# Patient Record
Sex: Male | Born: 1960 | Race: White | Hispanic: Yes | Marital: Married | Smoking: Former smoker
Health system: Southern US, Community
[De-identification: ages and names within clinical notes are randomized; demographics above are authoritative.]

## PROBLEM LIST (undated history)

## (undated) DIAGNOSIS — R519 Headache, unspecified: Secondary | ICD-10-CM

## (undated) DIAGNOSIS — E782 Mixed hyperlipidemia: Secondary | ICD-10-CM

## (undated) DIAGNOSIS — J189 Pneumonia, unspecified organism: Secondary | ICD-10-CM

## (undated) DIAGNOSIS — G8929 Other chronic pain: Secondary | ICD-10-CM

## (undated) DIAGNOSIS — I2510 Atherosclerotic heart disease of native coronary artery without angina pectoris: Secondary | ICD-10-CM

## (undated) DIAGNOSIS — H269 Unspecified cataract: Secondary | ICD-10-CM

## (undated) DIAGNOSIS — M549 Dorsalgia, unspecified: Secondary | ICD-10-CM

## (undated) DIAGNOSIS — R51 Headache: Secondary | ICD-10-CM

## (undated) DIAGNOSIS — Z992 Dependence on renal dialysis: Secondary | ICD-10-CM

## (undated) DIAGNOSIS — I6529 Occlusion and stenosis of unspecified carotid artery: Secondary | ICD-10-CM

## (undated) DIAGNOSIS — N186 End stage renal disease: Secondary | ICD-10-CM

## (undated) DIAGNOSIS — E119 Type 2 diabetes mellitus without complications: Secondary | ICD-10-CM

## (undated) DIAGNOSIS — I10 Essential (primary) hypertension: Secondary | ICD-10-CM

## (undated) HISTORY — DX: Unspecified cataract: H26.9

## (undated) HISTORY — DX: Essential (primary) hypertension: I10

## (undated) HISTORY — DX: Mixed hyperlipidemia: E78.2

## (undated) HISTORY — DX: Atherosclerotic heart disease of native coronary artery without angina pectoris: I25.10

## (undated) HISTORY — DX: Occlusion and stenosis of unspecified carotid artery: I65.29

## (undated) HISTORY — PX: CATARACT EXTRACTION W/ INTRAOCULAR LENS IMPLANT: SHX1309

---

## 2003-01-21 ENCOUNTER — Encounter: Admission: RE | Admit: 2003-01-21 | Discharge: 2003-04-21 | Payer: Self-pay | Admitting: Family Medicine

## 2004-01-20 ENCOUNTER — Inpatient Hospital Stay (HOSPITAL_COMMUNITY): Admission: EM | Admit: 2004-01-20 | Discharge: 2004-01-22 | Payer: Self-pay | Admitting: Emergency Medicine

## 2004-02-04 ENCOUNTER — Ambulatory Visit: Payer: Self-pay | Admitting: Internal Medicine

## 2004-06-17 ENCOUNTER — Emergency Department (HOSPITAL_COMMUNITY): Admission: EM | Admit: 2004-06-17 | Discharge: 2004-06-17 | Payer: Self-pay | Admitting: Emergency Medicine

## 2004-07-27 ENCOUNTER — Ambulatory Visit: Payer: Self-pay | Admitting: Internal Medicine

## 2004-09-06 ENCOUNTER — Ambulatory Visit (HOSPITAL_COMMUNITY): Admission: RE | Admit: 2004-09-06 | Discharge: 2004-09-07 | Payer: Self-pay | Admitting: Ophthalmology

## 2004-11-04 ENCOUNTER — Ambulatory Visit: Payer: Self-pay | Admitting: Internal Medicine

## 2005-06-05 HISTORY — PX: EYE SURGERY: SHX253

## 2009-01-13 ENCOUNTER — Emergency Department (HOSPITAL_COMMUNITY): Admission: EM | Admit: 2009-01-13 | Discharge: 2009-01-13 | Payer: Self-pay | Admitting: Family Medicine

## 2010-09-10 LAB — CBC
MCHC: 34.4 g/dL (ref 30.0–36.0)
RBC: 4.61 MIL/uL (ref 4.22–5.81)
RDW: 12.9 % (ref 11.5–15.5)
WBC: 7.4 10*3/uL (ref 4.0–10.5)

## 2010-09-10 LAB — GLUCOSE, CAPILLARY

## 2010-09-10 LAB — DIFFERENTIAL
Basophils Absolute: 0 10*3/uL (ref 0.0–0.1)
Basophils Relative: 0 % (ref 0–1)
Neutro Abs: 4.4 10*3/uL (ref 1.7–7.7)

## 2010-09-10 LAB — POCT I-STAT, CHEM 8
Calcium, Ion: 1.16 mmol/L (ref 1.12–1.32)
HCT: 45 % (ref 39.0–52.0)
Hemoglobin: 15.3 g/dL (ref 13.0–17.0)
Potassium: 5.2 mEq/L — ABNORMAL HIGH (ref 3.5–5.1)
Sodium: 130 mEq/L — ABNORMAL LOW (ref 135–145)

## 2010-10-21 NOTE — Op Note (Signed)
[name], Stephen Lane             ACCOUNT NO.:  [id number]   MEDICAL RECORD NO.:  [id number]          PATIENT TYPE:  OIB   LOCATION:  2550                         FACILITY:  [facility]   PHYSICIAN:  Chrystie Nose. Zigmund Daniel, M.D. 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   PROCEDURES:  Pars plana vitrectomy with membrane peel, retinal  photocoagulation and gas injection, right eye.   SURGEON:  Dr.  Tempie Hoist.   ASSISTANT:  Albert L. Lundquist, P.A.   ANESTHESIA:  General.   DETAILS:  Usual prep and drape.  Peritomies at 8, 10 and 2 o'clock. The 4 mm  angled infusion port anchored into place at 8 o'clock. The lighted pick and  the cutter placed  at 10 and 2 o'clock respectively.  Provisc placed on the  corneal surface. The Biom viewing system moved into place. The pars plana  vitrectomy was begun just behind the pseudophakos. Large white, thick  membranes consistent with old vitreous hemorrhage were encountered.  Membranes were carefully removed layer by layer until the retina became  visible.  Dark liquefied blood was seen covering the retinal surface.  This  was vacuumed with the [employer] [facility] brush and the vitreous cutter. The  vitrectomy was carried out into the peripheral vitreous area down to the  vitreous base for 360 degrees.   The macular area came into view and it was very distorted. It was elevated  and pulled into tight strands with several foci of vitreoretinal traction.  The macula was detached and the white fibrous scar tissue was pulling  traction detachment of the disk and the macular region.  These membranes  were carefully peeled and trimmed down to their stumps, in order to allow  the macula and the retina to lie back in place. The Endo-laser was then  positioned in the eye.  Then 607  burns were placed around the retinal  periphery with a power of 1000 milliwatts, 1000 microns each and 0.1  seconds. Washout procedure was performed and additional [employer] [facility]  brush vacuuming procedure was performed. Sterile air was infused in the eye  to reinflate the globe.   The instruments were removed from the eye and 9-0 nylon was used to close  the sclerotomy sites. They were tested and found to be tight. The  conjunctiva was reposited with 7-0 chromic suture. Polymyxin and gentamicin  were irrigated into Tenon's space. Atropine solution was applied.  Marcaine  was injected around the globe for postop pain. TobraDex, a patch and shield  were placed. The closing tension was 10 mm with the [place] tonometer.  The patient was awakened, taken to recovery in satisfactory condition.  Complications:  None. Operative time: 2 hours.     JDM/MEDQ  D:  09/06/2004  T:  09/06/2004  Job:  [id number]

## 2010-10-21 NOTE — Discharge Summary (Signed)
Stephen Lane, Stephen Lane                    ACCOUNT NO.:  [id number]   MEDICAL RECORD NO.:  [id number]                   PATIENT TYPE:  INP   LOCATION:  5010                                 FACILITY:  [facility]   PHYSICIAN:  Jay Schlichter, M.D.               DATE OF BIRTH:  03-Jul-1960   DATE OF ADMISSION:  01/20/2004  DATE OF DISCHARGE:  01/22/2004                                 DISCHARGE SUMMARY   DISCHARGE MEDICATIONS:  1. Glucotrol 10 mg q.a.m.  2. Protonix 40 mg q.a.m.  3. Maalox 15 mL after each meal.   MEDICAL PROBLEMS:  1. Diabetes mellitus.  2. Acute renal failure.  3. Dehydration.  4. Hypertension.  5. Decreased vision acuity.  6. Hyperproteinemia.   PROCEDURES:  EKG was normal.   PERTINENT LABORATORIES:  On admission his sodium was 130, potassium 4.7,  chloride 83, bicarbonate 28, BUN 16, creatinine 3.9, sugar 643.  His total  protein was elevated 9.6.  His HBA1C was 10.5.  Urine sodium was less than  10.  urinalysis was negative.   HISTORY AND PHYSICAL:  This is a 50 year old Spanish male patient presenting  to ED for complaint of nausea, vomiting, increased fatigue for one week  worsened since last night.  Complained of polyuria and nocturia since one  week.  No burning.  No pus or blood in urine.  Complained of epigastric pain  on eating associated with burning feeling.  Had stopped taking his medicines  since three months.  He was on Metformin because he ran out of it and did  not find time to buy it.  No complaint of chest pain, shortness of breath,  fevers, diarrhea, cough.   DISCHARGE LABORATORIES:  Sodium 134, potassium 3.8, chloride 101,  bicarbonate 28, sugar 165, BUN 10, creatinine 0.8, calcium 8.7.  CBC:  Hemoglobin 14.5, hematocrit 40.8, WBC 5.9, platelets 222.   ASSESSMENT/PLAN:  1. During the hospital stay, the patient first had hyperglycemia.  This was     due to uncontrolled diabetes mellitus.  The patient had stopped taking     Glucophage  three months before admission.  The patient was treated with     sliding scale of insulin and Glucotrol 10 mg was started in the hospital.     Plan is to continue him on 10 mg of Glucotrol, follow him up within one     week.  The patient is supposed to monitor his sugar in morning and before     bedtime.  He gets a new instrument to measure his sugar.  2. Acute renal failure.  This was secondary to his polyuria and vomiting.     The patient was treated with aggressive rehydration.  The patient quickly     recovered back to normal.  On discharge his BUN and creatinine were back     to normal.  3. Burning epigastric pain.  This is due to gastritis secondary to vomiting  which was treated with Protonix 40 mg     to be continued and Maalox 15 mL after each meal as required.  The     patient is to follow up in clinic on August 24 at 2 p.m. to see Dr. Caprice Kluver.  He is also to go for outpatient center clinic education classes     which he has requested and arrangement has been made for that.      Caprice Kluver, MD                           Jay Schlichter, M.D.    SP/MEDQ  D:  01/22/2004  T:  01/23/2004  Job:  [id number]

## 2012-04-13 ENCOUNTER — Encounter (HOSPITAL_COMMUNITY): Payer: Self-pay | Admitting: Physical Medicine and Rehabilitation

## 2012-04-13 ENCOUNTER — Emergency Department (HOSPITAL_COMMUNITY): Payer: Medicaid Other

## 2012-04-13 ENCOUNTER — Inpatient Hospital Stay (HOSPITAL_COMMUNITY)
Admission: EM | Admit: 2012-04-13 | Discharge: 2012-04-17 | DRG: 247 | Disposition: A | Discharge status: Home / Self Care | Payer: Medicaid Other | Attending: Internal Medicine | Admitting: Internal Medicine

## 2012-04-13 DIAGNOSIS — R51 Headache: Secondary | ICD-10-CM

## 2012-04-13 DIAGNOSIS — K219 Gastro-esophageal reflux disease without esophagitis: Secondary | ICD-10-CM

## 2012-04-13 DIAGNOSIS — D649 Anemia, unspecified: Secondary | ICD-10-CM

## 2012-04-13 DIAGNOSIS — R9431 Abnormal electrocardiogram [ECG] [EKG]: Secondary | ICD-10-CM

## 2012-04-13 DIAGNOSIS — Z23 Encounter for immunization: Secondary | ICD-10-CM

## 2012-04-13 DIAGNOSIS — R739 Hyperglycemia, unspecified: Secondary | ICD-10-CM

## 2012-04-13 DIAGNOSIS — I2510 Atherosclerotic heart disease of native coronary artery without angina pectoris: Principal | ICD-10-CM | POA: Diagnosis present

## 2012-04-13 DIAGNOSIS — Z955 Presence of coronary angioplasty implant and graft: Secondary | ICD-10-CM

## 2012-04-13 DIAGNOSIS — IMO0001 Reserved for inherently not codable concepts without codable children: Secondary | ICD-10-CM | POA: Diagnosis present

## 2012-04-13 DIAGNOSIS — R079 Chest pain, unspecified: Secondary | ICD-10-CM

## 2012-04-13 DIAGNOSIS — E119 Type 2 diabetes mellitus without complications: Secondary | ICD-10-CM

## 2012-04-13 DIAGNOSIS — E785 Hyperlipidemia, unspecified: Secondary | ICD-10-CM

## 2012-04-13 DIAGNOSIS — I10 Essential (primary) hypertension: Secondary | ICD-10-CM | POA: Diagnosis present

## 2012-04-13 DIAGNOSIS — I200 Unstable angina: Secondary | ICD-10-CM

## 2012-04-13 DIAGNOSIS — E781 Pure hyperglyceridemia: Secondary | ICD-10-CM | POA: Diagnosis present

## 2012-04-13 LAB — COMPREHENSIVE METABOLIC PANEL
ALT: 17 U/L (ref 0–53)
Alkaline Phosphatase: 145 U/L — ABNORMAL HIGH (ref 39–117)
Calcium: 8.7 mg/dL (ref 8.4–10.5)
Creatinine, Ser: 1.28 mg/dL (ref 0.50–1.35)
Sodium: 129 mEq/L — ABNORMAL LOW (ref 135–145)
Total Bilirubin: 0.3 mg/dL (ref 0.3–1.2)

## 2012-04-13 LAB — CBC WITH DIFFERENTIAL/PLATELET
Basophils Absolute: 0 10*3/uL (ref 0.0–0.1)
Basophils Relative: 1 % (ref 0–1)
Lymphs Abs: 2.5 10*3/uL (ref 0.7–4.0)
MCH: 31.1 pg (ref 26.0–34.0)
MCV: 86 fL (ref 78.0–100.0)
Monocytes Absolute: 0.3 10*3/uL (ref 0.1–1.0)
Monocytes Relative: 4 % (ref 3–12)
Neutro Abs: 3.7 10*3/uL (ref 1.7–7.7)
Neutrophils Relative %: 53 % (ref 43–77)
RBC: 4.08 MIL/uL — ABNORMAL LOW (ref 4.22–5.81)
WBC: 7 10*3/uL (ref 4.0–10.5)

## 2012-04-13 LAB — POCT I-STAT TROPONIN I: Troponin i, poc: 0.07 ng/mL (ref 0.00–0.08)

## 2012-04-13 LAB — RETICULOCYTES
RBC.: 3.79 MIL/uL — ABNORMAL LOW (ref 4.22–5.81)
Retic Ct Pct: 1.5 % (ref 0.4–3.1)

## 2012-04-13 MED ORDER — ASPIRIN 300 MG RE SUPP
300.0000 mg | RECTAL | Status: AC
  Filled 2012-04-13: qty 1

## 2012-04-13 MED ORDER — DIPHENHYDRAMINE HCL 50 MG/ML IJ SOLN
25.0000 mg | Freq: Once | INTRAMUSCULAR | Status: AC
  Administered 2012-04-13: 25 mg via INTRAVENOUS
  Filled 2012-04-13: qty 1

## 2012-04-13 MED ORDER — SODIUM CHLORIDE 0.9 % IJ SOLN: 3.0000 mL | INTRAMUSCULAR | Status: DC | PRN

## 2012-04-13 MED ORDER — NITROGLYCERIN 2 % TD OINT
0.5000 [in_us] | TOPICAL_OINTMENT | Freq: Three times a day (TID) | TRANSDERMAL | Status: DC
  Administered 2012-04-13 – 2012-04-14 (×2): 0.5 [in_us] via TOPICAL
  Filled 2012-04-13: qty 30

## 2012-04-13 MED ORDER — METOCLOPRAMIDE HCL 5 MG/ML IJ SOLN
10.0000 mg | Freq: Once | INTRAMUSCULAR | Status: AC
  Administered 2012-04-13: 10 mg via INTRAVENOUS
  Filled 2012-04-13: qty 2

## 2012-04-13 MED ORDER — NITROGLYCERIN 0.4 MG SL SUBL: 0.4000 mg | SUBLINGUAL_TABLET | SUBLINGUAL | Status: DC | PRN

## 2012-04-13 MED ORDER — SODIUM CHLORIDE 0.9 % IJ SOLN
3.0000 mL | Freq: Two times a day (BID) | INTRAMUSCULAR | Status: DC
  Administered 2012-04-14 – 2012-04-16 (×4): 3 mL via INTRAVENOUS

## 2012-04-13 MED ORDER — ONDANSETRON HCL 4 MG/2ML IJ SOLN: 4.0000 mg | Freq: Four times a day (QID) | INTRAMUSCULAR | Status: DC | PRN

## 2012-04-13 MED ORDER — SODIUM CHLORIDE 0.9 % IV SOLN: 250.0000 mL | INTRAVENOUS | Status: DC | PRN

## 2012-04-13 MED ORDER — ATORVASTATIN CALCIUM 20 MG PO TABS
20.0000 mg | ORAL_TABLET | Freq: Every day | ORAL | Status: DC
  Administered 2012-04-14 – 2012-04-16 (×3): 20 mg via ORAL
  Filled 2012-04-13 (×5): qty 1

## 2012-04-13 MED ORDER — SODIUM CHLORIDE 0.9 % IV BOLUS (SEPSIS)
1000.0000 mL | Freq: Once | INTRAVENOUS | Status: AC
  Administered 2012-04-13: 1000 mL via INTRAVENOUS

## 2012-04-13 MED ORDER — ASPIRIN 81 MG PO CHEW
324.0000 mg | CHEWABLE_TABLET | Freq: Once | ORAL | Status: AC
  Administered 2012-04-13: 324 mg via ORAL
  Filled 2012-04-13: qty 4

## 2012-04-13 MED ORDER — ASPIRIN 81 MG PO CHEW: 324.0000 mg | CHEWABLE_TABLET | ORAL | Status: AC

## 2012-04-13 MED ORDER — SODIUM CHLORIDE 0.9 % IV SOLN
INTRAVENOUS | Status: AC
  Administered 2012-04-13: 23:00:00 via INTRAVENOUS

## 2012-04-13 MED ORDER — INSULIN ASPART 100 UNIT/ML ~~LOC~~ SOLN
5.0000 [IU] | Freq: Once | SUBCUTANEOUS | Status: AC
  Administered 2012-04-13: 5 [IU] via INTRAVENOUS
  Filled 2012-04-13: qty 1

## 2012-04-13 MED ORDER — INSULIN ASPART 100 UNIT/ML ~~LOC~~ SOLN
0.0000 [IU] | Freq: Three times a day (TID) | SUBCUTANEOUS | Status: DC
  Administered 2012-04-14: 5 [IU] via SUBCUTANEOUS
  Administered 2012-04-14: 3 [IU] via SUBCUTANEOUS
  Administered 2012-04-15: 2 [IU] via SUBCUTANEOUS
  Administered 2012-04-15: 3 [IU] via SUBCUTANEOUS
  Administered 2012-04-16 (×2): 1 [IU] via SUBCUTANEOUS
  Administered 2012-04-16: 2 [IU] via SUBCUTANEOUS

## 2012-04-13 MED ORDER — ASPIRIN EC 81 MG PO TBEC
81.0000 mg | DELAYED_RELEASE_TABLET | Freq: Every day | ORAL | Status: DC
  Administered 2012-04-14 – 2012-04-17 (×3): 81 mg via ORAL
  Filled 2012-04-13 (×4): qty 1

## 2012-04-13 MED ORDER — ACETAMINOPHEN 325 MG PO TABS
650.0000 mg | ORAL_TABLET | ORAL | Status: DC | PRN
  Administered 2012-04-13 – 2012-04-14 (×3): 650 mg via ORAL
  Filled 2012-04-13 (×2): qty 2

## 2012-04-13 MED ORDER — METOPROLOL TARTRATE 12.5 MG HALF TABLET
12.5000 mg | ORAL_TABLET | Freq: Two times a day (BID) | ORAL | Status: DC
  Administered 2012-04-13 – 2012-04-16 (×7): 12.5 mg via ORAL
  Filled 2012-04-13 (×10): qty 1

## 2012-04-13 MED ORDER — KETOROLAC TROMETHAMINE 30 MG/ML IJ SOLN
30.0000 mg | Freq: Once | INTRAMUSCULAR | Status: AC
  Administered 2012-04-13: 30 mg via INTRAVENOUS
  Filled 2012-04-13: qty 1

## 2012-04-13 MED ORDER — ENOXAPARIN SODIUM 80 MG/0.8ML ~~LOC~~ SOLN
70.0000 mg | Freq: Two times a day (BID) | SUBCUTANEOUS | Status: DC
  Administered 2012-04-14: 70 mg via SUBCUTANEOUS
  Filled 2012-04-13 (×3): qty 0.8

## 2012-04-13 NOTE — ED Notes (Signed)
Pt presents to department for evaluation of diffuse chest pain and headache. Ongoing since Wednesday. Pt states pain has progressively become worse since. 7/10 pain at the time. Respirations unlabored. He is conscious alert and oriented x4. Speaks some english.

## 2012-04-13 NOTE — Consult Note (Signed)
Reason for Consult: Chest pain  Referring Physician: Derrill Kay, [facility]  [address] Quincy Simmonds is an 51 y.o. male.   HPI: 51 y/o male with a PMH of HTN, DM and gastritis who is currently been seen in consultation with Dr. Shanon Brow for evaluation of chest pain.  The patient is Spanish speaking and majority of history was translated by English-speaking family members. He complains of 1 week history of intermittent exertional chest pain that he describes as a burning pain that typically occurs with activities like moving branches of trees.  The pain usually last about 15 minutes before spontaneous relief.  He denies radiation, shortness of breath, diaphoresis, nausea or vomiting.  He also denies PND, orthopnea, palpitation or tobacco use.  In the ED, his ECG showed sinus rhythm (79bpm), LVH by voltage criteria and T-wave inversion in leads III, aVF, V3-V6 which are new when compared to prior ECG from April of 2006. His first set of cardiac marker is negative, and the patient is currently chest pain free.     Past Medical History  Diagnosis Date  . Diabetes mellitus without complication     No past surgical history on file.  History reviewed. No pertinent family history.  Social History:  reports that he has never smoked. He does not have any smokeless tobacco history on file. He reports that he does not drink alcohol or use illicit drugs.  Allergies: No Known Allergies  Medications: From 2012 D/C summary  1. Glucotrol 10 mg q am 2. Protonix 40 mg q am    Results for orders placed during the hospital encounter of 04/13/12 (from the past 48 hour(s))  CBC WITH DIFFERENTIAL     Status: Abnormal   Collection Time   04/13/12  4:20 PM      Component Value Range Comment   WBC 7.0  4.0 - 10.5 K/uL    RBC 4.08 (*) 4.22 - 5.81 MIL/uL    Hemoglobin 12.7 (*) 13.0 - 17.0 g/dL    HCT 35.1 (*) 39.0 - 52.0 %    MCV 86.0  78.0 - 100.0 fL    MCH 31.1  26.0 - 34.0 pg    MCHC 36.2 (*) 30.0 -  36.0 g/dL    RDW 12.7  11.5 - 15.5 %    Platelets 264  150 - 400 K/uL    Neutrophils Relative 53  43 - 77 %    Neutro Abs 3.7  1.7 - 7.7 K/uL    Lymphocytes Relative 36  12 - 46 %    Lymphs Abs 2.5  0.7 - 4.0 K/uL    Monocytes Relative 4  3 - 12 %    Monocytes Absolute 0.3  0.1 - 1.0 K/uL    Eosinophils Relative 6 (*) 0 - 5 %    Eosinophils Absolute 0.4  0.0 - 0.7 K/uL    Basophils Relative 1  0 - 1 %    Basophils Absolute 0.0  0.0 - 0.1 K/uL   COMPREHENSIVE METABOLIC PANEL     Status: Abnormal   Collection Time   04/13/12  4:20 PM      Component Value Range Comment   Sodium 129 (*) 135 - 145 mEq/L    Potassium 3.9  3.5 - 5.1 mEq/L HEMOLYSIS AT THIS LEVEL MAY AFFECT RESULT   Chloride 92 (*) 96 - 112 mEq/L    CO2 29  19 - 32 mEq/L    Glucose, Bld 487 (*) 70 - 99 mg/dL  BUN 25 (*) 6 - 23 mg/dL    Creatinine, Ser 1.28  0.50 - 1.35 mg/dL    Calcium 8.7  8.4 - 10.5 mg/dL    Total Protein 6.7  6.0 - 8.3 g/dL    Albumin 2.9 (*) 3.5 - 5.2 g/dL    AST 25  0 - 37 U/L    ALT 17  0 - 53 U/L    Alkaline Phosphatase 145 (*) 39 - 117 U/L    Total Bilirubin 0.3  0.3 - 1.2 mg/dL    GFR calc non Af Amer 63 (*) >90 mL/min    GFR calc Af Amer 73 (*) >90 mL/min   POCT I-STAT TROPONIN I     Status: Normal   Collection Time   04/13/12  4:35 PM      Component Value Range Comment   Troponin i, poc 0.07  0.00 - 0.08 ng/mL    Comment 3            POCT I-STAT TROPONIN I     Status: Normal   Collection Time   04/13/12  8:16 PM      Component Value Range Comment   Troponin i, poc 0.06  0.00 - 0.08 ng/mL    Comment 3            GLUCOSE, CAPILLARY     Status: Abnormal   Collection Time   04/13/12  8:32 PM      Component Value Range Comment   Glucose-Capillary 389 (*) 70 - 99 mg/dL   RETICULOCYTES     Status: Abnormal   Collection Time   04/13/12  9:04 PM      Component Value Range Comment   Retic Ct Pct 1.5  0.4 - 3.1 %    RBC. 3.79 (*) 4.22 - 5.81 MIL/uL    Retic Count, Manual 56.9  19.0 -  186.0 K/uL     Dg Chest 2 View  04/13/2012  *RADIOLOGY REPORT*  Clinical Data: Chest pain.  Headache.  CHEST - 2 VIEW  Comparison: 09/06/2004  Findings: The film is made with shallow lung inflation.  Heart size is probably normal.  There are no focal consolidations or pleural effusions.  Mild perihilar bronchitic changes are present.  IMPRESSION:  1.  Shallow inflation. 2. No evidence for acute cardiopulmonary abnormality.   Original Report Authenticated By: Nolon Nations, M.D.     Review of Systems  Constitutional: Negative for fever, chills, weight loss, malaise/fatigue and diaphoresis.  HENT: Negative for hearing loss, ear pain, nosebleeds, congestion, neck pain, tinnitus and ear discharge.   Eyes: Negative for pain, discharge and redness.  Respiratory: Positive for shortness of breath. Negative for cough, hemoptysis and sputum production.   Cardiovascular: Positive for chest pain. Negative for palpitations, orthopnea, claudication and leg swelling.  Gastrointestinal: Positive for heartburn. Negative for nausea, vomiting, abdominal pain, diarrhea and constipation.  Genitourinary: Negative for dysuria, urgency, frequency and hematuria.  Musculoskeletal: Negative for myalgias, back pain and joint pain.  Skin: Negative for itching and rash.  Neurological: Negative for dizziness, tingling, tremors, sensory change, seizures, loss of consciousness, weakness and headaches.  Psychiatric/Behavioral: Negative for hallucinations.   Blood pressure 140/87, pulse 81, temperature 97.8 F (36.6 C), temperature source Oral, resp. rate 18, SpO2 99.00%. Physical Exam  Constitutional: He is oriented to person, place, and time. He appears well-developed and well-nourished. No distress.  HENT:  Head: Normocephalic.  Eyes: Conjunctivae normal and EOM are normal. Pupils are equal, round, and reactive  to light. Right eye exhibits no discharge. Left eye exhibits no discharge.  Neck: Normal range of motion. No  JVD present.  Cardiovascular: Normal rate, regular rhythm and normal heart sounds.  Exam reveals no gallop and no friction rub.   No murmur heard. Respiratory: Effort normal and breath sounds normal. No respiratory distress. He has no wheezes. He has no rales.  GI: Soft. Bowel sounds are normal. He exhibits no distension. There is no rebound and no guarding.  Musculoskeletal: He exhibits no edema and no tenderness.  Neurological: He is alert and oriented to person, place, and time.  Skin: No rash noted. He is not diaphoretic. There is erythema.  Psychiatric: He has a normal mood and affect.    Assessment/Plan:  1. Chest pain 2. DM 3. GERD  His chest pain has some typical and atypical features. It is burning in nature which raises the possibility that it could be GERD-related.  Since he has DM, I recommend that he should be observed on telemetry, serial cardiac markers to rule out myocardial infarction, fasting lipids with LFTs in am, ACEI for renal protection, Protonix in case his chest pain is due to GERD and NPO for a nuclear stress test on Monday.  Bronco Mcgrory E 04/13/2012, 9:56 PM

## 2012-04-13 NOTE — Progress Notes (Signed)
ANTICOAGULATION CONSULT NOTE - Initial Consult  Pharmacy Consult for lovenox Indication: chest pain/ACS  No Known Allergies  Patient Measurements:   Heparin Dosing Weight: ?   Vital Signs: Temp: 98.4 F (36.9 C) (11/09 2157) Temp src: Oral (11/09 2157) BP: 136/90 mmHg (11/09 2157) Pulse Rate: 64  (11/09 2157)  Labs:  Basename 04/13/12 1620  HGB 12.7*  HCT 35.1*  PLT 264  APTT --  LABPROT --  INR --  HEPARINUNFRC --  CREATININE 1.28  CKTOTAL --  CKMB --  TROPONINI --    CrCl is unknown because there is no height on file for the current visit.   Medical History: Past Medical History  Diagnosis Date  . Diabetes mellitus without complication     Medications:  No prescriptions prior to admission    Assessment: 51 yo male with DM and HTN with CP. Lovenox for full anti-coagulation. Stress test monday  Goal of Therapy:  Anti-Xa level 0.6-1.2 units/ml 4hrs after LMWH dose given Monitor platelets by anticoagulation protocol: Yes   Plan:  lovenox $Remove'70mg'dKHKUtH$  q12hours  Cbc q72 hours while on lovenox   Curlene Dolphin 04/13/2012,10:46 PM

## 2012-04-13 NOTE — ED Provider Notes (Signed)
History     CSN: [id number]  Arrival date & time 04/13/12  1601   First MD Initiated Contact with Patient 04/13/12 1656      Chief Complaint  Patient presents with  . Chest Pain  . Headache    (Consider location/radiation/quality/duration/timing/severity/associated sxs/prior treatment) Patient is a 51 y.o. male presenting with chest pain and headaches. The history is provided by the patient.  Chest Pain    Headache   He has been having anterior chest pain and a bifrontal headache for the last 4 days. Headache is dull and not affected by anything. His pain is worse when he has noticed something heavy. There is no associated dyspnea, nausea, visual change, diaphoresis. He has not taken anything for his pain. There is no radiation of pain. He has difficulty describing the pain. He does have a history of diabetes but is not on any medication.  Past Medical History  Diagnosis Date  . Diabetes mellitus without complication     No past surgical history on file.  History reviewed. No pertinent family history.  History  Substance Use Topics  . Smoking status: Never Smoker   . Smokeless tobacco: Not on file  . Alcohol Use: No      Review of Systems  Cardiovascular: Positive for chest pain.  Neurological: Positive for headaches.  All other systems reviewed and are negative.    Allergies  Review of patient's allergies indicates no known allergies.  Home Medications  No current outpatient prescriptions on file.  BP 140/87  Pulse 81  Temp 97.8 F (36.6 C) (Oral)  Resp 18  SpO2 99%  Physical Exam  Nursing note and vitals reviewed. 51 year old male, resting comfortably and in no acute distress. Vital signs are normal. Oxygen saturation is 99%, which is normal. Head is normocephalic and atraumatic. PERRLA, EOMI. Oropharynx is clear. Fundi show no hemorrhage, exudate, or papilledema. Neck is nontender and supple without adenopathy or JVD. Back is nontender and there  is no CVA tenderness. Lungs are clear without rales, wheezes, or rhonchi. Chest has mild tenderness anteriorly. Heart has regular rate and rhythm without murmur. Abdomen is soft, flat, nontender without masses or hepatosplenomegaly and peristalsis is normoactive. Extremities have no cyanosis or edema, full range of motion is present. Skin is warm and dry without rash. Neurologic: Mental status is normal, cranial nerves are intact, there are no motor or sensory deficits.   ED Course  Procedures (including critical care time)  Results for orders placed during the hospital encounter of 04/13/12  CBC WITH DIFFERENTIAL      Component Value Range   WBC 7.0  4.0 - 10.5 K/uL   RBC 4.08 (*) 4.22 - 5.81 MIL/uL   Hemoglobin 12.7 (*) 13.0 - 17.0 g/dL   HCT 35.1 (*) 39.0 - 52.0 %   MCV 86.0  78.0 - 100.0 fL   MCH 31.1  26.0 - 34.0 pg   MCHC 36.2 (*) 30.0 - 36.0 g/dL   RDW 12.7  11.5 - 15.5 %   Platelets 264  150 - 400 K/uL   Neutrophils Relative 53  43 - 77 %   Neutro Abs 3.7  1.7 - 7.7 K/uL   Lymphocytes Relative 36  12 - 46 %   Lymphs Abs 2.5  0.7 - 4.0 K/uL   Monocytes Relative 4  3 - 12 %   Monocytes Absolute 0.3  0.1 - 1.0 K/uL   Eosinophils Relative 6 (*) 0 - 5 %  Eosinophils Absolute 0.4  0.0 - 0.7 K/uL   Basophils Relative 1  0 - 1 %   Basophils Absolute 0.0  0.0 - 0.1 K/uL  COMPREHENSIVE METABOLIC PANEL      Component Value Range   Sodium 129 (*) 135 - 145 mEq/L   Potassium 3.9  3.5 - 5.1 mEq/L   Chloride 92 (*) 96 - 112 mEq/L   CO2 29  19 - 32 mEq/L   Glucose, Bld 487 (*) 70 - 99 mg/dL   BUN 25 (*) 6 - 23 mg/dL   Creatinine, Ser 6.71  0.50 - 1.35 mg/dL   Calcium 8.7  8.4 - 77.9 mg/dL   Total Protein 6.7  6.0 - 8.3 g/dL   Albumin 2.9 (*) 3.5 - 5.2 g/dL   AST 25  0 - 37 U/L   ALT 17  0 - 53 U/L   Alkaline Phosphatase 145 (*) 39 - 117 U/L   Total Bilirubin 0.3  0.3 - 1.2 mg/dL   GFR calc non Af Amer 63 (*) >90 mL/min   GFR calc Af Amer 73 (*) >90 mL/min  POCT I-STAT  TROPONIN I      Component Value Range   Troponin i, poc 0.07  0.00 - 0.08 ng/mL   Comment 3           GLUCOSE, CAPILLARY      Component Value Range   Glucose-Capillary 389 (*) 70 - 99 mg/dL  POCT I-STAT TROPONIN I      Component Value Range   Troponin i, poc 0.06  0.00 - 0.08 ng/mL   Comment 3            Dg Chest 2 View  04/13/2012  *RADIOLOGY REPORT*  Clinical Data: Chest pain.  Headache.  CHEST - 2 VIEW  Comparison: 09/06/2004  Findings: The film is made with shallow lung inflation.  Heart size is probably normal.  There are no focal consolidations or pleural effusions.  Mild perihilar bronchitic changes are present.  IMPRESSION:  1.  Shallow inflation. 2. No evidence for acute cardiopulmonary abnormality.   Original Report Authenticated By: Norva Pavlov, M.D.      Date: 04/13/2012  Rate: 79  Rhythm: normal sinus rhythm  QRS Axis: normal  Intervals: normal  ST/T Wave abnormalities: T wave inversions in the inferior and anterolateral leads  Conduction Disutrbances:none  Narrative Interpretation: Left ventricular hypertrophy, T-wave inversions which could be related to LVH or ischemia. When compared with ECG of 09/06/2004, voltage criteria for left ventricular hypertrophy is now present, T wave inversions in inferior and anterolateral leads is now present.  Old EKG Reviewed: changes noted    1. Chest pain   2. Headache   3. Hyperglycemia   4. Diabetes mellitus without complication       MDM  Chest pain of uncertain cause. Headache of uncertain cause. ECG does show new T-wave inversions which could be related to left ventricular hypertrophy but could be due to ischemia. Hyponatremia is largely due to his hyperglycemia. He is hyperglycemia will be treated with IV fluids and may need insulin as well. Given the ECG changes, he likely will need admission for further cardiac evaluation and will also need to be started on medication to control his blood sugars.  He was given IV  fluids, IV diphenhydramine, IV ketorolac, and IV metoclopramide. Following this, headache and chest pain are completely resolved. Case is discussed with Dr. Onalee Hua of [facility] who agrees to admit the patient and  requests cardiology consultation be obtained.      Delora Fuel, MD 16/96/78 [id number]

## 2012-04-13 NOTE — H&P (Signed)
  Chief Complaint:  CP  HPI: 51 yo male with h/o dm but does not take med for it due to lack of insurance comes in with one wk of sscp no radiation with assoicated n/v/diaphoresis that waxes/wanes.  No fevers.  No cough.  No le edema.  No prior h/o cad.  No prior cardiac w/u.  Cp free now.    Review of Systems:  O/w neg  Past Medical History: Past Medical History  Diagnosis Date  . Diabetes mellitus without complication     Medications: Prior to Admission medications   Not on File    Allergies:  No Known Allergies  Social History:  reports that he has never smoked. He does not have any smokeless tobacco history on file. He reports that he does not drink alcohol or use illicit drugs.  Family History: History reviewed. No pertinent family history.  Physical Exam: Filed Vitals:   04/13/12 1607  BP: 140/87  Pulse: 81  Temp: 97.8 F (36.6 C)  TempSrc: Oral  Resp: 18  SpO2: 99%   General appearance: alert, cooperative and no distress Neck: no JVD and supple, symmetrical, trachea midline Lungs: clear to auscultation bilaterally Heart: regular rate and rhythm, S1, S2 normal, no murmur, click, rub or gallop Abdomen: soft, non-tender; bowel sounds normal; no masses,  no organomegaly Extremities: extremities normal, atraumatic, no cyanosis or edema Pulses: 2+ and symmetric Skin: Skin color, texture, turgor normal. No rashes or lesions Neurologic: Grossly normal    Labs on Admission:   Basename 04/13/12 1620  NA 129*  K 3.9  CL 92*  CO2 29  GLUCOSE 487*  BUN 25*  CREATININE 1.28  CALCIUM 8.7  MG --  PHOS --    Basename 04/13/12 1620  AST 25  ALT 17  ALKPHOS 145*  BILITOT 0.3  PROT 6.7  ALBUMIN 2.9*    Basename 04/13/12 1620  WBC 7.0  NEUTROABS 3.7  HGB 12.7*  HCT 35.1*  MCV 86.0  PLT 264    Radiological Exams on Admission: Dg Chest 2 View  04/13/2012  *RADIOLOGY REPORT*  Clinical Data: Chest pain.  Headache.  CHEST - 2 VIEW  Comparison:  09/06/2004  Findings: The film is made with shallow lung inflation.  Heart size is probably normal.  There are no focal consolidations or pleural effusions.  Mild perihilar bronchitic changes are present.  IMPRESSION:  1.  Shallow inflation. 2. No evidence for acute cardiopulmonary abnormality.   Original Report Authenticated By: Nolon Nations, M.D.     Assessment/Plan 51 yo male uncont dm with cp/usa  Principal Problem:  *Chest pain Active Problems:  Diabetes mellitus without complication  EKG abnormalities  Normocytic anemia  Cards consult.  acs pathway until rules out.  Further w/u per cards with stress testing vs cath.  Ssi.  Needs to be set up with pcp at d/c.   Tmya Wigington A 04/13/2012, 9:01 PM

## 2012-04-14 DIAGNOSIS — R7309 Other abnormal glucose: Secondary | ICD-10-CM

## 2012-04-14 LAB — IRON AND TIBC
Saturation Ratios: 36 % (ref 20–55)
Saturation Ratios: 33 % (ref 20–55)
TIBC: 210 ug/dL — ABNORMAL LOW (ref 215–435)
TIBC: 221 ug/dL (ref 215–435)
UIBC: 134 ug/dL (ref 125–400)

## 2012-04-14 LAB — CK TOTAL AND CKMB (NOT AT ARMC)
CK, MB: 3.5 ng/mL (ref 0.3–4.0)
CK, MB: 4.4 ng/mL — ABNORMAL HIGH (ref 0.3–4.0)
Relative Index: INVALID (ref 0.0–2.5)
Relative Index: 3.4 — ABNORMAL HIGH (ref 0.0–2.5)
Total CK: 102 U/L (ref 7–232)
Total CK: 124 U/L (ref 7–232)

## 2012-04-14 LAB — GLUCOSE, CAPILLARY: Glucose-Capillary: 252 mg/dL — ABNORMAL HIGH (ref 70–99)

## 2012-04-14 LAB — CBC
Hemoglobin: 11.1 g/dL — ABNORMAL LOW (ref 13.0–17.0)
MCH: 30.4 pg (ref 26.0–34.0)
MCHC: 35.7 g/dL (ref 30.0–36.0)
MCV: 85.2 fL (ref 78.0–100.0)
Platelets: 215 10*3/uL (ref 150–400)

## 2012-04-14 LAB — BASIC METABOLIC PANEL
BUN: 22 mg/dL (ref 6–23)
Chloride: 104 mEq/L (ref 96–112)
Creatinine, Ser: 1.3 mg/dL (ref 0.50–1.35)
GFR calc Af Amer: 71 mL/min — ABNORMAL LOW (ref >90)
Glucose, Bld: 345 mg/dL — ABNORMAL HIGH (ref 70–99)

## 2012-04-14 LAB — VITAMIN B12
Vitamin B-12: 796 pg/mL (ref 211–911)
Vitamin B-12: 777 pg/mL (ref 211–911)

## 2012-04-14 LAB — LIPID PANEL
Cholesterol: 228 mg/dL — ABNORMAL HIGH (ref 0–200)
HDL: 31 mg/dL — ABNORMAL LOW (ref >39)
LDL Cholesterol: UNDETERMINED mg/dL (ref 0–99)
Triglycerides: 634 mg/dL — ABNORMAL HIGH (ref <150)
VLDL: UNDETERMINED mg/dL (ref 0–40)

## 2012-04-14 LAB — FERRITIN: Ferritin: 327 ng/mL — ABNORMAL HIGH (ref 22–322)

## 2012-04-14 MED ORDER — NITROGLYCERIN 2 % TD OINT
0.5000 [in_us] | TOPICAL_OINTMENT | Freq: Four times a day (QID) | TRANSDERMAL | Status: DC
  Administered 2012-04-14 – 2012-04-17 (×11): 0.5 [in_us] via TOPICAL
  Filled 2012-04-14 (×2): qty 30

## 2012-04-14 MED ORDER — INFLUENZA VIRUS VACC SPLIT PF IM SUSP
0.5000 mL | INTRAMUSCULAR | Status: AC
  Administered 2012-04-15: 0.5 mL via INTRAMUSCULAR
  Filled 2012-04-14: qty 0.5

## 2012-04-14 MED ORDER — HEPARIN (PORCINE) IN NACL 100-0.45 UNIT/ML-% IJ SOLN
900.0000 [IU]/h | INTRAMUSCULAR | Status: DC
  Administered 2012-04-14: 1000 [IU]/h via INTRAVENOUS
  Administered 2012-04-15: 900 [IU]/h via INTRAVENOUS
  Filled 2012-04-14 (×2): qty 250

## 2012-04-14 MED ORDER — PANTOPRAZOLE SODIUM 40 MG PO TBEC
40.0000 mg | DELAYED_RELEASE_TABLET | Freq: Every day | ORAL | Status: DC
  Administered 2012-04-14 – 2012-04-17 (×4): 40 mg via ORAL
  Filled 2012-04-14 (×4): qty 1

## 2012-04-14 MED ORDER — PNEUMOCOCCAL VAC POLYVALENT 25 MCG/0.5ML IJ INJ
0.5000 mL | INJECTION | INTRAMUSCULAR | Status: AC
  Administered 2012-04-15: 0.5 mL via INTRAMUSCULAR
  Filled 2012-04-14: qty 0.5

## 2012-04-14 MED ORDER — ALUM & MAG HYDROXIDE-SIMETH 200-200-20 MG/5ML PO SUSP
30.0000 mL | ORAL | Status: DC | PRN
  Filled 2012-04-14: qty 30

## 2012-04-14 NOTE — Progress Notes (Signed)
Patient ID: Stephen Lane  male  [id number]:[id number]    DOB: Oct 10, 1960    DOA: 04/13/2012  PCP: William Hamburger, MD  Assessment/Plan: Principal Problem:  *Chest pain/ unstable angina with abnormal EKG - Discussed in detail with the Dr. Lovena Le, recommended left heart catheterization in AM - Continue aspirin, Lipitor, beta blocker, nitroglycerin, statins, full dose lovenox  Active Problems:  Diabetes mellitus without complication - Continue sliding scale insulin, hemoglobin A1c  Normocytic anemia - anemia panel  DVT Prophylaxis: on therapeutic lovenox   Code Status: FC  Disposition: await cath    Subjective: C/o CP earlier this AM, 4/5, both chest, no shortness of breath, nausea, vomiting, diaphoresis, palpitations.  Objective: Weight change:  No intake or output data in the 24 hours ending 04/14/12 1203 Blood pressure 159/93, pulse 72, temperature 97.5 F (36.4 C), temperature source Oral, resp. rate 12, height $RemoveBe'5\' 2"'BqzaJPEmB$  (1.575 m), weight 69.4 kg (153 lb), SpO2 100.00%.  Physical Exam: General: Alert and awake, oriented x3, not in any acute distress. HEENT: anicteric sclera, pupils reactive to light and accommodation, EOMI CVS: S1-S2 clear, no murmur rubs or gallops Chest: clear to auscultation bilaterally, no wheezing, rales or rhonchi Abdomen: soft nontender, nondistended, normal bowel sounds, no organomegaly Extremities: no cyanosis, clubbing or edema noted bilaterally Neuro: Cranial nerves II-XII intact, no focal neurological deficits  Lab Results: Basic Metabolic Panel:  Lab 24/23/53 0742 04/13/12 1620  NA 137 129*  K 4.1 3.9  CL 104 92*  CO2 28 29  GLUCOSE 345* 487*  BUN 22 25*  CREATININE 1.30 1.28  CALCIUM 7.9* 8.7  MG -- --  PHOS -- --   Liver Function Tests:  Lab 04/13/12 1620  AST 25  ALT 17  ALKPHOS 145*  BILITOT 0.3  PROT 6.7  ALBUMIN 2.9*   CBC:  Lab 04/14/12 0544 04/13/12 1620  WBC 7.0 7.0  NEUTROABS -- 3.7  HGB 11.1* 12.7*  HCT 31.1*  35.1*  MCV 85.2 86.0  PLT 215 264   Cardiac Enzymes:  Lab 04/14/12 1012 04/14/12 0742 04/14/12 0544 04/13/12 2240  CKTOTAL -- 81 -- --  CKMB -- 3.1 -- --  CKMBINDEX -- -- -- --  TROPONINI <0.30 -- <0.30 <0.30   BNP: No components found with this basename: POCBNP:2 CBG:  Lab 04/14/12 0742 04/13/12 2235 04/13/12 2032  GLUCAP 252* 193* 389*     Micro Results: No results found for this or any previous visit (from the past 240 hour(s)).  Studies/Results: Dg Chest 2 View  04/13/2012  *RADIOLOGY REPORT*  Clinical Data: Chest pain.  Headache.  CHEST - 2 VIEW  Comparison: 09/06/2004  Findings: The film is made with shallow lung inflation.  Heart size is probably normal.  There are no focal consolidations or pleural effusions.  Mild perihilar bronchitic changes are present.  IMPRESSION:  1.  Shallow inflation. 2. No evidence for acute cardiopulmonary abnormality.   Original Report Authenticated By: Nolon Nations, M.D.     Medications: Scheduled Meds:   . [COMPLETED] sodium chloride   Intravenous STAT  . [COMPLETED] aspirin  324 mg Oral Once  . [COMPLETED] aspirin  324 mg Oral NOW   Or  . [COMPLETED] aspirin  300 mg Rectal NOW  . aspirin EC  81 mg Oral Daily  . atorvastatin  20 mg Oral q1800  . [COMPLETED] diphenhydrAMINE  25 mg Intravenous Once  . enoxaparin (LOVENOX) injection  70 mg Subcutaneous Q12H  . insulin aspart  0-9 Units Subcutaneous TID WC  . [  COMPLETED] insulin aspart  5 Units Intravenous Once  . [COMPLETED] ketorolac  30 mg Intravenous Once  . [COMPLETED] metoCLOPramide (REGLAN) injection  10 mg Intravenous Once  . metoprolol tartrate  12.5 mg Oral BID  . nitroGLYCERIN  0.5 inch Topical Q6H  . [COMPLETED] sodium chloride  1,000 mL Intravenous Once  . sodium chloride  3 mL Intravenous Q12H  . [DISCONTINUED] nitroGLYCERIN  0.5 inch Topical Q8H      LOS: 1 day   RAI,RIPUDEEP M.D. [facility] 04/14/2012, 12:03 PM Pager: [phone]  If  7PM-7AM, please contact night-coverage [email or web address] Password [email or web address]

## 2012-04-14 NOTE — Progress Notes (Signed)
Pt arrived to floor with c/o of midsternal CP upon assessment.  Pt. States pain 3-4/10 but "better than it has been."  EKG obtained per protocol and patient given 1 SL nitro with relief.  BP 136/82 HR 78 O2 sat 96% on 2L.  Primary and Cardiology MD on call notified of CP.  No orders received.  Will continue to monitor patient.

## 2012-04-14 NOTE — Progress Notes (Signed)
Utilization review completed.  

## 2012-04-14 NOTE — Progress Notes (Signed)
Called by RN regarding Lovenox q12h dosing for suspected ACS in anticipation of diagnostic cath tomorrow. Received last dose around 11 AM today. Next dose would be 11 PM. Discussed pharmacokinetics with pharmD and potential for transitioning to heparin tonight for cath tomorrow. Completed transition to heparin at ~ 9-10 PM without bolus for ACS dosing. Cath can be scheduled earlier in the morning with heparin discontinuation as needed on call to cath lab.   Jacquelynn Cree, PA-C 04/14/2012 10:03 PM

## 2012-04-14 NOTE — Progress Notes (Signed)
Continuing chest pain from last night. Pt also c/o of bilateral shoulder pain that started last hs. Dr. Tana Coast updated. Cardiology consulted by Dr. Tana Coast. Stephen Lane O .

## 2012-04-14 NOTE — Progress Notes (Signed)
12 Lead EKG done per protocol. Stephen Lane .

## 2012-04-14 NOTE — Progress Notes (Signed)
Pt c/o midsternal cp 5/10.  Pt denies any radiating pains. no SOB, and no nausea/vomiting associated with CP per patient. EKG obtained per protocol.  2 SL nitro's given per protocol with pt stating pain free after 2nd nitro.  Pt. Also with 0.5 inch ntg paste to LUA per order.  BP 156/98 HR 67 O2 sat 98% on 2L oxygen.  Will continue to monitor patient.

## 2012-04-14 NOTE — Progress Notes (Signed)
Patient ID: Stephen Lane, male   DOB: 04/14/1961, 51 y.o.   MRN: [id number] Subjective:  No pain currently.   Objective:  Vital Signs in the last 24 hours: Temp:  [97.5 F (36.4 C)-98.4 F (36.9 C)] 97.5 F (36.4 C) (11/10 0500) Pulse Rate:  [64-81] 69  (11/10 0500) Resp:  [12-18] 12  (11/10 0500) BP: (133-152)/(86-90) 152/86 mmHg (11/10 0500) SpO2:  [98 %-100 %] 100 % (11/10 0500) Weight:  [153 lb (69.4 kg)] 153 lb (69.4 kg) (11/09 2248)  Intake/Output from previous day:   Intake/Output from this shift:    Physical Exam: Well appearing NAD HEENT: Unremarkable Neck:  No JVD, no thyromegally Lungs:  Clear with no wheezes HEART:  Regular rate rhythm, no murmurs, no rubs, no clicks Abd:  Flat, positive bowel sounds, no organomegally, no rebound, no guarding Ext:  2 plus pulses, no edema, no cyanosis, no clubbing Skin:  No rashes no nodules Neuro:  CN II through XII intact, motor grossly intact  Lab Results:  Basename 04/14/12 0544 04/13/12 1620  WBC 7.0 7.0  HGB 11.1* 12.7*  PLT 215 264    Basename 04/13/12 1620  NA 129*  K 3.9  CL 92*  CO2 29  GLUCOSE 487*  BUN 25*  CREATININE 1.28    Basename 04/14/12 0544 04/13/12 2240  TROPONINI <0.30 <0.30   Hepatic Function Panel  Basename 04/13/12 1620  PROT 6.7  ALBUMIN 2.9*  AST 25  ALT 17  ALKPHOS 145*  BILITOT 0.3  BILIDIR --  IBILI --    Basename 04/14/12 0544  CHOL 228*   No results found for this basename: PROTIME in the last 72 hours  Imaging: Dg Chest 2 View  04/13/2012  *RADIOLOGY REPORT*  Clinical Data: Chest pain.  Headache.  CHEST - 2 VIEW  Comparison: 09/06/2004  Findings: The film is made with shallow lung inflation.  Heart size is probably normal.  There are no focal consolidations or pleural effusions.  Mild perihilar bronchitic changes are present.  IMPRESSION:  1.  Shallow inflation. 2. No evidence for acute cardiopulmonary abnormality.   Original Report Authenticated By: Nolon Nations, M.D.     Cardiac Studies: Tele - nsr Assessment/Plan:  1. Unstable angina with abnormal ECG which has normalized on medical therapy. 2. DM 3. Dyslipidemia 4. 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 A negative stress test will not reduce his pre-test probability sufficiently to allow [place] to exclude CAD. Continue lovenox.  LOS: 1 day    Shakeira Rhee,M.D. 04/14/2012, 8:49 AM

## 2012-04-14 NOTE — Progress Notes (Signed)
ANTICOAGULATION CONSULT NOTE - Follow up [facility] for lovenox>>heparin for possible cath Indication: chest pain/ACS  No Known Allergies  Patient Measurements: Height: 5\' 2"  (157.5 cm) Weight: 153 lb (69.4 kg) IBW/kg (Calculated) : 54.6  Heparin Dosing Weight: 65kg   Vital Signs: Temp: 97.8 F (36.6 C) (11/10 1355) Temp src: Oral (11/10 1355) BP: 152/91 mmHg (11/10 1355) Pulse Rate: 80  (11/10 1355)  Labs:  Basename 04/14/12 1410 04/14/12 1012 04/14/12 0742 04/14/12 0544 04/13/12 2240 04/13/12 1620  HGB -- -- -- 11.1* -- 12.7*  HCT -- -- -- 31.1* -- 35.1*  PLT -- -- -- 215 -- 264  APTT -- -- -- -- -- --  LABPROT -- -- -- -- -- --  INR -- -- -- -- -- --  HEPARINUNFRC -- -- -- -- -- --  CREATININE -- -- 1.30 -- -- 1.28  CKTOTAL 102 -- 81 -- -- --  CKMB 3.5 -- 3.1 -- -- --  TROPONINI -- <0.30 -- <0.30 <0.30 --    Estimated Creatinine Clearance: 56.9 ml/min (by C-G formula based on Cr of 1.3).  Assessment: 51 yo male with DM and HTN with CP. Lovenox started for r/o acs, new orders from [facility] to transition to heparin with possible cath tomorrow. Last Lovenox dose was this morning at 11am.  Goal of Therapy:  Heparin level 0.3-0.7 units/ml Monitor platelets by anticoagulation protocol: Yes   Plan:  Heparin 1000 units/hr Check heparin level/cbc daily  Georgina Peer 04/14/2012,8:35 PM

## 2012-04-15 ENCOUNTER — Encounter (HOSPITAL_COMMUNITY)
Admission: EM | Disposition: A | Discharge status: Home / Self Care | Payer: Self-pay | Source: Home / Self Care | Attending: Internal Medicine

## 2012-04-15 DIAGNOSIS — K219 Gastro-esophageal reflux disease without esophagitis: Secondary | ICD-10-CM | POA: Diagnosis present

## 2012-04-15 DIAGNOSIS — I2510 Atherosclerotic heart disease of native coronary artery without angina pectoris: Secondary | ICD-10-CM

## 2012-04-15 DIAGNOSIS — R079 Chest pain, unspecified: Secondary | ICD-10-CM

## 2012-04-15 HISTORY — PX: CORONARY ANGIOPLASTY WITH STENT PLACEMENT: SHX49

## 2012-04-15 HISTORY — PX: PERCUTANEOUS CORONARY STENT INTERVENTION (PCI-S): SHX5485

## 2012-04-15 HISTORY — PX: LEFT HEART CATHETERIZATION WITH CORONARY ANGIOGRAM: SHX5451

## 2012-04-15 LAB — CBC
HCT: 35 % — ABNORMAL LOW (ref 39.0–52.0)
MCH: 31.2 pg (ref 26.0–34.0)
MCHC: 36.2 g/dL — ABNORMAL HIGH (ref 30.0–36.0)
MCV: 85.2 fL (ref 78.0–100.0)
Platelets: 219 10*3/uL (ref 150–400)
Platelets: 256 10*3/uL (ref 150–400)
RBC: 4.11 MIL/uL — ABNORMAL LOW (ref 4.22–5.81)
RDW: 13.1 % (ref 11.5–15.5)
WBC: 8 10*3/uL (ref 4.0–10.5)

## 2012-04-15 LAB — BASIC METABOLIC PANEL
BUN: 22 mg/dL (ref 6–23)
CO2: 25 mEq/L (ref 19–32)
Calcium: 8.1 mg/dL — ABNORMAL LOW (ref 8.4–10.5)
Calcium: 8.3 mg/dL — ABNORMAL LOW (ref 8.4–10.5)
Chloride: 100 mEq/L (ref 96–112)
Creatinine, Ser: 1.26 mg/dL (ref 0.50–1.35)
GFR calc Af Amer: 74 mL/min — ABNORMAL LOW (ref >90)
GFR calc non Af Amer: 64 mL/min — ABNORMAL LOW (ref >90)
Glucose, Bld: 261 mg/dL — ABNORMAL HIGH (ref 70–99)
Potassium: 4 mEq/L (ref 3.5–5.1)
Sodium: 134 mEq/L — ABNORMAL LOW (ref 135–145)

## 2012-04-15 LAB — GLUCOSE, CAPILLARY
Glucose-Capillary: 211 mg/dL — ABNORMAL HIGH (ref 70–99)
Glucose-Capillary: 176 mg/dL — ABNORMAL HIGH (ref 70–99)
Glucose-Capillary: 263 mg/dL — ABNORMAL HIGH (ref 70–99)

## 2012-04-15 LAB — LIPID PANEL
HDL: 40 mg/dL (ref >39)
LDL Cholesterol: UNDETERMINED mg/dL (ref 0–99)
Total CHOL/HDL Ratio: 6.2 RATIO

## 2012-04-15 LAB — HEMOGLOBIN A1C: Mean Plasma Glucose: 346 mg/dL — ABNORMAL HIGH (ref <117)

## 2012-04-15 SURGERY — LEFT HEART CATHETERIZATION WITH CORONARY ANGIOGRAM
Anesthesia: LOCAL

## 2012-04-15 MED ORDER — FENTANYL CITRATE 0.05 MG/ML IJ SOLN
INTRAMUSCULAR | Status: AC
  Filled 2012-04-15: qty 2

## 2012-04-15 MED ORDER — SODIUM CHLORIDE 0.9 % IV SOLN
0.2500 mg/kg/h | INTRAVENOUS | Status: DC
  Filled 2012-04-15: qty 250

## 2012-04-15 MED ORDER — MIDAZOLAM HCL 2 MG/2ML IJ SOLN
INTRAMUSCULAR | Status: AC
  Filled 2012-04-15: qty 2

## 2012-04-15 MED ORDER — HEPARIN SODIUM (PORCINE) 1000 UNIT/ML IJ SOLN
INTRAMUSCULAR | Status: AC
  Filled 2012-04-15: qty 1

## 2012-04-15 MED ORDER — VERAPAMIL HCL 2.5 MG/ML IV SOLN
INTRAVENOUS | Status: AC
  Filled 2012-04-15: qty 2

## 2012-04-15 MED ORDER — PRASUGREL HCL 10 MG PO TABS
10.0000 mg | ORAL_TABLET | Freq: Every day | ORAL | Status: DC
  Administered 2012-04-16 – 2012-04-17 (×2): 10 mg via ORAL
  Filled 2012-04-15 (×3): qty 1

## 2012-04-15 MED ORDER — SODIUM CHLORIDE 0.9 % IV SOLN
INTRAVENOUS | Status: DC
  Administered 2012-04-15: 08:00:00 via INTRAVENOUS

## 2012-04-15 MED ORDER — HEPARIN (PORCINE) IN NACL 2-0.9 UNIT/ML-% IJ SOLN
INTRAMUSCULAR | Status: AC
  Filled 2012-04-15: qty 1000

## 2012-04-15 MED ORDER — PRASUGREL HCL 10 MG PO TABS
ORAL_TABLET | ORAL | Status: AC
  Filled 2012-04-15: qty 6

## 2012-04-15 MED ORDER — SODIUM CHLORIDE 0.9 % IJ SOLN: 3.0000 mL | Freq: Two times a day (BID) | INTRAMUSCULAR | Status: DC

## 2012-04-15 MED ORDER — LIDOCAINE HCL (PF) 1 % IJ SOLN
INTRAMUSCULAR | Status: AC
  Filled 2012-04-15: qty 30

## 2012-04-15 MED ORDER — OXYCODONE-ACETAMINOPHEN 5-325 MG PO TABS: 1.0000 | ORAL_TABLET | ORAL | Status: DC | PRN

## 2012-04-15 MED ORDER — SODIUM CHLORIDE 0.9 % IJ SOLN: 3.0000 mL | INTRAMUSCULAR | Status: DC | PRN

## 2012-04-15 MED ORDER — DIAZEPAM 5 MG PO TABS: 5.0000 mg | ORAL_TABLET | ORAL | Status: DC

## 2012-04-15 MED ORDER — LIVING WELL WITH DIABETES BOOK
Freq: Once | Status: DC
  Filled 2012-04-15: qty 1

## 2012-04-15 MED ORDER — ONDANSETRON HCL 4 MG/2ML IJ SOLN: 4.0000 mg | Freq: Four times a day (QID) | INTRAMUSCULAR | Status: DC | PRN

## 2012-04-15 MED ORDER — BD GETTING STARTED TAKE HOME KIT: 1/2ML X 30G SYRINGES
1.0000 | Freq: Once | Status: DC
  Filled 2012-04-15: qty 1

## 2012-04-15 MED ORDER — SODIUM CHLORIDE 0.9 % IV SOLN: 250.0000 mL | INTRAVENOUS | Status: DC | PRN

## 2012-04-15 MED ORDER — SODIUM CHLORIDE 0.9 % IV SOLN: 1.0000 mL/kg/h | INTRAVENOUS | Status: AC

## 2012-04-15 MED ORDER — SODIUM CHLORIDE 0.9 % IV SOLN: 250.0000 mL | INTRAVENOUS | Status: DC

## 2012-04-15 MED ORDER — ASPIRIN 81 MG PO CHEW
324.0000 mg | CHEWABLE_TABLET | ORAL | Status: AC
  Administered 2012-04-15: 324 mg via ORAL
  Filled 2012-04-15: qty 4

## 2012-04-15 MED ORDER — INSULIN GLARGINE 100 UNIT/ML ~~LOC~~ SOLN
10.0000 [IU] | Freq: Every day | SUBCUTANEOUS | Status: DC
  Administered 2012-04-15: 22:00:00 10 [IU] via SUBCUTANEOUS

## 2012-04-15 MED ORDER — PRASUGREL HCL 10 MG PO TABS: 60.0000 mg | ORAL_TABLET | Freq: Once | ORAL | Status: DC

## 2012-04-15 MED ORDER — BIVALIRUDIN 250 MG IV SOLR
INTRAVENOUS | Status: AC
  Filled 2012-04-15: qty 250

## 2012-04-15 MED ORDER — NITROGLYCERIN 0.2 MG/ML ON CALL CATH LAB
INTRAVENOUS | Status: AC
  Filled 2012-04-15: qty 1

## 2012-04-15 MED ORDER — ACETAMINOPHEN 325 MG PO TABS
650.0000 mg | ORAL_TABLET | ORAL | Status: DC | PRN
  Administered 2012-04-17: 650 mg via ORAL
  Filled 2012-04-15: qty 2

## 2012-04-15 MED ORDER — INSULIN ASPART 100 UNIT/ML ~~LOC~~ SOLN
3.0000 [IU] | Freq: Once | SUBCUTANEOUS | Status: AC
  Administered 2012-04-15: 3 [IU] via SUBCUTANEOUS

## 2012-04-15 NOTE — Procedures (Signed)
   Cardiac Catheterization Procedure Note  Name: JARRED PURTEE MRN: [id number] DOB: [birth year]  Procedure: Left Heart Cath, Selective Coronary Angiography, LV angiography  Indication: Unstable angina   Procedural Details: The right wrist was prepped, draped, and anesthetized with 1% lidocaine. Using the modified Seldinger technique, a 5 French sheath was introduced into the right radial artery. 3 mg of verapamil was administered through the sheath, weight-based unfractionated heparin was administered intravenously. Standard Judkins catheters (JR4, JL3.0 guide) were used for selective coronary angiography and left ventriculography. Catheter exchanges were performed over an exchange length guidewire. There were no immediate procedural complications.  The patient went on to PCI which will be reported separately.   Procedural Findings: Hemodynamics: AO 119/81 LV 110/21  Coronary angiography: Coronary dominance: right  Left mainstem: Luminal irregularities.  Left anterior descending (LAD): 50% proximal LAD at D1.  75% ostial stenosis of a small to moderate D1. Remainder of the LAD had luminal irregularities.   Left circumflex (LCx): Large system with 40% proximal stenosis followed by 95% mid LCx stenosis just proximal to several large OM vessels.    Right coronary artery (RCA): 60% proximal RCA stenosis, 90% mid RCA stenosis.   Left ventriculography: Left ventricular systolic function is normal, LVEF is estimated at 55-65%, there is no significant mitral regurgitation   Final Conclusions:  Culprit vessel for unstable angina was likely a 95% mid LCx stenosis.  The LCx system was large with several substantial obtuse marginals put at risk by this lesion.  There was also a tight mid RCA stenosis that likely would be ischemia-provoking.  Would plan intervention to LCx and RCA today.  Medical management of LAD system disease.  Will load with Effient.  Discussed with Dr. Burt Knack.   Loralie Champagne 04/15/2012, 3:05 PM

## 2012-04-15 NOTE — Interval H&P Note (Signed)
History and Physical Interval Note:  04/15/2012 2:08 PM  Stephen Lane  has presented today for surgery, with the diagnosis of cp  The various methods of treatment have been discussed with the patient and family. After consideration of risks, benefits and other options for treatment, the patient has consented to  Procedure(s) (LRB) with comments: LEFT HEART CATHETERIZATION WITH CORONARY ANGIOGRAM (N/A) as a surgical intervention .  The patient's history has been reviewed, patient examined, no change in status, stable for surgery.  I have reviewed the patient's chart and labs.  Questions were answered to the patient's satisfaction.     Atha Muradyan [employer]

## 2012-04-15 NOTE — Progress Notes (Signed)
Admitted with CP.  Has significant history of diabetes.  Not taking any medications for his diabetes and does not have PCP due to cost constraints.  Went to speak with patient today ~3pm, however he was down in the cath lab.  Will revisit patient tomorrow.  Noted Lantus 10 units QHS started today (will get 1st dose tonight).  Have ordered insulin instruction to be started by RN asap.  Patient will need affordable medications at d/c.  Can get Reli-on brand Novolin 70/30 insulin at [facility] for $24.88 per vial with a physician's Rx.  Recommend we convert patient to 70/30 insulin after all his procedures are completed.  Based on his weight of 69 kg, could start with basal dosing around 0.2 units/kg.  This would be ~14 units basal insulin to start.  With this in mind, would start patient on 70/30 insulin 10 units bid with meals and titrate as needed (conservative starting point).  Will follow and visit patient in person tomorrow. Wyn Quaker RN, MSN, CDE Diabetes Coordinator [facility] [phone]

## 2012-04-15 NOTE — H&P (View-Only) (Signed)
PROGRESS NOTE  Subjective:   Stephen Lane is a 51 yo who presented with unstable angina, Abn. ECG .  Cardiac enzymes are negative.  No pain at present.   Objective:    Vital Signs:   Temp:  [97.8 F (36.6 C)-98.3 F (36.8 C)] 98.3 F (36.8 C) (11/11 0500) Pulse Rate:  [72-81] 74  (11/11 0500) Resp:  [16-18] 16  (11/11 0500) BP: (131-159)/(88-99) 154/99 mmHg (11/11 0500) SpO2:  [98 %-100 %] 98 % (11/11 0500)  Last BM Date: 04/13/12   24-hour weight change: Weight change:   Weight trends: Filed Weights   04/13/12 2248  Weight: 153 lb (69.4 kg)    Intake/Output:  11/10 0701 - 11/11 0700 In: 780 [P.O.:780] Out: -      Physical Exam: BP 154/99  Pulse 74  Temp 98.3 F (36.8 C) (Oral)  Resp 16  Ht $R'5\' 2"'WJ$  (1.575 m)  Wt 153 lb (69.4 kg)  BMI 27.98 kg/m2  SpO2 98%  General: Vital signs reviewed and noted. Well-developed, well-nourished, in no acute distress; alert, appropriate and cooperative .  Head: Normocephalic, atraumatic.  Eyes: conjunctivae/corneas clear.  EOM's intact.   Throat: normal  Neck: Supple. Normal carotids. No JVD  Lungs:  Clear to auscultation  Heart: Regular rate,  With normal  S1 S2. No murmurs, gallops or rubs  Abdomen:  Soft, non-tender, non-distended with normoactive bowel sounds. No hepatomegaly. No rebound/guarding. No abdominal masses.  Extremities: Distal pedal pulses are 2+ .  No edema.    Neurologic: A&O X3, CN II - XII are grossly intact. Motor strength is 5/5 in the all 4 extremities.  Psych: Responds to questions appropriately with normal affect.    Labs: BMET:  Basename 04/15/12 0505 04/14/12 2329  NA 134* 133*  K 3.8 4.0  CL 100 100  CO2 25 26  GLUCOSE 234* 261*  BUN 21 22  CREATININE 1.20 1.26  CALCIUM 8.3* 8.1*  MG -- --  PHOS -- --    Liver function tests:  Basename 04/13/12 1620  AST 25  ALT 17  ALKPHOS 145*  BILITOT 0.3  PROT 6.7  ALBUMIN 2.9*   No results found for this basename: LIPASE:2,AMYLASE:2 in  the last 72 hours  CBC:  Basename 04/15/12 0505 04/14/12 2329 04/13/12 1620  WBC 8.0 7.0 --  NEUTROABS -- -- 3.7  HGB 12.7* 11.9* --  HCT 35.0* 32.9* --  MCV 85.2 86.1 --  PLT 256 219 --    Cardiac Enzymes:  Basename 04/14/12 2028 04/14/12 1410 04/14/12 1012 04/14/12 0742 04/14/12 0544 04/13/12 2240  CKTOTAL 124 102 -- 81 -- --  CKMB 4.4* 3.5 -- 3.1 -- --  TROPONINI -- -- <0.30 -- <0.30 <0.30    Coagulation Studies: No results found for this basename: LABPROT:5,INR:5 in the last 72 hours  Other: No components found with this basename: POCBNP:3 No results found for this basename: DDIMER in the last 72 hours  Basename 04/13/12 2104  HGBA1C 13.7*    Basename 04/14/12 0544  CHOL 228*  HDL 31*  LDLCALC UNABLE TO CALCULATE IF TRIGLYCERIDE OVER 400 mg/dL  TRIG 634*  CHOLHDL 7.4   No results found for this basename: TSH,T4TOTAL,FREET3,T3FREE,THYROIDAB in the last 72 hours  Basename 04/14/12 1122  VITAMINB12 777  FOLATE --  FERRITIN 222  TIBC 221  IRON 73  RETICCTPCT 1.9     Tele:  NSR at 72  Medications:    Infusions:    . heparin 1,000 Units/hr (04/14/12 2156)  Scheduled Medications:    . [COMPLETED] sodium chloride   Intravenous STAT  . aspirin EC  81 mg Oral Daily  . atorvastatin  20 mg Oral q1800  . influenza  inactive virus vaccine  0.5 mL Intramuscular Tomorrow-1000  . insulin aspart  0-9 Units Subcutaneous TID WC  . metoprolol tartrate  12.5 mg Oral BID  . nitroGLYCERIN  0.5 inch Topical Q6H  . pantoprazole  40 mg Oral Q0600  . pneumococcal 23 valent vaccine  0.5 mL Intramuscular Tomorrow-1000  . sodium chloride  3 mL Intravenous Q12H  . [DISCONTINUED] enoxaparin (LOVENOX) injection  70 mg Subcutaneous Q12H  . [DISCONTINUED] nitroGLYCERIN  0.5 inch Topical Q8H    Assessment/ Plan:    Chest pain (04/13/2012) Unstable angina.  He had TWI inferiorly which resolved with resolution of his CP. I agree with plans for cath.  Discussed risks,  benefits, options.  He understands and agrees to proceed with cath.  Diabetes mellitus without complication ()  EKG abnormalities (04/13/2012) Resolved  -   Normocytic anemia (04/13/2012)    Disposition: cath today. Length of Stay: 2  Ramond Dial., MD, [facility] 04/15/2012, 7:39 AM Office [phone] Pager [phone]

## 2012-04-15 NOTE — Progress Notes (Signed)
Patient ID: Stephen Lane  male  [id number]:[id number]    DOB: Oct 03, 1960    DOA: 04/13/2012  PCP: William Hamburger, MD  Assessment/Plan: Principal Problem:  *Chest pain/ unstable angina with abnormal EKG: CP resolved - Planned for cardiac cath today - Continue aspirin, Lipitor, beta blocker, nitroglycerin, statins, heparin drip - will check lipid panel  Active Problems:  Diabetes mellitus without complication: Uncontrolled, hemoglobin A1c 13.7 - Will likely benefit from insulin out-pt, continue sliding scale insulin, add Lantus - Patient has insurance issues and unable to medications, will likely benefit from cheaper versions, will place consult to DM coordinator, d/w case management     Normocytic anemia - anemia panel  DVT Prophylaxis: on therapeutic lovenox   Code Status: FC  Disposition: await cath, possible tomorrow    Subjective: No chest pain, shortness of breath, nausea, vomiting, diaphoresis, palpitations.  Objective: Weight change:   Intake/Output Summary (Last 24 hours) at 04/15/12 1223 Last data filed at 04/14/12 1800  Gross per 24 hour  Intake    780 ml  Output      0 ml  Net    780 ml   Blood pressure 124/84, pulse 77, temperature 98.3 F (36.8 C), temperature source Oral, resp. rate 16, height $RemoveBe'5\' 2"'cqChiKTby$  (1.575 m), weight 69.4 kg (153 lb), SpO2 98.00%.  Physical Exam: General: Alert and awake, oriented x3, not in any acute distress. HEENT: anicteric sclera, pupils reactive to light and accommodation, EOMI CVS: S1-S2 clear, no murmur rubs or gallops Chest: clear to auscultation bilaterally, no wheezing, rales or rhonchi Abdomen: soft nontender, nondistended, normal bowel sounds, no organomegaly Extremities: no cyanosis, clubbing or edema noted bilaterally Neuro: Cranial nerves II-XII intact, no focal neurological deficits  Lab Results: Basic Metabolic Panel:  Lab 96/78/93 0505 04/14/12 2329  NA 134* 133*  K 3.8 4.0  CL 100 100  CO2 25 26  GLUCOSE 234*  261*  BUN 21 22  CREATININE 1.20 1.26  CALCIUM 8.3* 8.1*  MG -- --  PHOS -- --   Liver Function Tests:  Lab 04/13/12 1620  AST 25  ALT 17  ALKPHOS 145*  BILITOT 0.3  PROT 6.7  ALBUMIN 2.9*   CBC:  Lab 04/15/12 0505 04/14/12 2329 04/13/12 1620  WBC 8.0 7.0 --  NEUTROABS -- -- 3.7  HGB 12.7* 11.9* --  HCT 35.0* 32.9* --  MCV 85.2 86.1 --  PLT 256 219 --   Cardiac Enzymes:  Lab 04/14/12 2028 04/14/12 1410 04/14/12 1012 04/14/12 0742 04/14/12 0544 04/13/12 2240  CKTOTAL 124 102 -- 81 -- --  CKMB 4.4* 3.5 -- 3.1 -- --  CKMBINDEX -- -- -- -- -- --  TROPONINI -- -- <0.30 -- <0.30 <0.30   BNP: No components found with this basename: POCBNP:2 CBG:  Lab 04/15/12 1144 04/15/12 0755 04/14/12 2140 04/14/12 1650 04/14/12 1126  GLUCAP 176* 211* 270* 243* 289*     Micro Results: No results found for this or any previous visit (from the past 240 hour(s)).  Studies/Results: Dg Chest 2 View  04/13/2012  *RADIOLOGY REPORT*  Clinical Data: Chest pain.  Headache.  CHEST - 2 VIEW  Comparison: 09/06/2004  Findings: The film is made with shallow lung inflation.  Heart size is probably normal.  There are no focal consolidations or pleural effusions.  Mild perihilar bronchitic changes are present.  IMPRESSION:  1.  Shallow inflation. 2. No evidence for acute cardiopulmonary abnormality.   Original Report Authenticated By: Nolon Nations, M.D.     Medications:  Scheduled Meds:    . [COMPLETED] aspirin  324 mg Oral Pre-Cath  . aspirin EC  81 mg Oral Daily  . atorvastatin  20 mg Oral q1800  . diazepam  5 mg Oral On Call  . [COMPLETED] influenza  inactive virus vaccine  0.5 mL Intramuscular Tomorrow-1000  . insulin aspart  0-9 Units Subcutaneous TID WC  . metoprolol tartrate  12.5 mg Oral BID  . nitroGLYCERIN  0.5 inch Topical Q6H  . pantoprazole  40 mg Oral Q0600  . [COMPLETED] pneumococcal 23 valent vaccine  0.5 mL Intramuscular Tomorrow-1000  . sodium chloride  3 mL Intravenous  Q12H  . sodium chloride  3 mL Intravenous Q12H  . [DISCONTINUED] enoxaparin (LOVENOX) injection  70 mg Subcutaneous Q12H      LOS: 2 days   Tarrence Enck M.D. [facility] 04/15/2012, 12:23 PM Pager: [phone]  If 7PM-7AM, please contact night-coverage [email or web address] Password [email or web address]

## 2012-04-15 NOTE — CV Procedure (Signed)
   CARDIAC CATH NOTE  Name: Stephen Lane MRN: [id number] DOB: 1960/11/03  Procedure: PTCA and stenting of the LCx and RCA  Indication: Unstable angina. This 51 year old gentleman with uncontrolled diabetes and hypertension presented with unstable angina. He underwent diagnostic cardiac catheterization showing severe 2 vessel CAD. He was also noted to have modest disease in the LAD and diagonal branches. Because of the relatively focal nature of his disease, we elected to proceed with PCI. He was loaded with effient 60 mg on the table.   Procedural Details:  There is an indwelling 5 French sheath in the right radial artery. This was exchanged out for a 6 [place] sheath. 3 mg verapamil was administered through the radial sheath. Weight-based bivalirudin was given for anticoagulation. Once a therapeutic ACT was achieved, a 6 [place] AL-1 guide catheter was inserted.  The left main was very difficult to cannulate and I had attempted several guide catheters before using the AL-1. A BMW coronary guidewire was used to cross the lesion.  The lesion was predilated with a 2.5 x 15 mm balloon.  The lesion was then stented with a 3.5 x 20 mm drug-eluting stent.  The stent was postdilated with a 3.75 mm noncompliant balloon.  Following PCI, there was 0% residual stenosis and TIMI-3 flow.   Attention was then turned to the right coronary artery. A JR 4 guide catheter was utilized. April water wire was used to cross the lesion. The lesion was predilated with a 2.5 x 12 mm balloon. The lesion was then stented with a 2.75 x 16 mm drug-eluting stent. The stent was postdilated with a 3.0 mm noncompliant balloon. There was 0% residual stenosis and TIMI-3 flow at the completion of the procedure. Final angiography confirmed an excellent result. The patient tolerated the procedure well. There were no immediate procedural complications. A TR band was used for radial hemostasis. The patient was transferred to the post  catheterization recovery area for further monitoring.  Lesion Data: Lesion 1: Vessel: Left circumflex/mid Percent stenosis (pre): 90 TIMI-flow (pre):  3 Stent:  3.5 x 20 mm drug-eluting Percent stenosis (post): 0 TIMI-flow (post): 3  Lesion 2: Vessel: RCA/mid Percent stenosis (pre): 90 TIMI-flow (pre):  3 Stent:  2.75 x 16 mm drug-eluting Percent stenosis (post): 0 TIMI-flow (post): 3   Conclusions: Successful 2 vessel PCI as outlined above, drug-eluting stents were utilized.  Recommendations: Dual antiplatelet therapy minimum of 12 months. Aggressive medical therapy.  Sherren Mocha 04/15/2012, 5:03 PM

## 2012-04-15 NOTE — Progress Notes (Signed)
ANTICOAGULATION CONSULT NOTE - Follow Up Consult  Pharmacy Consult for heparin Indication: chest pain/ACS  Labs:  Basename 04/15/12 0505 04/14/12 2329 04/14/12 2028 04/14/12 1410 04/14/12 1012 04/14/12 0742 04/14/12 0544 04/13/12 2240 04/13/12 1620  HGB 12.7* 11.9* -- -- -- -- -- -- --  HCT 35.0* 32.9* -- -- -- -- 31.1* -- --  PLT 256 219 -- -- -- -- 215 -- --  APTT -- -- -- -- -- -- -- -- --  LABPROT -- -- -- -- -- -- -- -- --  INR -- -- -- -- -- -- -- -- --  HEPARINUNFRC 0.81* -- -- -- -- -- -- -- --  CREATININE -- 1.26 -- -- -- 1.30 -- -- 1.28  CKTOTAL -- -- 124 102 -- 81 -- -- --  CKMB -- -- 4.4* 3.5 -- 3.1 -- -- --  TROPONINI -- -- -- -- <0.30 -- <0.30 <0.30 --    Assessment: 51yo male slightly supratherapeutic on heparin with initial dosing for CP though had Lovenox dose <24hr ago, which may still be having some effect.  Goal of Therapy:  Heparin level 0.3-0.7 units/ml   Plan:  Will decrease heparin gtt slightly to 900 units/hr since going to cath today and f/u afterwards.  Rogue Bussing PharmD BCPS 04/15/2012,7:12 AM

## 2012-04-15 NOTE — Progress Notes (Signed)
PROGRESS NOTE  Subjective:   [name] is a 51 yo who presented with unstable angina, Abn. ECG .  Cardiac enzymes are negative.  No pain at present.   Objective:    Vital Signs:   Temp:  [97.8 F (36.6 C)-98.3 F (36.8 C)] 98.3 F (36.8 C) (11/11 0500) Pulse Rate:  [72-81] 74  (11/11 0500) Resp:  [16-18] 16  (11/11 0500) BP: (131-159)/(88-99) 154/99 mmHg (11/11 0500) SpO2:  [98 %-100 %] 98 % (11/11 0500)  Last BM Date: 04/13/12   24-hour weight change: Weight change:   Weight trends: Filed Weights   04/13/12 2248  Weight: 153 lb (69.4 kg)    Intake/Output:  11/10 0701 - 11/11 0700 In: 780 [P.O.:780] Out: -      Physical Exam: BP 154/99  Pulse 74  Temp 98.3 F (36.8 C) (Oral)  Resp 16  Ht $R'5\' 2"'VO$  (1.575 m)  Wt 153 lb (69.4 kg)  BMI 27.98 kg/m2  SpO2 98%  General: Vital signs reviewed and noted. Well-developed, well-nourished, in no acute distress; alert, appropriate and cooperative .  Head: Normocephalic, atraumatic.  Eyes: conjunctivae/corneas clear.  EOM's intact.   Throat: normal  Neck: Supple. Normal carotids. No JVD  Lungs:  Clear to auscultation  Heart: Regular rate,  With normal  S1 S2. No murmurs, gallops or rubs  Abdomen:  Soft, non-tender, non-distended with normoactive bowel sounds. No hepatomegaly. No rebound/guarding. No abdominal masses.  Extremities: Distal pedal pulses are 2+ .  No edema.    Neurologic: A&O X3, CN II - XII are grossly intact. Motor strength is 5/5 in the all 4 extremities.  Psych: Responds to questions appropriately with normal affect.    Labs: BMET:  Basename 04/15/12 0505 04/14/12 2329  NA 134* 133*  K 3.8 4.0  CL 100 100  CO2 25 26  GLUCOSE 234* 261*  BUN 21 22  CREATININE 1.20 1.26  CALCIUM 8.3* 8.1*  MG -- --  PHOS -- --    Liver function tests:  Basename 04/13/12 1620  AST 25  ALT 17  ALKPHOS 145*  BILITOT 0.3  PROT 6.7  ALBUMIN 2.9*   No results found for this basename: LIPASE:2,AMYLASE:2 in  the last 72 hours  CBC:  Basename 04/15/12 0505 04/14/12 2329 04/13/12 1620  WBC 8.0 7.0 --  NEUTROABS -- -- 3.7  HGB 12.7* 11.9* --  HCT 35.0* 32.9* --  MCV 85.2 86.1 --  PLT 256 219 --    Cardiac Enzymes:  Basename 04/14/12 2028 04/14/12 1410 04/14/12 1012 04/14/12 0742 04/14/12 0544 04/13/12 2240  CKTOTAL 124 102 -- 81 -- --  CKMB 4.4* 3.5 -- 3.1 -- --  TROPONINI -- -- <0.30 -- <0.30 <0.30    Coagulation Studies: No results found for this basename: LABPROT:5,INR:5 in the last 72 hours  Other: No components found with this basename: POCBNP:3 No results found for this basename: DDIMER in the last 72 hours  Basename 04/13/12 2104  HGBA1C 13.7*    Basename 04/14/12 0544  CHOL 228*  HDL 31*  LDLCALC UNABLE TO CALCULATE IF TRIGLYCERIDE OVER 400 mg/dL  TRIG 634*  CHOLHDL 7.4   No results found for this basename: TSH,T4TOTAL,FREET3,T3FREE,THYROIDAB in the last 72 hours  Basename 04/14/12 1122  VITAMINB12 777  FOLATE --  FERRITIN 222  TIBC 221  IRON 73  RETICCTPCT 1.9     Tele:  NSR at 72  Medications:    Infusions:    . heparin 1,000 Units/hr (04/14/12 2156)  Scheduled Medications:    . [COMPLETED] sodium chloride   Intravenous STAT  . aspirin EC  81 mg Oral Daily  . atorvastatin  20 mg Oral q1800  . influenza  inactive virus vaccine  0.5 mL Intramuscular Tomorrow-1000  . insulin aspart  0-9 Units Subcutaneous TID WC  . metoprolol tartrate  12.5 mg Oral BID  . nitroGLYCERIN  0.5 inch Topical Q6H  . pantoprazole  40 mg Oral Q0600  . pneumococcal 23 valent vaccine  0.5 mL Intramuscular Tomorrow-1000  . sodium chloride  3 mL Intravenous Q12H  . [DISCONTINUED] enoxaparin (LOVENOX) injection  70 mg Subcutaneous Q12H  . [DISCONTINUED] nitroGLYCERIN  0.5 inch Topical Q8H    Assessment/ Plan:    Chest pain (04/13/2012) Unstable angina.  He had TWI inferiorly which resolved with resolution of his CP. I agree with plans for cath.  Discussed risks,  benefits, options.  He understands and agrees to proceed with cath.  Diabetes mellitus without complication ()  EKG abnormalities (04/13/2012) Resolved  -   Normocytic anemia (04/13/2012)    Disposition: cath today. Length of Stay: 2  Ramond Dial., MD, [facility] 04/15/2012, 7:39 AM Office [phone] Pager [phone]

## 2012-04-16 DIAGNOSIS — I200 Unstable angina: Secondary | ICD-10-CM

## 2012-04-16 DIAGNOSIS — Z9861 Coronary angioplasty status: Secondary | ICD-10-CM

## 2012-04-16 DIAGNOSIS — K219 Gastro-esophageal reflux disease without esophagitis: Secondary | ICD-10-CM

## 2012-04-16 LAB — CBC
Hemoglobin: 11.2 g/dL — ABNORMAL LOW (ref 13.0–17.0)
MCH: 31 pg (ref 26.0–34.0)
MCHC: 35.9 g/dL (ref 30.0–36.0)

## 2012-04-16 LAB — BASIC METABOLIC PANEL
Calcium: 7.9 mg/dL — ABNORMAL LOW (ref 8.4–10.5)
GFR calc Af Amer: 80 mL/min — ABNORMAL LOW (ref >90)
GFR calc non Af Amer: 69 mL/min — ABNORMAL LOW (ref >90)
Glucose, Bld: 174 mg/dL — ABNORMAL HIGH (ref 70–99)
Potassium: 3.6 mEq/L (ref 3.5–5.1)
Sodium: 135 mEq/L (ref 135–145)

## 2012-04-16 LAB — GLUCOSE, CAPILLARY
Glucose-Capillary: 137 mg/dL — ABNORMAL HIGH (ref 70–99)
Glucose-Capillary: 129 mg/dL — ABNORMAL HIGH (ref 70–99)
Glucose-Capillary: 181 mg/dL — ABNORMAL HIGH (ref 70–99)
Glucose-Capillary: 131 mg/dL — ABNORMAL HIGH (ref 70–99)

## 2012-04-16 MED ORDER — LISINOPRIL 10 MG PO TABS
10.0000 mg | ORAL_TABLET | Freq: Every day | ORAL | Status: DC
  Administered 2012-04-16 – 2012-04-17 (×2): 10 mg via ORAL
  Filled 2012-04-16 (×2): qty 1

## 2012-04-16 MED ORDER — LIVING WELL WITH DIABETES BOOK - IN SPANISH
Freq: Once | Status: AC
  Administered 2012-04-16: 10:00:00
  Filled 2012-04-16: qty 1

## 2012-04-16 MED ORDER — INSULIN ASPART PROT & ASPART (70-30 MIX) 100 UNIT/ML ~~LOC~~ SUSP
10.0000 [IU] | Freq: Two times a day (BID) | SUBCUTANEOUS | Status: DC
  Administered 2012-04-16 – 2012-04-17 (×3): 10 [IU] via SUBCUTANEOUS
  Filled 2012-04-16: qty 3

## 2012-04-16 NOTE — Progress Notes (Signed)
Interpreter Lesle Chris for [employer]

## 2012-04-16 NOTE — Progress Notes (Signed)
Interpreter Lesle Chris for RN Mickel Baas

## 2012-04-16 NOTE — Progress Notes (Addendum)
Results for Stephen Lane, Stephen Lane (MRN [id number]) as of 04/16/2012 12:16  Ref. Range 04/15/2012 07:55 04/15/2012 11:44 04/15/2012 21:09  Glucose-Capillary Latest Range: 70-99 mg/dL 211 (H) 176 (H) 263 (H)   Patient received 10 units Lantus last night.  AM CBG today 137 mg/dl.  Noted patient converted to 70/30 insulin bid today for cost reasons.  Patient does not have health insurance and will be paying for all meds out of pocket.  Patient can get Novolin Reli-on brand 70/30 insulin for $24.88 per vial at [facility] with a physician's Rx.  Will continue to follow CBGs and make further recommendations for insulin adjustments.  Spoke to patient and wife using telephonic interpreter line.  Discussed the importance of insulin with patient and how to take it.  Reminded patient that he will need to eat three meals a day and that he will need to check his CBGs at least bid at home (before breakfast and before supper) and to record all results.  Discussed basic diet information with patient and encouraged him to measure and monitor his carbohydrate intake.  Encouraged patient to read the [place] Living Well with Diabetes book and to review foods that contain carbohydrates.  Told patient about the [employer] insulin he can get for $24.88 per vial and also about the CBG meter he can get OTC at [facility].  Gave patient information on purchasing an inexpensive meter: CBG Meter is $16 and a box of 50 strips is $9.  Also discussed signs and symptoms of high and low blood sugars and what to do.  Stressed importance of taking quick acting glucose source if patient has low CBG.  Also discussed importance of rotating insulin injection sites and rotating fingerstick glucose sites.  MD- Please make sure to give patient a Rx for [employer] brand Reli-on Novolin 70/30 insulin and a Rx for insulin syringes at d/c.  Patient to be discharged home tomorrow. Will follow. Wyn Quaker RN, MSN, CDE Diabetes Coordinator Inpatient  Diabetes Program [phone]

## 2012-04-16 NOTE — Care Management Note (Signed)
    Page 1 of 1   04/16/2012     1:43:05 PM   CARE MANAGEMENT NOTE 04/16/2012  Patient:  Stephen Lane, Stephen Lane   Account Number:  [id number]  Date Initiated:  04/16/2012  Documentation initiated by:  Llana Aliment  Subjective/Objective Assessment:   51yo male admitted with Chest Pain.  Pt. lives at home with spouse.     Action/Plan:   Discharge planning   Anticipated DC Date:  04/17/2012   Anticipated DC Plan:  [place]  CM consult  Medication Assistance      Choice offered to / List presented to:             Status of service:  In process, will continue to follow Medicare Important Message given?   (If response is "NO", the following Medicare IM given date fields will be blank) Date Medicare IM given:   Date Additional Medicare IM given:    Discharge Disposition:    Per UR Regulation:  Reviewed for med. necessity/level of care/duration of stay  If discussed at [place] of Stay Meetings, dates discussed:    Comments:  04/16/12 1145 In to speak with pt. and spouse about Effient. Pt. states that he understands some [place].  Gave Effient Free 30 day card and co-pay card to pt. Paperwork for medication assistance for Effient placed on chart for physician to complete.  This NCM called main pharmacy to inquire of indigent fund eligibility. Pt. has not used fund. At dc pt can get free medications from [facility] up to $100.00.  NCM to follow for dc needs. Llana Aliment, RN, BSN Nurse Case Manager [phone]

## 2012-04-16 NOTE — Progress Notes (Signed)
TR BAND REMOVAL  LOCATION:  right radial  DEFLATED PER PROTOCOL:  yes  TIME BAND OFF / DRESSING APPLIED:   2000   SITE UPON ARRIVAL:   Level 1  SITE AFTER BAND REMOVAL:  Level 1  REVERSE ALLEN'S TEST:    positive  CIRCULATION SENSATION AND MOVEMENT:  Within Normal Limits  yes  COMMENTS:

## 2012-04-16 NOTE — Progress Notes (Signed)
CARDIAC REHAB PHASE I   PRE:  Rate/Rhythm: 81 SR    BP: sitting 148/94    SaO2:   MODE:  Ambulation: 1000 ft   POST:  Rate/Rhythm: 93 sr    BP: sitting 161/100     SaO2:   Tolerated well, no c/o. BP very high after walk. Do not think he has had meds yet. Ed completed through Optometrist. Voiced more understanding. Resources will still be main issue. Interested in [place] and requests his name be sent to [place]. Gave financial aid application and believe daughter can help him fill out form. Transportation is a problem as well.  [phone]  Stephen Lane CES, ACSM

## 2012-04-16 NOTE — Progress Notes (Signed)
Patient ID: Stephen Lane  male  WUJ:[id number]    DOB: 1960/12/15    DOA: 04/13/2012  PCP: William Hamburger, MD  Assessment/Plan: Principal Problem:  *Unstable angina with severe 2V- CAD, s/p PCI (DES) to LCX, mid RCA - s/p cardiac cath yesterday, cont ASA and effient  - on Lipitor, lisinopril, BB, statins  Active Problems:  Diabetes mellitus Uncontrolled, hemoglobin A1c 13.7 - started on NPH 70/30 today + SSI - d/w Diabetic coordinator, patient will need Reli-on Novolin 70/30 brand on prescription [facility] brand) and prescription for insulin syringes at DC    Normocytic anemia: stable  DVT Prophylaxis: SCD's  Code Status: FC  Disposition: tomorrow per cardiology rec's. Patient also needs PCP and [place] cardiology f/u appt at DC.       Subjective: No chest pain, shortness of breath, nausea, vomiting, diaphoresis, palpitations.  Objective: Weight change:   Intake/Output Summary (Last 24 hours) at 04/16/12 1533 Last data filed at 04/16/12 0200  Gross per 24 hour  Intake   1020 ml  Output      0 ml  Net   1020 ml   Blood pressure 128/77, pulse 72, temperature 98.1 F (36.7 C), temperature source Oral, resp. rate 18, height $RemoveBe'5\' 2"'kmFEfBFim$  (1.575 m), weight 75 kg (165 lb 5.5 oz), SpO2 98.00%.  Physical Exam: General: Alert and awake, oriented x3, not in any acute distress. HEENT: anicteric sclera, pupils reactive to light and accommodation, EOMI CVS: S1-S2 clear, no murmur rubs or gallops Chest: clear to auscultation bilaterally, no wheezing, rales or rhonchi Abdomen: soft nontender, nondistended, normal bowel sounds, no organomegaly Extremities: no cyanosis, clubbing or edema noted bilaterally Neuro: Cranial nerves II-XII intact, no focal neurological deficits  Lab Results: Basic Metabolic Panel:  Lab 95/62/13 0542 04/15/12 0505  NA 135 134*  K 3.6 3.8  CL 103 100  CO2 26 25  GLUCOSE 174* 234*  BUN 18 21  CREATININE 1.19 1.20  CALCIUM 7.9* 8.3*  MG -- --  PHOS --  --   Liver Function Tests:  Lab 04/13/12 1620  AST 25  ALT 17  ALKPHOS 145*  BILITOT 0.3  PROT 6.7  ALBUMIN 2.9*   CBC:  Lab 04/16/12 0542 04/15/12 0505 04/13/12 1620  WBC 6.3 8.0 --  NEUTROABS -- -- 3.7  HGB 11.2* 12.7* --  HCT 31.2* 35.0* --  MCV 86.4 85.2 --  PLT 218 256 --   Cardiac Enzymes:  Lab 04/14/12 2028 04/14/12 1410 04/14/12 1012 04/14/12 0742 04/14/12 0544 04/13/12 2240  CKTOTAL 124 102 -- 81 -- --  CKMB 4.4* 3.5 -- 3.1 -- --  CKMBINDEX -- -- -- -- -- --  TROPONINI -- -- <0.30 -- <0.30 <0.30   BNP: No components found with this basename: POCBNP:2 CBG:  Lab 04/16/12 1311 04/16/12 0811 04/15/12 2109 04/15/12 1144 04/15/12 0755  GLUCAP 129* 137* 263* 176* 211*     Micro Results: No results found for this or any previous visit (from the past 240 hour(s)).  Studies/Results: Dg Chest 2 View  04/13/2012  *RADIOLOGY REPORT*  Clinical Data: Chest pain.  Headache.  CHEST - 2 VIEW  Comparison: 09/06/2004  Findings: The film is made with shallow lung inflation.  Heart size is probably normal.  There are no focal consolidations or pleural effusions.  Mild perihilar bronchitic changes are present.  IMPRESSION:  1.  Shallow inflation. 2. No evidence for acute cardiopulmonary abnormality.   Original Report Authenticated By: Nolon Nations, M.D.     Medications: Scheduled Meds:    .  aspirin EC  81 mg Oral Daily  . atorvastatin  20 mg Oral q1800  . bd getting started take home kit  1 kit Other Once  . [COMPLETED] fentaNYL      . insulin aspart  0-9 Units Subcutaneous TID WC  . [COMPLETED] insulin aspart  3 Units Subcutaneous Once  . insulin aspart protamine-insulin aspart  10 Units Subcutaneous BID WC  . lisinopril  10 mg Oral Daily  . living well with diabetes book   Does not apply Once  . [COMPLETED] living well with diabetes book- in spanish   Does not apply Once  . metoprolol tartrate  12.5 mg Oral BID  . [COMPLETED] midazolam      . nitroGLYCERIN  0.5  inch Topical Q6H  . pantoprazole  40 mg Oral Q0600  . prasugrel  10 mg Oral Daily  . sodium chloride  3 mL Intravenous Q12H  . [DISCONTINUED] diazepam  5 mg Oral On Call  . [DISCONTINUED] insulin glargine  10 Units Subcutaneous QHS  . [DISCONTINUED] prasugrel  60 mg Oral Once  . [DISCONTINUED] sodium chloride  3 mL Intravenous Q12H  . [DISCONTINUED] sodium chloride  3 mL Intravenous Q12H      LOS: 3 days   RAI,RIPUDEEP M.D. [facility] 04/16/2012, 3:33 PM Pager: [phone]  If 7PM-7AM, please contact night-coverage [email or web address] Password [email or web address]

## 2012-04-16 NOTE — Progress Notes (Signed)
    Subjective:  No chest pain or dyspnea at the  Objective:  Vital Signs in the last 24 hours: Temp:  [97.7 F (36.5 C)-98.7 F (37.1 C)] 98.1 F (36.7 C) (11/12 0450) Pulse Rate:  [70-90] 70  (11/12 0450) Resp:  [17-21] 18  (11/12 0450) BP: (123-154)/(80-94) 123/80 mmHg (11/12 0450) SpO2:  [95 %-100 %] 95 % (11/12 0450) Weight:  [75 kg (165 lb 5.5 oz)] 75 kg (165 lb 5.5 oz) (11/12 0450)  Intake/Output from previous day: 11/11 0701 - 11/12 0700 In: 1020 [P.O.:400; I.V.:620] Out: -   Physical Exam: Pt is alert and oriented, NAD HEENT: normal Neck: JVP - normal Lungs: CTA bilaterally CV: RRR without murmur or gallop Abd: soft, NT, Positive BS, no hepatomegaly Ext: no C/C/E, distal pulses intact and equal. Right radial site clear. Skin: warm/dry no rash  Lab Results:  Basename 04/16/12 0542 04/15/12 0505  WBC 6.3 8.0  HGB 11.2* 12.7*  PLT 218 256    Basename 04/16/12 0542 04/15/12 0505  NA 135 134*  K 3.6 3.8  CL 103 100  CO2 26 25  GLUCOSE 174* 234*  BUN 18 21  CREATININE 1.19 1.20    Basename 04/14/12 1012 04/14/12 0544  TROPONINI <0.30 <0.30   Tele: Sinus rhythm, personally reviewed. No arrhythmia.  Assessment/Plan:  1. Unstable angina pectoris. The patient had severe 2 vessel coronary artery disease and was treated with PCI using drug-eluting stents. He should continue on aspirin and effient for at least 12 months. We will ask the case manager to see him in order to facilitate medication adherence.  2. Diabetes, uncontrolled. Followed by the internal medicine service. Will start an ACE inhibitor considering his coronary artery disease and acute coronary syndrome.  3. Hypercholesterolemia with marked hypertriglyceridemia. Likely related to uncontrolled diabetes. The patient has been started on a statin drug.  4. Hypertension. Will increase metoprolol to 25 mg twice daily and add lisinopril 10 mg daily.  Disposition: Will asked the case manager to see  him today. He needs to work with cardiac rehabilitation. I would recommend keeping him at least another 24 hours to facilitate education on his diabetes and coronary artery disease.  Sherren Mocha, M.D. 04/16/2012, 8:13 AM

## 2012-04-17 ENCOUNTER — Telehealth: Payer: Self-pay | Admitting: Cardiovascular Disease

## 2012-04-17 DIAGNOSIS — I200 Unstable angina: Secondary | ICD-10-CM

## 2012-04-17 DIAGNOSIS — E785 Hyperlipidemia, unspecified: Secondary | ICD-10-CM

## 2012-04-17 LAB — CBC
MCV: 85.8 fL (ref 78.0–100.0)
Platelets: 214 10*3/uL (ref 150–400)
RBC: 3.58 MIL/uL — ABNORMAL LOW (ref 4.22–5.81)
RDW: 13 % (ref 11.5–15.5)
WBC: 6.9 10*3/uL (ref 4.0–10.5)

## 2012-04-17 LAB — BASIC METABOLIC PANEL
CO2: 26 mEq/L (ref 19–32)
Calcium: 8.1 mg/dL — ABNORMAL LOW (ref 8.4–10.5)
Creatinine, Ser: 1.39 mg/dL — ABNORMAL HIGH (ref 0.50–1.35)
GFR calc Af Amer: 66 mL/min — ABNORMAL LOW (ref >90)
Sodium: 137 mEq/L (ref 135–145)

## 2012-04-17 LAB — GLUCOSE, CAPILLARY: Glucose-Capillary: 102 mg/dL — ABNORMAL HIGH (ref 70–99)

## 2012-04-17 LAB — FOLATE: Folate: 16.1 ng/mL (ref >5.4)

## 2012-04-17 MED ORDER — ASPIRIN 81 MG PO TBEC: 81.0000 mg | DELAYED_RELEASE_TABLET | Freq: Every day | ORAL | Status: DC

## 2012-04-17 MED ORDER — METOPROLOL TARTRATE 25 MG PO TABS: 25.0000 mg | ORAL_TABLET | Freq: Two times a day (BID) | ORAL | Status: DC

## 2012-04-17 MED ORDER — INSULIN NPH ISOPHANE & REGULAR (70-30) 100 UNIT/ML ~~LOC~~ SUSP: 10.0000 [IU] | Freq: Two times a day (BID) | SUBCUTANEOUS | Status: DC

## 2012-04-17 MED ORDER — BD GETTING STARTED TAKE HOME KIT: 1/2ML X 30G SYRINGES: 1.0000 | Freq: Once | Status: DC

## 2012-04-17 MED ORDER — LISINOPRIL 10 MG PO TABS: 10.0000 mg | ORAL_TABLET | Freq: Every day | ORAL | Status: DC

## 2012-04-17 MED ORDER — PRAVASTATIN SODIUM 40 MG PO TABS: 40.0000 mg | ORAL_TABLET | Freq: Every day | ORAL | Status: DC

## 2012-04-17 MED ORDER — "INSULIN SYRINGE 30G X 1/2"" 0.5 ML MISC": 100.0000 | Freq: Four times a day (QID) | Status: DC

## 2012-04-17 MED ORDER — PRASUGREL HCL 10 MG PO TABS: 10.0000 mg | ORAL_TABLET | Freq: Every day | ORAL | Status: DC

## 2012-04-17 MED ORDER — UNABLE TO FIND: Status: DC

## 2012-04-17 MED ORDER — METOPROLOL TARTRATE 25 MG PO TABS
25.0000 mg | ORAL_TABLET | Freq: Two times a day (BID) | ORAL | Status: DC
  Administered 2012-04-17: 25 mg via ORAL

## 2012-04-17 MED ORDER — LIVING WELL WITH DIABETES BOOK: 1.0000 | Freq: Once | Status: DC

## 2012-04-17 NOTE — Progress Notes (Signed)
Interpreter Lesle Chris for Discharge Instruction RN Mickel Baas

## 2012-04-17 NOTE — Progress Notes (Signed)
04/17/12 1130 In to speak with pt. and family with interpreter about medication assistance card.  Explained to pt. that there is an expiration date on the card, pt. can use the card a any of the pharmacies that are on the paper, and each rx cost $3.  In addition, this NCM called the [facility] to set up an appointment, and was told they would mail paperwork to the pt. and he would have to mail paperwork back to office to be set up for an appointment.  Questions answered appropriately.  Pt. to dc home today. Llana Aliment, RN, BSN Nurse Case Manager [phone]

## 2012-04-17 NOTE — Progress Notes (Signed)
    Subjective:  No chest pain or dyspnea.  Objective:  Vital Signs in the last 24 hours: Temp:  [97.8 F (36.6 C)-98.1 F (36.7 C)] 97.8 F (36.6 C) (11/13 0812) Pulse Rate:  [71-79] 74  (11/13 0812) Resp:  [13-17] 14  (11/13 0812) BP: (101-148)/(60-91) 132/82 mmHg (11/13 0812) SpO2:  [98 %-100 %] 99 % (11/13 0812) Weight:  [76.8 kg (169 lb 5 oz)] 76.8 kg (169 lb 5 oz) (11/13 0001)  Intake/Output from previous day: 11/12 0701 - 11/13 0700 In: 1420 [P.O.:1420] Out: -   Physical Exam: Pt is alert and oriented, NAD HEENT: normal Neck: JVP - normal Lungs: CTA bilaterally CV: RRR without murmur or gallop Abd: soft, NT, Positive BS, no hepatomegaly Ext: no C/C/E, distal pulses intact and equal Skin: warm/dry no rash  Lab Results:  Basename 04/17/12 0525 04/16/12 0542  WBC 6.9 6.3  HGB 11.0* 11.2*  PLT 214 218    Basename 04/17/12 0525 04/16/12 0542  NA 137 135  K 3.4* 3.6  CL 103 103  CO2 26 26  GLUCOSE 92 174*  BUN 19 18  CREATININE 1.39* 1.19    Basename 04/14/12 1012  TROPONINI <0.30    Cardiac Studies:   Tele: Sinus rhythm without significant arrhythmia  Assessment/Plan:  1. Unstable angina pectoris. The patient underwent TCI with drug-eluting stents in the left circumflex and right coronary arteries. He should continue aspirin and effient for 12 months. Effient assistance has been arranged. Appreciate the case manager's assistance.  2. Diabetes, uncontrolled. Patient is now on an ACE inhibitor. Appreciate the care of the internal medicine service.  3. Hypercholesterolemia with marked hypertriglyceridemia. Continue statin drug and treatment of diabetes.  4. Hypertension. Continue lisinopril. Increase metoprolol to 25 mg twice daily.  Disposition: From a cardiac standpoint the patient appears stable for discharge. We will arrange a transitional care visit so that he is seen within 7 days. He needs close followup.  Sherren Mocha, M.D. 04/17/2012,  9:23 AM

## 2012-04-17 NOTE — Progress Notes (Signed)
Pt given Rx for SL Nitro and Rx for Effient written with refills. Pt given 30-day Rx as well. Instructions on use given through interpreter. Suanne Marker Jaydis Duchene

## 2012-04-17 NOTE — Discharge Summary (Signed)
Physician Discharge Summary  Stephen Lane QQV:[id number] DOB: 01-Jun-1961 DOA: 04/13/2012  PCP: William Hamburger, MD  Admit date: 04/13/2012 Discharge date: 04/17/2012  Recommendations for Outpatient Follow-up:  1. Pt will need to follow up with PCP in 2 weeks post discharge 2. Follow up with Dr. Sherren Mocha in one week  Discharge Diagnoses:  Principal Problem:  *Chest pain Active Problems:  Diabetes mellitus without complication  EKG abnormalities  Normocytic anemia  GERD (gastroesophageal reflux disease)  Intermediate coronary syndrome  unstable angina -Status post PCI with drug-eluting stents to left circumflex and mid RCA -Due to the patient's indigent status, will try to Rx meds on [facility] $4 formulary -Will plan to discharge on pravastatin, lisinopril, metoprolol tartrate which are on the [place] manager also informs me that she will be providing the patient a card for the above generic medications for which he take to certain listed pharmacies to obtain for $3 monthly refills including novolin 70/30 -Documented also informed me that the family medicine clinic we'll also send paperwork for the patient to fill out for possible followup. The patient was instructed to check his now for this paperwork and to fill it out and mail it back after which he will be informed of a followup appointment -All of this was discussed with the patient in the presence of a Spanish interpreter. The patient expressed understanding -Regarding the patient's Effient, he will need to followup with his cardiologist regarding any changes or application for charity care. This was also explained to the patient through a Spanish interpreter -follow up with Dr. Burt Knack in 1 week Diabetes mellitus type 2 -Hemoglobin A1c 13.7 -Patient will check his sugars 4 times daily and record in log - He will take log to his primary care doctor for future adjustments of his insulin -Prescriptions for  syringes, lancets, strips and glucometer were given to the patient separately Hyperlipidemia -Due to affordability and the patient's insurance status, the patient will be discharged with pravastatin 40 mg daily  Discharge Condition: stable  Disposition:  Follow-up Information    Follow up with Richardson Dopp, PA. On 04/24/2012. (Blood work and follow up appointment at 8:30 am)    Contact information:   [address] [address] [phone]          Diet: cardiac Wt Readings from Last 3 Encounters:  04/17/12 76.8 kg (169 lb 5 oz)  04/17/12 76.8 kg (169 lb 5 oz)    History of present illness:  51 year old male with a history of diabetes not on medications presented with one week of substernal chest pain without any radiation. The patient had associated nausea, vomiting, diaphoresis with waxing and waning symptoms. He did not have any fevers, chills, coughing, lower extremity edema.  Hospital Course:  The patient was placed on Lovenox 1 mg per kilogram every 12 hours. Cardiology was consulted. After consultation with pharmacy, the patient was switched to heparin drip in preparation for catheterization. The patient's troponins were negative. His initial EKG showed T wave inversions in leads 3, aVF, V3 to V6. His repeat EKG eventually normalized. The patient underwent cardiac catheterization on 04/15/2012. It showed severe two-vessel disease. He was also noted to have modest disease in the LAD and diagonal branches.Because of the relatively focal nature of his disease, we elected to proceed with PCI. He was loaded with effient 60 mg on the table. Successful 2 vessel PCI , drug-eluting stents were utilized. The patient was placed on dual antiplatelet therapy  with the recommendation for an additional 12 months of dual antibiotic therapy. He will continue on Effient and aspirin. The patient was started on metoprolol tartrate 25 mg twice a day and lisinopril 10 mg daily.  He was continued on aspirin 81 mg daily. He was started on NovoLog 70/30,10 units twice a day. He has a scheduled followup with Dr. Sherren Mocha in one week we will also set up the patient for cardiac rehabilitation. Due to insurance issues and coughs, the patient was started on pravastatin at the time of discharge. Care management assisted the patient in providing the patient a medication card for 30 days of Effient as well as medication card for his other medications including metoprolol, lisinopril, pravastatin, and Novolin 70/30 which he can get for $3 /per prescription  every month. Attempts was made to set the patient for outpatient followup at that time in medicine clinic. Paperwork will be sent to the patient to fill out before a final appointment is made. The patient was educated on all his medications as well as followup in the presence of a Spanish interpreter. The patient expressed understanding.    Consultants: Dr. Legrand Como cooper  Discharge Exam: Filed Vitals:   04/17/12 0812  BP: 132/82  Pulse: 74  Temp: 97.8 F (36.6 C)  Resp: 14   Filed Vitals:   04/16/12 2034 04/17/12 0001 04/17/12 0600 04/17/12 0812  BP: 148/91 101/60 129/79 132/82  Pulse: 79 71 74 74  Temp: 98 F (36.7 C) 98 F (36.7 C) 98.1 F (36.7 C) 97.8 F (36.6 C)  TempSrc: Oral Oral Oral Oral  Resp: $Remo'17 16 13 14  'kaEnL$ Height:      Weight:  76.8 kg (169 lb 5 oz)    SpO2: 100% 98% 98% 99%   General: A&O x 3, NAD, pleasant, cooperative Cardiovascular: RRR, no rub, no gallop, no S3 Respiratory: CTAB, no wheeze, no rhonchi Abdomen:soft, nontender, nondistended, positive bowel sounds Extremities: trace edema, No lymphangitis, no petechiae  Discharge Instructions      Discharge Orders    Future Appointments: Provider: Department: Dept Phone: Center:   04/24/2012 8:30 AM Liliane Shi, [place] Main Office [place]) [phone] [facility]   04/24/2012 10:30 AM [facility]  Main Office [place]) [phone] [facility]     Future Orders Please Complete By Expires   Amb Referral to Cardiac Rehabilitation      Diet - low sodium heart healthy      Increase activity slowly      Discharge instructions      Comments:   Check blood sugars before each meal and at bedtime and record in log.  Take log to primary care doctor to make adjustments to insulin Get other prescriptions and glucometer at [facility]       Medication List     As of 04/17/2012 11:11 AM    TAKE these medications         aspirin 81 MG EC tablet   Take 1 tablet (81 mg total) by mouth daily.      bd getting started take home kit Misc   1 kit by Other route once.      insulin NPH-insulin regular (70-30) 100 UNIT/ML injection   Commonly known as: NOVOLIN 70/30   Inject 10 Units into the skin 2 (two) times daily with a meal. Give with breakfast and dinner      INSULIN SYRINGE .5CC/30GX1/2" 30G X 1/2" 0.5 ML Misc   100 Syringes by Does not  apply route QID.      lisinopril 10 MG tablet   Commonly known as: PRINIVIL,ZESTRIL   Take 1 tablet (10 mg total) by mouth daily.      living well with diabetes book Misc   1 each by Does not apply route once.      metoprolol tartrate 25 MG tablet   Commonly known as: LOPRESSOR   Take 1 tablet (25 mg total) by mouth 2 (two) times daily.      prasugrel 10 MG Tabs   Commonly known as: EFFIENT   Take 1 tablet (10 mg total) by mouth daily.      pravastatin 40 MG tablet   Commonly known as: PRAVACHOL   Take 1 tablet (40 mg total) by mouth daily.          The results of significant diagnostics from this hospitalization (including imaging, microbiology, ancillary and laboratory) are listed below for reference.    Significant Diagnostic Studies: Dg Chest 2 View  04/13/2012  *RADIOLOGY REPORT*  Clinical Data: Chest pain.  Headache.  CHEST - 2 VIEW  Comparison: 09/06/2004  Findings: The film is made with shallow lung inflation.  Heart size is  probably normal.  There are no focal consolidations or pleural effusions.  Mild perihilar bronchitic changes are present.  IMPRESSION:  1.  Shallow inflation. 2. No evidence for acute cardiopulmonary abnormality.   Original Report Authenticated By: Nolon Nations, M.D.      Microbiology: No results found for this or any previous visit (from the past 240 hour(s)).   Labs: Basic Metabolic Panel:  Lab 37/04/88 0525 04/16/12 0542 04/15/12 0505 04/14/12 2329 04/14/12 0742  NA 137 135 134* 133* 137  K 3.4* 3.6 -- -- --  CL 103 103 100 100 104  CO2 $Re'26 26 25 26 28  'hxH$ GLUCOSE 92 174* 234* 261* 345*  BUN $Re'19 18 21 22 22  'iFo$ CREATININE 1.39* 1.19 1.20 1.26 1.30  CALCIUM 8.1* 7.9* 8.3* 8.1* 7.9*  MG -- -- -- -- --  PHOS -- -- -- -- --   Liver Function Tests:  Lab 04/13/12 1620  AST 25  ALT 17  ALKPHOS 145*  BILITOT 0.3  PROT 6.7  ALBUMIN 2.9*   No results found for this basename: LIPASE:5,AMYLASE:5 in the last 168 hours No results found for this basename: AMMONIA:5 in the last 168 hours CBC:  Lab 04/17/12 0525 04/16/12 0542 04/15/12 0505 04/14/12 2329 04/14/12 0544 04/13/12 1620  WBC 6.9 6.3 8.0 7.0 7.0 --  NEUTROABS -- -- -- -- -- 3.7  HGB 11.0* 11.2* 12.7* 11.9* 11.1* --  HCT 30.7* 31.2* 35.0* 32.9* 31.1* --  MCV 85.8 86.4 85.2 86.1 85.2 --  PLT 214 218 256 219 215 --   Cardiac Enzymes:  Lab 04/14/12 2028 04/14/12 1410 04/14/12 1012 04/14/12 0742 04/14/12 0544 04/13/12 2240  CKTOTAL 124 102 -- 81 -- --  CKMB 4.4* 3.5 -- 3.1 -- --  CKMBINDEX -- -- -- -- -- --  TROPONINI -- -- <0.30 -- <0.30 <0.30   BNP: No components found with this basename: POCBNP:5 CBG:  Lab 04/17/12 0817 04/16/12 2202 04/16/12 1751 04/16/12 1311 04/16/12 0811  GLUCAP 102* 131* 181* 129* 137*    Time coordinating discharge:  Greater than 30 minutes  Signed:  Marlies Ligman, DO [facility] Pager: [phone] 04/17/2012, 11:11 AM

## 2012-04-17 NOTE — Progress Notes (Signed)
CARDIAC REHAB PHASE I   PRE:  Rate/Rhythm: 76 SR    BP: sitting 13475    SaO2:   MODE:  Ambulation: 1000 ft   POST:  Rate/Rhythm: 94 SR    BP: sitting 18092     SaO2:   Tolerated well, no c/o. Quick pace. BP after walk and sweating, he sts due to being nervous. Pt and wife understanding more and seem to have more access to meds. Planning to pursue CRPII and financial aid for it. [phone]  Darrick Meigs CES, ACSM

## 2012-04-17 NOTE — Progress Notes (Signed)
Possibly for d/c home today.  Has done well on 70/30 insulin 10 units bid with meals.  This may be a suitable amount of insulin to start patient on for home with outpatient follow-up as needed.  MD, patient will need Reli-on Novolin 70/30 brand on prescription [facility] brand) and prescription for insulin syringes at [facility].  Will follow. Wyn Quaker RN, MSN, CDE Diabetes Coordinator Inpatient Diabetes Program [phone]

## 2012-04-19 ENCOUNTER — Other Ambulatory Visit: Payer: Self-pay | Admitting: *Deleted

## 2012-04-19 DIAGNOSIS — I200 Unstable angina: Secondary | ICD-10-CM

## 2012-04-19 DIAGNOSIS — E785 Hyperlipidemia, unspecified: Secondary | ICD-10-CM

## 2012-04-24 ENCOUNTER — Ambulatory Visit (INDEPENDENT_AMBULATORY_CARE_PROVIDER_SITE_OTHER): Payer: 59 | Admitting: Physician Assistant

## 2012-04-24 ENCOUNTER — Other Ambulatory Visit (INDEPENDENT_AMBULATORY_CARE_PROVIDER_SITE_OTHER): Payer: 59

## 2012-04-24 ENCOUNTER — Telehealth: Payer: Self-pay | Admitting: *Deleted

## 2012-04-24 ENCOUNTER — Encounter: Payer: Self-pay | Admitting: Physician Assistant

## 2012-04-24 VITALS — BP 98/70 | HR 62 | Ht 62.0 in | Wt 150.8 lb

## 2012-04-24 DIAGNOSIS — E119 Type 2 diabetes mellitus without complications: Secondary | ICD-10-CM

## 2012-04-24 DIAGNOSIS — E785 Hyperlipidemia, unspecified: Secondary | ICD-10-CM

## 2012-04-24 DIAGNOSIS — R0989 Other specified symptoms and signs involving the circulatory and respiratory systems: Secondary | ICD-10-CM

## 2012-04-24 DIAGNOSIS — I2510 Atherosclerotic heart disease of native coronary artery without angina pectoris: Secondary | ICD-10-CM

## 2012-04-24 DIAGNOSIS — I200 Unstable angina: Secondary | ICD-10-CM

## 2012-04-24 DIAGNOSIS — I10 Essential (primary) hypertension: Secondary | ICD-10-CM | POA: Insufficient documentation

## 2012-04-24 LAB — BASIC METABOLIC PANEL
CO2: 27 mEq/L (ref 19–32)
Calcium: 9.2 mg/dL (ref 8.4–10.5)
Chloride: 102 mEq/L (ref 96–112)
Sodium: 135 mEq/L (ref 135–145)

## 2012-04-24 MED ORDER — LISINOPRIL 10 MG PO TABS: 5.0000 mg | ORAL_TABLET | Freq: Every day | ORAL | Status: DC

## 2012-04-24 NOTE — Patient Instructions (Addendum)
Your physician recommends that you return for lab work in: TODAY BMET  Your physician recommends that you return for lab work in: FLP/LFT @ [place] 05/21/12 @ 8:50 AM [place], Daniel, [facility] 05/21/12 @ 8:50 AM (DO NOT TAKE YOUR INSULIN THIS MORNING BEFORE APPT)  DECREASE LISINOPRIL TO 5 MG DAILY

## 2012-04-24 NOTE — Telephone Encounter (Signed)
s/w pt's sister [name] who is aware of creatinine high and pt to d/c lisinopril, bmet 11/22 AM, and to push fluids (water, juice), sister verbalized understanding today

## 2012-04-24 NOTE — Telephone Encounter (Signed)
Message copied by Michae Kava on Wed Apr 24, 2012  3:43 PM ------      Message from: [place], [place] T      Created: Wed Apr 24, 2012  1:28 PM       Creatinine high.      Stop Lisinopril.      Push fluids.      Repeat BMET Friday AM.      Richardson Dopp, PA-C  1:27 PM 04/24/2012

## 2012-04-24 NOTE — Progress Notes (Signed)
[address] [place], [place]  [address] Phone: [phone], Fax:  [phone]  Date:  04/24/2012   Name:  Stephen Lane   DOB:  03/24/1961   MRN:  [id number]  PCP:  William Hamburger, MD  Primary Cardiologist:  Dr. Sherren Mocha  Primary Electrophysiologist:  None    History of Present Illness: Stephen Lane is a 51 y.o. male who returns for follow up after recent admission to the hospital for unstable angina.  He was admitted 11/9-11/13. He presented with chest discomfort. Serial cardiac enzymes were negative. ECG was abnormal. He was set up for cardiac catheterization. LHC 04/15/12: pLAD 50%, oD1 75% (small), pCFX 40% followed by mCFX 95%, pRCA 60%, mRCA 90%, EF 55-65%. PCI 04/15/12: DES to the [facility] and DES to the [facility]. Dual antiplatelet therapy was recommended for one year. He was set up for assistance with Effient.  He presented to the office today diaphoretic.  He is here with his sister who helps interpret. He took his insulin this morning. He did not eat. We provided him with crackers and juice and he is feeling better prior to discharge. He's had some mild chest pain since discharge. However, he denies any recurrent angina. He denies exertional symptoms. He denies dyspnea. He denies orthopnea, PND or significant pedal edema.  Labs (11/13):   K 3.4, creatinine 1.39, ALT 17, Hgb 11, A1c 13.7   Wt Readings from Last 3 Encounters:  04/24/12 150 lb 12.8 oz (68.402 kg)  04/17/12 169 lb 5 oz (76.8 kg)  04/17/12 169 lb 5 oz (76.8 kg)     Past Medical History  Diagnosis Date  . Diabetes mellitus without complication   . Coronary atherosclerosis     a. admx with [place] 11/13 => LHC 04/15/12: pLAD 50%, oD1 75% (small), pCFX 40% followed by mCFX 95%, pRCA 60%, mRCA 90%, EF 55-65%. PCI 04/15/12: DES to the [facility] and DES to the [facility]  . Mixed hyperlipidemia     Current Outpatient Prescriptions  Medication Sig Dispense Refill  . aspirin EC 81 MG EC  tablet Take 1 tablet (81 mg total) by mouth daily.      . insulin NPH-insulin regular (NOVOLIN 70/30) (70-30) 100 UNIT/ML injection Inject 10 Units into the skin 2 (two) times daily with a meal. Give with breakfast and dinner  10 mL  12  . Insulin Syringe-Needle U-100 (INSULIN SYRINGE .5CC/30GX1/2") 30G X 1/2" 0.5 ML MISC 100 Syringes by Does not apply route QID.  100 each  11  . lisinopril (PRINIVIL,ZESTRIL) 10 MG tablet Take 1 tablet (10 mg total) by mouth daily.  30 tablet  3  . metoprolol tartrate (LOPRESSOR) 25 MG tablet Take 1 tablet (25 mg total) by mouth 2 (two) times daily.  60 tablet  3  . prasugrel (EFFIENT) 10 MG TABS Take 1 tablet (10 mg total) by mouth daily.  30 tablet  11  . pravastatin (PRAVACHOL) 40 MG tablet Take 1 tablet (40 mg total) by mouth daily.  30 tablet  11  . bd getting started take home kit MISC 1 kit by Other route once.  1 kit  0  . living well with diabetes book MISC 1 each by Does not apply route once.  1 each  0  . UNABLE TO FIND Stephen Lane was present in the hospital with her husband on 04/15/12 to 04/17/12.  Her presence was necessary for care of her husband.  1 Act  0  Allergies:  No Known Allergies  Social History:  The patient  reports that he has never smoked. He does not have any smokeless tobacco history on file. He reports that he does not drink alcohol or use illicit drugs.   ROS:  Please see the history of present illness.   All other systems reviewed and negative.   PHYSICAL EXAM: VS:  BP 98/70  Pulse 62  Ht $R'5\' 2"'xQ$  (1.575 m)  Wt 150 lb 12.8 oz (68.402 kg)  BMI 27.58 kg/m2 Well nourished, well developed, somewhat diaphoretic but in no acute distress HEENT: normal Neck: no JVD Cardiac:  normal S1, S2; RRR; no murmur Lungs:  clear to auscultation bilaterally, no wheezing, rhonchi or rales Abd: soft, nontender, no hepatomegaly Ext: no edema; right wrist without hematoma or bruit  Skin: warm and dry Neuro:  CNs 2-12 intact, no focal  abnormalities noted  EKG:  NSR, HR 62, normal axis, inferior Q waves, T wave inversions in 2, 3, aVF and V5-V6, no significant change when compared to prior tracing      ASSESSMENT AND PLAN:  1. Coronary Artery Disease:   He is stable without angina after recent 2 vessel PCI. We discussed the importance of dual antiplatelet therapy. He has apparently completed paperwork for Effient assistance. We will look into this further to make sure that the process is being completed. I will try to provide him with samples of Effient today as well. Followup with me in 4 weeks.  2. Hyperlipidemia:   Triglycerides were significantly elevated the hospital. He is now on pravastatin. Obtain lipids and LFTs in 4 weeks.  3. Diabetes Mellitus:   He is applying to be seen at the family practice clinic. We provided him with crackers and juice today due to his hypoglycemia. Sugar was 70 when we checked it. He is feeling better prior to discharge.   4. Hypertension:   His blood pressure somewhat soft. I will decrease his lisinopril to 5 mg daily.   Danton Sewer, PA-C  9:24 AM 04/24/2012

## 2012-04-26 ENCOUNTER — Telehealth: Payer: Self-pay | Admitting: *Deleted

## 2012-04-26 ENCOUNTER — Other Ambulatory Visit (INDEPENDENT_AMBULATORY_CARE_PROVIDER_SITE_OTHER): Payer: Self-pay

## 2012-04-26 DIAGNOSIS — I2510 Atherosclerotic heart disease of native coronary artery without angina pectoris: Secondary | ICD-10-CM

## 2012-04-26 LAB — BASIC METABOLIC PANEL
BUN: 31 mg/dL — ABNORMAL HIGH (ref 6–23)
Chloride: 102 mEq/L (ref 96–112)
Potassium: 5.2 mEq/L — ABNORMAL HIGH (ref 3.5–5.1)
Sodium: 134 mEq/L — ABNORMAL LOW (ref 135–145)

## 2012-04-26 NOTE — Telephone Encounter (Signed)
recording saying cannot lm right now try again later.

## 2012-04-26 NOTE — Telephone Encounter (Signed)
called x 3. I will try again on monday

## 2012-04-26 NOTE — Telephone Encounter (Signed)
Message copied by Michae Kava on Fri Apr 26, 2012  5:03 PM ------      Message from: [place], [place] T      Created: Fri Apr 26, 2012  1:41 PM       Creatinine better      K+ high - ? Hemolyzed      Make sure he is not taking K+ supplement.      Limit dietary K+      Check BMET again on Mon or Tues 11/25 or 11/26      Richardson Dopp, PA-C  1:41 PM 04/26/2012

## 2012-04-29 NOTE — Telephone Encounter (Signed)
s/w pt's daughter Stephen Lane who is aware of lab result and need repeat bmet due to ? hemolyzed. pt coming 11/26 for bmet no charge since last bmet hemolyzed. Stephen Lane gave another # to call if after 4:30 pm [phone] her sister's #. Pt not on K+ supp.

## 2012-04-30 ENCOUNTER — Other Ambulatory Visit (INDEPENDENT_AMBULATORY_CARE_PROVIDER_SITE_OTHER): Payer: Self-pay

## 2012-04-30 DIAGNOSIS — R0989 Other specified symptoms and signs involving the circulatory and respiratory systems: Secondary | ICD-10-CM

## 2012-04-30 DIAGNOSIS — I2510 Atherosclerotic heart disease of native coronary artery without angina pectoris: Secondary | ICD-10-CM

## 2012-04-30 LAB — BASIC METABOLIC PANEL
CO2: 24 mEq/L (ref 19–32)
Calcium: 9 mg/dL (ref 8.4–10.5)
Chloride: 99 mEq/L (ref 96–112)
Glucose, Bld: 266 mg/dL — ABNORMAL HIGH (ref 70–99)
Sodium: 132 mEq/L — ABNORMAL LOW (ref 135–145)

## 2012-05-01 ENCOUNTER — Telehealth: Payer: Self-pay | Admitting: *Deleted

## 2012-05-01 NOTE — Telephone Encounter (Signed)
Message copied by Michae Kava on Wed May 01, 2012  4:57 PM ------      Message from: [place], [place] T      Created: Tue Apr 30, 2012  5:04 PM       K+ and creatinine ok      Do not take Lisinopril for now.      Sugars high.  Likely staying somewhat dehydrated b/c of this.      Drink plenty of fluids.      Needs close follow up with PCP for diabetes.      Richardson Dopp, PA-C  5:03 PM 04/30/2012

## 2012-05-01 NOTE — Telephone Encounter (Signed)
could not lvm. I will try again on Friday 05/03/12

## 2012-05-03 ENCOUNTER — Telehealth: Payer: Self-pay | Admitting: *Deleted

## 2012-05-03 NOTE — Telephone Encounter (Signed)
daughter carmen notified about lab rsults and to have pt stop lisiniopril  and f/u w/PCP for DM

## 2012-05-03 NOTE — Telephone Encounter (Signed)
Message copied by Michae Kava on Fri May 03, 2012 11:39 AM ------      Message from: [place], [place] T      Created: Tue Apr 30, 2012  5:04 PM       K+ and creatinine ok      Do not take Lisinopril for now.      Sugars high.  Likely staying somewhat dehydrated b/c of this.      Drink plenty of fluids.      Needs close follow up with PCP for diabetes.      Richardson Dopp, PA-C  5:03 PM 04/30/2012

## 2012-05-21 ENCOUNTER — Encounter: Payer: Self-pay | Admitting: Physician Assistant

## 2012-05-21 ENCOUNTER — Ambulatory Visit (INDEPENDENT_AMBULATORY_CARE_PROVIDER_SITE_OTHER): Payer: Self-pay | Admitting: Physician Assistant

## 2012-05-21 ENCOUNTER — Ambulatory Visit: Payer: Self-pay | Admitting: Physician Assistant

## 2012-05-21 VITALS — BP 150/96 | HR 75 | Ht 62.0 in | Wt 157.6 lb

## 2012-05-21 DIAGNOSIS — I10 Essential (primary) hypertension: Secondary | ICD-10-CM

## 2012-05-21 DIAGNOSIS — E785 Hyperlipidemia, unspecified: Secondary | ICD-10-CM

## 2012-05-21 DIAGNOSIS — I2510 Atherosclerotic heart disease of native coronary artery without angina pectoris: Secondary | ICD-10-CM

## 2012-05-21 LAB — HEPATIC FUNCTION PANEL
ALT: 17 U/L (ref 0–53)
AST: 13 U/L (ref 0–37)
Albumin: 3.4 g/dL — ABNORMAL LOW (ref 3.5–5.2)
Total Bilirubin: 0.5 mg/dL (ref 0.3–1.2)
Total Protein: 7.2 g/dL (ref 6.0–8.3)

## 2012-05-21 LAB — BASIC METABOLIC PANEL
BUN: 34 mg/dL — ABNORMAL HIGH (ref 6–23)
CO2: 26 mEq/L (ref 19–32)
Chloride: 101 mEq/L (ref 96–112)
Creatinine, Ser: 1.5 mg/dL (ref 0.4–1.5)
Glucose, Bld: 200 mg/dL — ABNORMAL HIGH (ref 70–99)

## 2012-05-21 LAB — LIPID PANEL
Cholesterol: 172 mg/dL (ref 0–200)
Triglycerides: 275 mg/dL — ABNORMAL HIGH (ref 0.0–149.0)

## 2012-05-21 MED ORDER — CARVEDILOL 6.25 MG PO TABS: 6.2500 mg | ORAL_TABLET | Freq: Two times a day (BID) | ORAL | Status: DC

## 2012-05-21 NOTE — Patient Instructions (Addendum)
Your physician has recommended you make the following change in your medication: STOP METOPROLOL START COREG 6.25 MG 1 TABLET TWICE DAILY  LABS TODAY; BMET, FLP.LFT  PLEASE MAKE AN APPOINTMENT TO SEE DR. Burt Knack IN THE NEXT 2-3 MONTHS  OUR OFFICE WILL FILL OUT AN ASSISTANCE PLAN FOR EFFIENT FOR YOU; HOWEVER YOU WILL NEED TO PROVIDE [place] WITH CERTAIN FINANCIAL INFORMATION TO SEND IN FOR THE ASSISTANCE PROGRAM;

## 2012-05-21 NOTE — Progress Notes (Signed)
[address], [place], [place]  [address] Phone: [phone], Fax:  [phone]  Date:  05/21/2012   Name:  Stephen Lane   DOB:  1961-05-18   MRN:  [id number]  PCP:  William Hamburger, MD  Primary Cardiologist:  Dr. Sherren Mocha  Primary Electrophysiologist:  None    History of Present Illness: Stephen Lane is a 51 y.o. male who returns for follow up.  He was admitted last month with unstable angina. LHC demonstrated high-grade lesions in the mid CFX and mid RCA. He had moderate disease in a small ostial Dx as well as the proximal RCA, proximal CFX and proximal LAD. He underwent placement of a DES to the mid circumflex and a DES to mid RCA. I saw him back in followup 04/24/12. He was doing well aside for troubles with hypoglycemia. He is getting established with primary care at the outpatient clinic. We provided him with samples of Effient. He is applying for assistance. I also decreased his lisinopril due to hypotension. But, his creatinine returned high and we stopped his ACE with improvement in his renal fxn.  He is here with an interpreter today.  The patient denies any significant chest pain, shortness of breath, syncope, orthopnea, PND or significant pedal edema.   Labs (11/13):   K 3.4 => 5.2 => 4.5, creatinine 1.39 => 1.8 => 1.5, ALT 17, Hgb 11, A1c 13.7   Wt Readings from Last 3 Encounters:  05/21/12 157 lb 9.6 oz (71.487 kg)  04/24/12 150 lb 12.8 oz (68.402 kg)  04/17/12 169 lb 5 oz (76.8 kg)     Past Medical History  Diagnosis Date  . Diabetes mellitus without complication   . Coronary atherosclerosis     a. admx with [place] 11/13 => LHC 04/15/12: pLAD 50%, oD1 75% (small), pCFX 40% followed by mCFX 95%, pRCA 60%, mRCA 90%, EF 55-65%. PCI 04/15/12: DES to the [facility] and DES to the [facility].  . Mixed hyperlipidemia   . Essential hypertension     Current Outpatient Prescriptions  Medication Sig Dispense Refill  . aspirin EC 81 MG EC  tablet Take 1 tablet (81 mg total) by mouth daily.      . bd getting started take home kit MISC 1 kit by Other route once.  1 kit  0  . insulin NPH-insulin regular (NOVOLIN 70/30) (70-30) 100 UNIT/ML injection Inject 10 Units into the skin 2 (two) times daily with a meal. Give with breakfast and dinner  10 mL  12  . Insulin Syringe-Needle U-100 (INSULIN SYRINGE .5CC/30GX1/2") 30G X 1/2" 0.5 ML MISC 100 Syringes by Does not apply route QID.  100 each  11  . living well with diabetes book MISC 1 each by Does not apply route once.  1 each  0  . metoprolol tartrate (LOPRESSOR) 25 MG tablet Take 1 tablet (25 mg total) by mouth 2 (two) times daily.  60 tablet  3  . prasugrel (EFFIENT) 10 MG TABS Take 1 tablet (10 mg total) by mouth daily.  30 tablet  11  . pravastatin (PRAVACHOL) 40 MG tablet Take 1 tablet (40 mg total) by mouth daily.  30 tablet  11  . UNABLE TO FIND [name] was present in the hospital with her husband on 04/15/12 to 04/17/12.  Her presence was necessary for care of her husband.  1 Act  0    Allergies:  No Known Allergies  Social History:  The patient  reports that  he has never smoked. He does not have any smokeless tobacco history on file. He reports that he does not drink alcohol or use illicit drugs.   ROS:  Please see the history of present illness.  No bleeding problems.  All other systems reviewed and negative.   PHYSICAL EXAM: VS:  BP 150/96  Pulse 75  Ht $R'5\' 2"'ml$  (1.575 m)  Wt 157 lb 9.6 oz (71.487 kg)  BMI 28.83 kg/m2 Well nourished, well developed, in no acute distress HEENT: normal Neck: no JVD Cardiac:  normal S1, S2; RRR; no murmur Lungs:  clear to auscultation bilaterally, no wheezing, rhonchi or rales Abd: soft, nontender, no hepatomegaly Ext: no edema Skin: warm and dry Neuro:  CNs 2-12 intact, no focal abnormalities noted  EKG:  NSR, HR 76, normal axis, nonspecific ST-T wave changes      ASSESSMENT AND PLAN:  1. Coronary Artery Disease:   Doing well.   No angina.  Continue ASA and Effient.  We discussed the importance of dual antiplatelet therapy.  We have filled out Effient assistance forms again today and provided him samples.    2. Hyperlipidemia:   Continue Pravastatin.  Check Lipids and LFTs today.  3. Diabetes Mellitus:   He has an appt with his new PCP 06/03/12.  4. Hypertension:   Uncontrolled.  D/c Metoprolol.  Start Coreg 6.25 mg bid.  Consider adding an ACE back in the future for renal protection with diabetes.  Check follow up BMET today.  5. Disposition:   Follow up with Dr. Sherren Mocha in 2-3 mos.  Danton Sewer, PA-C  8:51 AM 05/21/2012

## 2012-05-23 ENCOUNTER — Other Ambulatory Visit: Payer: Self-pay | Admitting: *Deleted

## 2012-05-23 ENCOUNTER — Telehealth: Payer: Self-pay | Admitting: Cardiovascular Disease

## 2012-05-23 ENCOUNTER — Telehealth: Payer: Self-pay | Admitting: *Deleted

## 2012-05-23 DIAGNOSIS — I10 Essential (primary) hypertension: Secondary | ICD-10-CM

## 2012-05-23 DIAGNOSIS — I2510 Atherosclerotic heart disease of native coronary artery without angina pectoris: Secondary | ICD-10-CM

## 2012-05-23 MED ORDER — CARVEDILOL 6.25 MG PO TABS: 6.2500 mg | ORAL_TABLET | Freq: Two times a day (BID) | ORAL | Status: DC

## 2012-05-23 MED ORDER — OMEGA-3 FATTY ACIDS 1000 MG PO CAPS: 1.0000 g | ORAL_CAPSULE | Freq: Two times a day (BID) | ORAL | Status: DC

## 2012-05-23 NOTE — Telephone Encounter (Signed)
RX CALLED IN FOR FISH OIL 1000 MG BID # 60 X 11

## 2012-05-23 NOTE — Telephone Encounter (Signed)
tried tcb x 2 but no answer this time. I will send a results ltr to pt today

## 2012-05-23 NOTE — Telephone Encounter (Signed)
pt's contact Stephen Lane cb said she left her phone in her car. She has been notified about lab results and to start fish oil 1000 bid, L/L 08/26/12 day before [facility] appt 08/27/12, Stephen Lane verbalized understanding to all instructions

## 2012-05-23 NOTE — Telephone Encounter (Signed)
F/u   Returning call back to Jonni Sanger.

## 2012-05-23 NOTE — Telephone Encounter (Signed)
Message copied by Michae Kava on Thu May 23, 2012 11:30 AM ------      Message from: [place], [place] T      Created: Tue May 21, 2012  5:24 PM       Renal fxn stable.      Follow up with new PCP as planned in 2 weeks for management of DM2.      LDL looks good.      Add Fish Oil 1000 mg bid.      Check Lipids and LFTs in 3 mos.      Richardson Dopp, PA-C  5:24 PM 05/21/2012

## 2012-05-23 NOTE — Telephone Encounter (Signed)
recording stating "person not available at this time try call again later".

## 2012-05-23 NOTE — Telephone Encounter (Signed)
pt's contact Stephen Lane cb said she left her phone in her car. She has been notified about lab results and to start fish oil 1000 bid, L/L 08/26/12 day before [facility] appt 08/27/12, Stephen Lane verbalized understanding to all instructions

## 2012-06-03 ENCOUNTER — Encounter: Payer: Self-pay | Admitting: Family Medicine

## 2012-06-03 ENCOUNTER — Ambulatory Visit (INDEPENDENT_AMBULATORY_CARE_PROVIDER_SITE_OTHER): Payer: Self-pay | Admitting: Family Medicine

## 2012-06-03 VITALS — BP 158/90 | HR 84 | Ht 63.0 in | Wt 159.0 lb

## 2012-06-03 DIAGNOSIS — E119 Type 2 diabetes mellitus without complications: Secondary | ICD-10-CM

## 2012-06-03 DIAGNOSIS — I10 Essential (primary) hypertension: Secondary | ICD-10-CM

## 2012-06-03 DIAGNOSIS — E1149 Type 2 diabetes mellitus with other diabetic neurological complication: Secondary | ICD-10-CM

## 2012-06-03 DIAGNOSIS — E1142 Type 2 diabetes mellitus with diabetic polyneuropathy: Secondary | ICD-10-CM

## 2012-06-03 DIAGNOSIS — E1140 Type 2 diabetes mellitus with diabetic neuropathy, unspecified: Secondary | ICD-10-CM

## 2012-06-03 MED ORDER — INSULIN NPH ISOPHANE & REGULAR (70-30) 100 UNIT/ML ~~LOC~~ SUSP: SUBCUTANEOUS | Status: DC

## 2012-06-03 NOTE — Progress Notes (Signed)
Subjective:  Patient presents today to establish care. Chief complaint-noted and mainly here to discuss DM as referred by cardiology to establish primary doctor.    1. DIABETES Type II Medications taking and tolerating-yes taking relion 70/30 after breakfast and after lunch Blood Sugars per patient-fasting-140-250 (x2 has been 120), Before lunch 200-250, before dinner 200-250 Diet-has a booklet that he tries to follow, lots of fruits and vegetables, diet soda only, no sweet tea Regular Exercise-walks 2 miles daily  Last eye exam-unsure, needs one Last foot exam-today Last microalbumin/on ace inhibitor-creatinine appears to have not tolerated when seen by cardiology On Aspirin-yes Daily foot monitoring-yes  ROS- (yes in daytime and once at night)Polyuria/Polydipsia/nocturia, (6 years or more blurry vision previously had lost all left eye vision. Some double vision with letters) Vision changes, (no) feet or hand numbness/pain/tingling. Hypoglycemia symptoms (shaky, sweaty, hungry,  anxious, tremor, palpitations, confusion, behavior change)-no  Hemoglobin a1c:  Lab Results  Component Value Date   HGBA1C 13.7* 04/13/2012  this was before patient was started on insulin  THe following were reviewed and entered/updated in epic: Past Medical History  Diagnosis Date  . Diabetes mellitus without complication   . Coronary atherosclerosis     a. admx with [place] 11/13 => LHC 04/15/12: pLAD 50%, oD1 75% (small), pCFX 40% followed by mCFX 95%, pRCA 60%, mRCA 90%, EF 55-65%. PCI 04/15/12: DES to the [facility] and DES to the [facility].  . Mixed hyperlipidemia   . Essential hypertension   . Cataract, left eye     s/p laser   Past Surgical History  Procedure Date  . Eye surgery     "blood behind eyeball" 2007    Medications- reviewed and updated Reviewed problem list.  Allergies-reviewed and updated History   Social History  . Marital Status: Married    Number of Children: 2 children, 1 grandchild  lives with him  . Years of Education: 6th grade   Social History Main Topics  . Smoking status: Former Smoker -- 10 years  . Alcohol Use: No  . Sexually Active: None      ROS--See HPI   Objective:  BP 158/90  Pulse 84  Ht $R'5\' 3"'pb$  (1.6 m)  Wt 159 lb (72.122 kg)  BMI 28.17 kg/m2  General Appearance:    Alert, cooperative, no distress, appears stated age  Head:    Normocephalic, without obvious abnormality, atraumatic  Eyes:    PERRL, conjunctiva/corneas clear, EOM's intact       Ears:    Normal TM's and external ear canals, both ears  Nose:   Nares normal, septum midline, mucosa normal  Throat:   Very poor dentition with less than 10 teeth and receeding gumlines on teeth that are left. No obvious caries.   Lungs:     Clear to auscultation bilaterally, respirations unlabored  Heart:    Regular rate and rhythm, S1 and S2 normal, no murmur, rub   or gallop  Abdomen:     Soft, non-tender, bowel sounds active all four quadrants,    no masses, no organomegaly  Extremities:   Extremities normal, atraumatic, no cyanosis or edema  Pulses:   2+ and symmetric all extremities  Skin:   Skin color, texture, turgor normal, no rashes or lesions  DM foot exam:   Inconsistent detection of monofilament throughout soles of both feet, does have sensation on top of foot. Patient with dry feet and thickened nails.   Neurologic:   Normal strength, sensation outside of foot exam  Assessment/Plan: See problem oriented charted  WIll have patient back within the month to discuss blood pressure and lab testing.

## 2012-06-03 NOTE — Patient Instructions (Addendum)
Thank you for coming today.  Please increase your insulin to 15 units BEFORE breakfast and 10 units BEFORE dinner.  Once you get the orange card, we will send you to diabetes educator and the eye doctor.   I want to see you back within a month to talk about your blood pressure and follow up on your blood sugar.   Thanks, Dr. Yong Channel

## 2012-06-04 ENCOUNTER — Encounter: Payer: Self-pay | Admitting: Family Medicine

## 2012-06-04 DIAGNOSIS — E1140 Type 2 diabetes mellitus with diabetic neuropathy, unspecified: Secondary | ICD-10-CM | POA: Insufficient documentation

## 2012-06-04 NOTE — Assessment & Plan Note (Signed)
FOllow up in 1 month or as soon as has orange card to discuss further titration of insulin as well as BP management given elevation in office today. Appears to have had lisinopril stopped due to increase in creatinine and records show increase from 1.4 cr to 1.8 which is a 28% increase though other factors are difficult to account for such as the reported hypotension which may have had other causes. Would plan to consider retrial low dose ace at next visist.

## 2012-06-04 NOTE — Assessment & Plan Note (Signed)
Poor sensation on bottom of bilateral feet. Advised daily foot monitoring and supportive shoes. High risk for ulceration though none visualized.

## 2012-06-04 NOTE — Assessment & Plan Note (Addendum)
Poorly controlled based off of elevated preprandial CBGs. Will increase Am Novolin 70/30 to 15 units and keep PM dose the same at 10 units. Not candidate for metformin based off creatinine. ALready on aspirin.

## 2012-08-26 ENCOUNTER — Other Ambulatory Visit: Payer: Self-pay

## 2012-08-27 ENCOUNTER — Ambulatory Visit (INDEPENDENT_AMBULATORY_CARE_PROVIDER_SITE_OTHER): Payer: Self-pay | Admitting: Cardiovascular Disease

## 2012-08-27 ENCOUNTER — Encounter: Payer: Self-pay | Admitting: Cardiovascular Disease

## 2012-08-27 VITALS — BP 160/102 | HR 78 | Ht 63.0 in | Wt 168.0 lb

## 2012-08-27 DIAGNOSIS — I2510 Atherosclerotic heart disease of native coronary artery without angina pectoris: Secondary | ICD-10-CM

## 2012-08-27 MED ORDER — LISINOPRIL 10 MG PO TABS: 10.0000 mg | ORAL_TABLET | Freq: Every day | ORAL | Status: DC

## 2012-08-27 NOTE — Patient Instructions (Addendum)
Start Lisinopril $RemoveBeforeDE'10mg'fWgLLEEHlihicGJ$  daily.  Your physician recommends that you return for lab work in: 2 weeks - BMET.  Your physician recommends that you schedule a follow-up appointment in: 3 months with Truitt Merle, NP or Richardson Dopp, [place].

## 2012-08-27 NOTE — Progress Notes (Signed)
HPI:  52 year old gentleman presenting for followup evaluation. The patient presented with unstable angina in 2013 and underwent 2 vessel PCI with drug-eluting stents in his left circumflex and right coronary arteries.  Overall he is doing well. He is compliant with his medications. He was taken off of lisinopril a few months ago because of a mild increase in his creatinine. He complains of a superficial pain over the right chest wall. He's had no chest pain similar to that of his unstable angina. He denies shortness of breath, edema, orthopnea, or PND.  Outpatient Encounter Prescriptions as of 08/27/2012  Medication Sig Dispense Refill  . aspirin EC 81 MG EC tablet Take 1 tablet (81 mg total) by mouth daily.      . bd getting started take home kit MISC 1 kit by Other route once.  1 kit  0  . carvedilol (COREG) 6.25 MG tablet Take 1 tablet (6.25 mg total) by mouth 2 (two) times daily.  60 tablet  11  . fish oil-omega-3 fatty acids 1000 MG capsule Take 1 capsule (1 g total) by mouth 2 (two) times daily.  60 capsule  11  . insulin NPH-insulin regular (NOVOLIN 70/30) (70-30) 100 UNIT/ML injection Inject 15 units before breakfast and 10 units before dinner  10 mL  12  . Insulin Syringe-Needle U-100 (INSULIN SYRINGE .5CC/30GX1/2") 30G X 1/2" 0.5 ML MISC 100 Syringes by Does not apply route QID.  100 each  11  . prasugrel (EFFIENT) 10 MG TABS Take 1 tablet (10 mg total) by mouth daily.  30 tablet  11  . pravastatin (PRAVACHOL) 40 MG tablet Take 1 tablet (40 mg total) by mouth daily.  30 tablet  11  . UNABLE TO FIND [name] was present in the hospital with her husband on 04/15/12 to 04/17/12.  Her presence was necessary for care of her husband.  1 Act  0   No facility-administered encounter medications on file as of 08/27/2012.    No Known Allergies  Past Medical History  Diagnosis Date  . Diabetes mellitus without complication   . Coronary atherosclerosis     a. admx with [place] 11/13 => LHC  04/15/12: pLAD 50%, oD1 75% (small), pCFX 40% followed by mCFX 95%, pRCA 60%, mRCA 90%, EF 55-65%. PCI 04/15/12: DES to the [facility] and DES to the [facility].  . Mixed hyperlipidemia   . Essential hypertension   . Cataract, left eye     s/p laser    ROS: Negative except as per HPI  BP 160/102  Pulse 78  Ht $R'5\' 3"'ex$  (1.6 m)  Wt 76.204 kg (168 lb)  BMI 29.77 kg/m2  SpO2 99%  PHYSICAL EXAM: Pt is alert and oriented, NAD HEENT: normal Neck: JVP - normal, carotids 2+= without bruits Lungs: CTA bilaterally CV: RRR without murmur or gallop Chest: There is mild tenderness over the right anterior chest wall. There is no mass or rib deformity palpable. Abd: soft, NT, Positive BS, no hepatomegaly Ext: no C/C/E, distal pulses intact and equal Skin: warm/dry no rash  ASSESSMENT AND PLAN: 1. Coronary artery disease, native vessel. Continue aspirin and effient. He should take this for 12 months from the time of PCI. Continue pravastatin for lipid lowering. Recommend followup in 3 months.  2. Hypertension, uncontrolled. Add lisinopril 10 mg daily. Follow be met in 2 weeks. I would allow for a 25-30% increase in creatinine as this will provide long-term renal protection for this patient with diabetes.  3. Hyperlipidemia. Hypertriglyceridemia  is probably related to his diabetes. He will continue on pravastatin. He can stop fish oil.  Sherren Mocha 08/27/2012 2:23 PM

## 2012-09-10 ENCOUNTER — Other Ambulatory Visit: Payer: Self-pay

## 2012-10-09 ENCOUNTER — Encounter: Payer: Self-pay | Admitting: Family Medicine

## 2012-10-09 ENCOUNTER — Ambulatory Visit (INDEPENDENT_AMBULATORY_CARE_PROVIDER_SITE_OTHER): Payer: No Typology Code available for payment source | Admitting: Family Medicine

## 2012-10-09 VITALS — BP 146/87 | HR 80 | Temp 98.0°F | Ht 63.0 in | Wt 160.0 lb

## 2012-10-09 DIAGNOSIS — E119 Type 2 diabetes mellitus without complications: Secondary | ICD-10-CM

## 2012-10-09 DIAGNOSIS — E785 Hyperlipidemia, unspecified: Secondary | ICD-10-CM

## 2012-10-09 DIAGNOSIS — Z23 Encounter for immunization: Secondary | ICD-10-CM

## 2012-10-09 DIAGNOSIS — I10 Essential (primary) hypertension: Secondary | ICD-10-CM

## 2012-10-09 LAB — COMPREHENSIVE METABOLIC PANEL
Albumin: 3.6 g/dL (ref 3.5–5.2)
BUN: 30 mg/dL — ABNORMAL HIGH (ref 6–23)
CO2: 24 mEq/L (ref 19–32)
Calcium: 8.5 mg/dL (ref 8.4–10.5)
Chloride: 104 mEq/L (ref 96–112)
Creat: 1.57 mg/dL — ABNORMAL HIGH (ref 0.50–1.35)
Glucose, Bld: 157 mg/dL — ABNORMAL HIGH (ref 70–99)
Potassium: 4.7 mEq/L (ref 3.5–5.3)
Sodium: 138 mEq/L (ref 135–145)
Total Protein: 6.7 g/dL (ref 6.0–8.3)

## 2012-10-09 LAB — LIPID PANEL
Cholesterol: 187 mg/dL (ref 0–200)
HDL: 35 mg/dL — ABNORMAL LOW (ref >39)
Triglycerides: 270 mg/dL — ABNORMAL HIGH (ref <150)

## 2012-10-09 NOTE — Progress Notes (Signed)
Subjective:   # DIABETES Type II Medications taking and tolerating-yes taking relion 70/30 after breakfast and after lunch Blood Sugars per patient-fasting- typically 120 but up to 160. 96 the lowest x 1. 140s before lunch.   140-250 (x2 has been 120), Before lunch 200-250, before dinner 200-250 Diet-has a booklet that he tries to follow Regular Exercise-walks 2 miles daily  Last eye exam-unsure, needs one. Will order today.  Last foot exam-06/03/12 Last microalbumin/on ace inhibitor-creatinine appears to have not tolerated when seen by cardiology, started back on lisinopril On Aspirin-yes On statin-yes  Daily foot monitoring-yes  ROS- (yes in daytime and once at night)Polyuria/Polydipsia/nocturia, (6 years or more blurry vision previously had lost all left eye vision. Some double vision with letters) Vision changes, (no) feet or hand numbness/pain/tingling. Hypoglycemia symptoms (shaky, sweaty, hungry,  anxious, tremor, palpitations, confusion, behavior change)-only once due to not eating dinner but quickly resolved with banana  Hemoglobin a1c:  Lab Results  Component Value Date   HGBA1C 7.5 10/09/2012   HGBA1C 13.7* 04/13/2012    # Hypertension and Hyperlidemia BP Readings from Last 3 Encounters:  10/09/12 146/87  08/27/12 160/102  06/04/12 158/90  Home BP monitoring-no Compliant with medications-yes without side effects (lisinopril and carvedilol and pravastatin) Denies any CP, SOB,  LE edema, transient weakness, orthopnea, PND. Occasional AM headache. Has had blurry vision for some time but thinks he needs glasses. No myalgias.  ROS--See HPI  Past Medical History Patient Active Problem List   Diagnosis Date Noted  . Diabetic neuropathy 06/04/2012  . Coronary Artery Disease 04/24/2012  . Hypertension, essential 04/24/2012  . Diabetes Mellitus Type II, uncontrolled  04/24/2012  . Hyperlipidemia 04/17/2012  . GERD (gastroesophageal reflux disease) 04/15/2012  . Chest pain  04/13/2012  . Normocytic anemia 04/13/2012   Reviewed problem list.  Medications- reviewed and updated Chief complaint-noted  Objective: BP 146/87  Pulse 80  Temp(Src) 98 F (36.7 C) (Oral)  Ht $R'5\' 3"'zk$  (1.6 m)  Wt 160 lb (72.576 kg)  BMI 28.35 kg/m2 Gen: NAD, resting comfortably CV: RRR no murmurs rubs or gallops HEENT: Very poor dentition with less than 10 teeth and receeding gumlines on teeth that are left. No obvious caries.  Lungs: CTAB no crackles, wheeze, rhonchi Skin: warm, dry Ext: no edema Neuro: grossly normal, moves all extremities  Assessment/Plan:

## 2012-10-09 NOTE — Patient Instructions (Signed)
1. Increase insulin by 1 unit before breakfast and before dinner. Now 16 units and 11 units.  2. For blood pressure, I am checking some labs today to make sure one of the medicines isnt affecting your kidney too much. We may need to add another medicine.  3. We will refer you to eye doctor.   See me in 3 months,  Dr. Yong Channel

## 2012-10-10 MED ORDER — LISINOPRIL 20 MG PO TABS: 20.0000 mg | ORAL_TABLET | Freq: Every day | ORAL | Status: DC

## 2012-10-10 NOTE — Assessment & Plan Note (Signed)
Improved control.  Systolic still elevated. Preivously appeared cr didn't tolerate lisinopril but patient with minimal increase on it this time so will continue and double dose. Follow up in 2 weeks for BP check as may need additional agent. Would consider changing to lisinopril-hctz 20-12.5 as this would be same cost for patient.

## 2012-10-10 NOTE — Assessment & Plan Note (Addendum)
Much improved control with a1c 7.5.  Increase insulin by 1 unit in AM and PM (Relion 70/30 now 16 units and 11 units). Discussed signs and symptoms hypoglycemia.   Patient likely could tolerate metformin at current creatinine but given variability in the past, will hold off especially as titrating up lisinopril.

## 2012-10-10 NOTE — Assessment & Plan Note (Addendum)
Appreciate cardiology recs and at last visit they were ok with remaining on pravastatin. LDL <100 (falsely low likely due to triglycerides) but ideally with history CAD patient would have a high intensity statin (unfortunately too high cost for patient so will continue current medication for now). Could consider trying to get patient on MAP program though for crestor or lipitor. Will discuss with patient at 3 month follow up. Triglycerides trending down with better DM control and cardiology was ok with no fish oil.

## 2012-10-16 ENCOUNTER — Encounter: Payer: Self-pay | Admitting: Family Medicine

## 2012-11-27 ENCOUNTER — Encounter: Payer: Self-pay | Admitting: Physician Assistant

## 2012-11-27 ENCOUNTER — Ambulatory Visit (INDEPENDENT_AMBULATORY_CARE_PROVIDER_SITE_OTHER): Payer: Self-pay | Admitting: Physician Assistant

## 2012-11-27 VITALS — BP 136/94 | HR 79 | Ht 63.0 in | Wt 159.8 lb

## 2012-11-27 DIAGNOSIS — E119 Type 2 diabetes mellitus without complications: Secondary | ICD-10-CM

## 2012-11-27 DIAGNOSIS — I10 Essential (primary) hypertension: Secondary | ICD-10-CM

## 2012-11-27 DIAGNOSIS — I2510 Atherosclerotic heart disease of native coronary artery without angina pectoris: Secondary | ICD-10-CM

## 2012-11-27 DIAGNOSIS — E785 Hyperlipidemia, unspecified: Secondary | ICD-10-CM

## 2012-11-27 MED ORDER — CARVEDILOL 6.25 MG PO TABS: ORAL_TABLET | ORAL | Status: DC

## 2012-11-27 NOTE — Patient Instructions (Addendum)
Will obtain labs today and call you with the results (BMET)  INCREASE YOUR CARVEDILOL TO 6.25 MG 2 TWICE A DAY  Your physician recommends that you schedule a follow-up appointment in: [place]

## 2012-11-27 NOTE — Progress Notes (Signed)
[facility] [address], [place]  [address] Phone: [phone] Fax:  [phone]  Date:  11/27/2012   ID:  San, Lohmeyer 06/11/1960, MRN [id number]  PCP:  Garret Reddish, MD  Cardiologist:  Dr. Sherren Mocha     History of Present Illness: Stephen Lane is a 52 y.o. male who returns for f/u.  He was admitted 03/2012 with unstable angina. LHC demonstrated high-grade lesions in the mid CFX and mid RCA. He had moderate disease in a small ostial Dx as well as the proximal RCA, proximal CFX and proximal LAD. He underwent placement of a DES to the mid CFX and a DES to mid RCA.  Last seen by Dr. Burt Knack 08/2012. Blood pressure was uncontrolled and ACE inhibitor was added back to his medical regimen.  Creatinine has remained stable since.  He is seen with the assistance of an interpreter today.  The patient denies chest pain, shortness of breath, syncope, orthopnea, PND or significant pedal edema.   Labs (11/13): K 3.4 => 5.2 => 4.5, creatinine 1.39 => 1.8 => 1.5, ALT 17, Hgb 11, A1c 13.7  Labs (5/14):   K 4.7, Cr 1.57, ALT 16, LDL 98  Wt Readings from Last 3 Encounters:  11/27/12 159 lb 12.8 oz (72.485 kg)  10/09/12 160 lb (72.576 kg)  08/27/12 168 lb (76.204 kg)     Past Medical History  Diagnosis Date  . Diabetes mellitus without complication   . Coronary atherosclerosis     a. admx with [place] 11/13 => LHC 04/15/12: pLAD 50%, oD1 75% (small), pCFX 40% followed by mCFX 95%, pRCA 60%, mRCA 90%, EF 55-65%. PCI 04/15/12: DES to the [facility] and DES to the [facility].  . Mixed hyperlipidemia   . Essential hypertension   . Cataract, left eye     s/p laser    Current Outpatient Prescriptions  Medication Sig Dispense Refill  . aspirin EC 81 MG EC tablet Take 1 tablet (81 mg total) by mouth daily.      . carvedilol (COREG) 6.25 MG tablet Take 1 tablet (6.25 mg total) by mouth 2 (two) times daily.  60 tablet  11  . insulin NPH-regular (NOVOLIN 70/30) (70-30) 100  UNIT/ML injection Inject 16 units before breakfast and 11 units before dinner      . lisinopril (PRINIVIL,ZESTRIL) 20 MG tablet Take 1 tablet (20 mg total) by mouth daily.  30 tablet  0  . prasugrel (EFFIENT) 10 MG TABS Take 1 tablet (10 mg total) by mouth daily.  30 tablet  11  . pravastatin (PRAVACHOL) 40 MG tablet Take 1 tablet (40 mg total) by mouth daily.  30 tablet  11   No current facility-administered medications for this visit.    Allergies:   No Known Allergies  Social History:  The patient  reports that he has quit smoking. He has never used smokeless tobacco. He reports that he does not drink alcohol or use illicit drugs.   ROS:  Please see the history of present illness.    All other systems reviewed and negative.   PHYSICAL EXAM: VS:  BP 136/94  Pulse 79  Ht $R'5\' 3"'zQ$  (1.6 m)  Wt 159 lb 12.8 oz (72.485 kg)  BMI 28.31 kg/m2 Well nourished, well developed, in no acute distress HEENT: normal Neck: no JVD Vascular: no carotid bruits Cardiac:  normal S1, S2; RRR; no murmur Lungs:  clear to auscultation bilaterally, no wheezing, rhonchi or rales Abd: soft, nontender, no hepatomegaly  Ext: no edema Skin: warm and dry Neuro:  CNs 2-12 intact, no focal abnormalities noted  EKG:  NSR, HR 79, NSSTTW changes, no change from prior tracing.     ASSESSMENT AND PLAN:  1. CAD:  No angina.  Continue ASA, Effient, statin.  Would expect that he can d/c Effient 12 mos after DES to the CFX and RCA.  Will leave this up to Dr. Sherren Mocha.  2. Hypertension:  Better control.  Not yet to target.  Increase Coreg to 12.5 mg bid. 3. Hyperlipidemia:  Managed by PCP.  Recent LDL acceptable.  Continue statin. 4. Diabetes:  Continue f/u with PCP. 5. Disposition:  F/u with me in 6-8 weeks.  Will plan on f/u with Dr. Sherren Mocha in 04/2013.  Signed, Richardson Dopp, PA-C  11/27/2012 3:44 PM

## 2012-11-28 LAB — BASIC METABOLIC PANEL
BUN: 38 mg/dL — ABNORMAL HIGH (ref 6–23)
CO2: 26 mEq/L (ref 19–32)
Calcium: 9 mg/dL (ref 8.4–10.5)
Creatinine, Ser: 2 mg/dL — ABNORMAL HIGH (ref 0.4–1.5)
Glucose, Bld: 120 mg/dL — ABNORMAL HIGH (ref 70–99)

## 2012-11-29 ENCOUNTER — Telehealth: Payer: Self-pay | Admitting: *Deleted

## 2012-11-29 DIAGNOSIS — I10 Essential (primary) hypertension: Secondary | ICD-10-CM

## 2012-11-29 NOTE — Telephone Encounter (Signed)
Message copied by Michae Kava on Fri Nov 29, 2012 11:50 AM ------      Message from: [place], [place] T      Created: Thu Nov 28, 2012  1:43 PM       Creatinine slightly elevated.      Repeat BMET in 2 weeks.      Richardson Dopp, PA-C        11/28/2012 1:43 PM ------

## 2012-11-29 NOTE — Telephone Encounter (Signed)
lmptcb x 2 to go over lab results and repeat bmet to be done in 2 weeks

## 2012-11-29 NOTE — Telephone Encounter (Signed)
lmptcb to go over lab results and repeat bmet in 2 weeks. I will try again later today

## 2012-12-02 ENCOUNTER — Encounter: Payer: Self-pay | Admitting: *Deleted

## 2012-12-02 NOTE — Telephone Encounter (Signed)
lmptcb on the daughter's phone. I will send out a letter today as well, for bmet to be done on 7/11 2 weeks from result note.

## 2012-12-12 ENCOUNTER — Other Ambulatory Visit: Payer: Self-pay

## 2012-12-13 ENCOUNTER — Other Ambulatory Visit (INDEPENDENT_AMBULATORY_CARE_PROVIDER_SITE_OTHER): Payer: No Typology Code available for payment source

## 2012-12-13 DIAGNOSIS — I10 Essential (primary) hypertension: Secondary | ICD-10-CM

## 2012-12-13 LAB — BASIC METABOLIC PANEL
BUN: 27 mg/dL — ABNORMAL HIGH (ref 6–23)
Chloride: 110 mEq/L (ref 96–112)
Creatinine, Ser: 1.7 mg/dL — ABNORMAL HIGH (ref 0.4–1.5)

## 2012-12-18 ENCOUNTER — Telehealth: Payer: Self-pay | Admitting: *Deleted

## 2012-12-18 NOTE — Telephone Encounter (Signed)
Message copied by Earvin Hansen on Wed Dec 18, 2012  4:20 PM ------      Message from: [place], [place] T      Created: Mon Dec 16, 2012 10:43 AM       Creatinine stable      Continue with current treatment plan.      Richardson Dopp, PA-C        12/16/2012 10:42 AM ------

## 2012-12-18 NOTE — Telephone Encounter (Signed)
Advised daughter of labs

## 2012-12-27 ENCOUNTER — Encounter: Payer: Self-pay | Admitting: Family Medicine

## 2012-12-27 ENCOUNTER — Ambulatory Visit (INDEPENDENT_AMBULATORY_CARE_PROVIDER_SITE_OTHER): Payer: No Typology Code available for payment source | Admitting: Family Medicine

## 2012-12-27 VITALS — BP 162/88 | HR 75 | Temp 98.2°F | Wt 162.0 lb

## 2012-12-27 DIAGNOSIS — E119 Type 2 diabetes mellitus without complications: Secondary | ICD-10-CM

## 2012-12-27 DIAGNOSIS — I10 Essential (primary) hypertension: Secondary | ICD-10-CM

## 2012-12-27 LAB — BASIC METABOLIC PANEL
Calcium: 8.8 mg/dL (ref 8.4–10.5)
Glucose, Bld: 93 mg/dL (ref 70–99)
Sodium: 138 mEq/L (ref 135–145)

## 2012-12-27 MED ORDER — HYDROCHLOROTHIAZIDE 25 MG PO TABS: 25.0000 mg | ORAL_TABLET | Freq: Every day | ORAL | Status: DC

## 2012-12-27 NOTE — Assessment & Plan Note (Signed)
Poorly controlled in recent days but in general seems moderately well controlled. Hopeful for a1c <8 at recheck. Cr prohibits starting metformin which would be helpful.

## 2012-12-27 NOTE — Patient Instructions (Addendum)
Your blood pressure is a little high still so we are adding a medicine called hydrochlorothiazide for you to take once a day in addition to your carvedilol and lisinopril.   We are going to check some blood work and we may need to decrease your lisinopril dose if it is affecting your kidneys too much.  For your diabetes, I want you to increase your morning dose to 17 units and keep your night dose at 10 units.   We are going to get you set up with an eye doctor.   See me in 1 month,  Dr. Yong Channel

## 2012-12-27 NOTE — Progress Notes (Signed)
  [facility] Garret Reddish, MD Phone: [phone]  Subjective:   # Hypertension/CKD stage III BP Readings from Last 3 Encounters:  12/27/12 162/88  11/27/12 136/94  10/09/12 146/87  Home BP monitoring-no Compliant with medications-yes without side effects, lisinopril and carvedilol. Lisinopril was titrated up at last visit to 20.  Denies any CP, HA, SOB, blurry vision, LE edema, transient weakness, orthopnea, PND.   # DIABETES Type II Medications taking and tolerating-yes, 16 units 70/30 in AM and 10 in PM Blood Sugars per patient-fasting-tyipcally around 120 and checks 2 hours after lunch and usually around 140 but over last week admits to eating very poorly as he has been working from home and AM CBGs typically 200 over last 3 days.  Diet-usually eats reasonably, used books from hospitalization for DM for diet, but cannot find transportation for DM education Regular Exercise-yes, through work and tries to walk  Health Maintenance Due  Topic Date Due  . Ophthalmology Exam -has had vision issues for some time including having eye surgery in 0211 (uncertain which procedure). Does have some blurry vision especially if sugar is high. Awaiting optho appointment through project access.  05/24/1971  On Aspirin-yes On statin-yes, affordable at present and would like to keep current medication although stronger statin would be advisable   ROS- Endorses mild increase in following over last few days: Polyuria,Polydipsia, nocturia. Denies  Hypoglycemia symptoms (shaky, sweaty, hungry, weak anxious, tremor, palpitations, confusion, behavior change).   Hemoglobin a1c:  Lab Results  Component Value Date   HGBA1C 7.5 10/09/2012   HGBA1C 13.7* 04/13/2012   ROS--See HPI  Past Medical History Patient Active Problem List   Diagnosis Date Noted  . Coronary Artery Disease 04/24/2012    Priority: High  . Diabetes Mellitus Type II, uncontrolled  04/24/2012    Priority: High   . Hypertension, essential 04/24/2012    Priority: Medium  . Diabetic neuropathy 06/04/2012  . Hyperlipidemia 04/17/2012  . GERD (gastroesophageal reflux disease) 04/15/2012  . Chest pain 04/13/2012  . Normocytic anemia 04/13/2012   Reviewed problem list.  Medications- reviewed and updated Chief complaint-noted  Objective: BP 162/88  Pulse 75  Temp(Src) 98.2 F (36.8 C) (Oral)  Wt 162 lb (73.483 kg)  BMI 28.7 kg/m2 Gen: NAD, resting comfortably in chair, phone used for spanish interpreter CV: RRR no murmurs rubs or gallops Lungs: CTAB no crackles, wheeze, rhonchi Skin: warm, dry Neuro: grossly normal, moves all extremities Ext: trace edema R>L ankle with calf size equal 10 cm below tibial plateau.   Assessment/Plan:

## 2012-12-27 NOTE — Assessment & Plan Note (Signed)
Check BMET today to assess stability.

## 2012-12-27 NOTE — Assessment & Plan Note (Signed)
Poorly controlled. Will add HCTZ. Will check BMET as Cr has been variable up to 2 when seen by cardiology and most recently down to 1.7. I would consider changing ace-i therapy (at least lowering it) and adding another agent depending on creatinine. I do not think this elevation was necessarily due to ace-i increase at last visit though it is possible.

## 2013-01-02 ENCOUNTER — Telehealth: Payer: Self-pay | Admitting: Family Medicine

## 2013-01-02 NOTE — Telephone Encounter (Signed)
Cr trending up. Want patient to keep appointment for 1 month. We will recheck kidney function at that time. Precepted with Dr. Erin Hearing and plan will be to refer to renal at that time if still 1.7 or above and not back to baseline of near 1.5.

## 2013-01-03 NOTE — Telephone Encounter (Signed)
I called pt and LVM about dr. Yong Channel recommendation and appt day (08/29$RemoveBefore'@9'XgnqBYwJvwjgQ$ :00)  [place]

## 2013-01-21 ENCOUNTER — Ambulatory Visit (INDEPENDENT_AMBULATORY_CARE_PROVIDER_SITE_OTHER): Payer: No Typology Code available for payment source | Admitting: Physician Assistant

## 2013-01-21 ENCOUNTER — Encounter: Payer: Self-pay | Admitting: Physician Assistant

## 2013-01-21 VITALS — BP 130/80 | HR 74 | Ht 63.0 in | Wt 162.0 lb

## 2013-01-21 DIAGNOSIS — I2510 Atherosclerotic heart disease of native coronary artery without angina pectoris: Secondary | ICD-10-CM

## 2013-01-21 DIAGNOSIS — I10 Essential (primary) hypertension: Secondary | ICD-10-CM

## 2013-01-21 DIAGNOSIS — R5383 Other fatigue: Secondary | ICD-10-CM

## 2013-01-21 DIAGNOSIS — N183 Chronic kidney disease, stage 3 unspecified: Secondary | ICD-10-CM

## 2013-01-21 DIAGNOSIS — E785 Hyperlipidemia, unspecified: Secondary | ICD-10-CM

## 2013-01-21 DIAGNOSIS — R5381 Other malaise: Secondary | ICD-10-CM

## 2013-01-21 MED ORDER — CARVEDILOL 6.25 MG PO TABS: ORAL_TABLET | ORAL | Status: DC

## 2013-01-21 NOTE — Progress Notes (Signed)
[name] [address], [address] [place], [place]  [address] Phone: [phone] Fax:  [phone]  Date:  01/21/2013   ID:  Stephen Lane, DOB [birth year], MRN [id number]  PCP:  Garret Reddish, MD  Cardiologist:  Dr. Sherren Mocha     History of Present Illness: Stephen Lane is a 52 y.o. male who returns for f/u.  He has a hx of CAD, DM2, HTH, HL.  He was admitted 03/2012 with [place] demonstrated high-grade lesions in the mid CFX and mid RCA. He had moderate disease in a small ostial Dx as well as the proximal RCA, proximal CFX and proximal LAD. He underwent placement of a DES to the mid CFX and a DES to mid RCA.    Last seen by me in 11/27/12. He returns for further medication adjustments for his BP.  Since last seen, his PCP has added HCTZ.  He denies CP, SOB, syncope, orthopnea, PND, significant edema.  He does note fatigue.  He is not sure if he snores.  He thinks he may have started feeling this way after I increased his Coreg last visit.    Labs (11/13): K 3.4 => 5.2 => 4.5, creatinine 1.39 => 1.8 => 1.5, ALT 17, Hgb 11, A1c 13.7  Labs (5/14):   K 4.7, Cr 1.57, ALT 16, LDL 98 Labs (6/14):   K 4.6, Cr 2.0 Labs (7/14):   K 4.6 =>5.2, Cr 1.7 => 1.87  Wt Readings from Last 3 Encounters:  12/27/12 162 lb (73.483 kg)  11/27/12 159 lb 12.8 oz (72.485 kg)  10/09/12 160 lb (72.576 kg)     Past Medical History  Diagnosis Date  . Diabetes mellitus without complication   . Coronary atherosclerosis     a. admx with [place] 11/13 => LHC 04/15/12: pLAD 50%, oD1 75% (small), pCFX 40% followed by mCFX 95%, pRCA 60%, mRCA 90%, EF 55-65%. PCI 04/15/12: DES to the [facility] and DES to the [facility].  . Mixed hyperlipidemia   . Essential hypertension   . Cataract, left eye     s/p laser  . CKD (chronic kidney disease) stage 3, GFR 30-59 ml/min     Current Outpatient Prescriptions  Medication Sig Dispense Refill  . aspirin EC 81 MG EC tablet Take 1 tablet (81 mg total) by mouth  daily.      . carvedilol (COREG) 6.25 MG tablet 2 TABLETS TWICE A DAY  120 tablet  11  . hydrochlorothiazide (HYDRODIURIL) 25 MG tablet Take 1 tablet (25 mg total) by mouth daily.  30 tablet  11  . insulin NPH-regular (NOVOLIN 70/30) (70-30) 100 UNIT/ML injection Inject 16 units before breakfast and 11 units before dinner      . lisinopril (PRINIVIL,ZESTRIL) 20 MG tablet Take 1 tablet (20 mg total) by mouth daily.  30 tablet  0  . prasugrel (EFFIENT) 10 MG TABS Take 1 tablet (10 mg total) by mouth daily.  30 tablet  11  . pravastatin (PRAVACHOL) 40 MG tablet Take 1 tablet (40 mg total) by mouth daily.  30 tablet  11   No current facility-administered medications for this visit.    Allergies:   No Known Allergies  Social History:  The patient  reports that he has quit smoking. He has never used smokeless tobacco. He reports that he does not drink alcohol or use illicit drugs.   ROS:  Please see the history of present illness.    All other systems reviewed and negative.  PHYSICAL EXAM: VS:  BP 130/80  Pulse 74  Ht $R'5\' 3"'zv$  (1.6 m)  Wt 162 lb (73.483 kg)  BMI 28.7 kg/m2 Well nourished, well developed, in no acute distress HEENT: normal Neck: no JVD Cardiac:  normal S1, S2; RRR; no murmur Lungs:  clear to auscultation bilaterally, no wheezing, rhonchi or rales Abd: soft, nontender, no hepatomegaly Ext: no edema Skin: warm and dry Neuro:  CNs 2-12 intact, no focal abnormalities noted  EKG:  NSR, HR 74, NSSTTW changes, no change from prior tracing.     ASSESSMENT AND PLAN:  1. CAD:  No angina.  Continue ASA, Effient, statin.  Will try to get him Effient samples today.    2. Fatigue:  Question if this is related to his beta blocker.  Will adjust Coreg back to 6.25 mg bid.  Check BP with the RN in one week.  If fatigue improved and BP elevated, he may need addition of another agent. Check BMET, CBC, TSH.  Will have his wife note +/- snoring.  If he is snoring, he will need a sleep study.    3. Hypertension:  Adjustments and follow up as noted above.  4. Hyperlipidemia:  Continue statin. 5. Diabetes:  Continue f/u with PCP. 6. Disposition:  F/u with Dr. Sherren Mocha in 04/2013.  Signed, Richardson Dopp, PA-C  01/21/2013 2:04 PM

## 2013-01-21 NOTE — Patient Instructions (Addendum)
DECREASE COREG TO 6.25 MG TWICE DAILY ( 1 TABLET 6.25 MG TWICE DAILY)  YOU HAVE A NURSE VISIT FOR A BLOOD PRESSURE CHECK ON 01/30/13 @ 9 AM  LAB TODAY; BMET, CBC W/DIFF, TSH  FOLLOW UP WITH DR. Burt Knack IN 04/23/13 @ 4:15  HAVE YOUR WIFE TELL YOU IF YOU ARE SNORING AT NIGHT; IF YOU ARE PLEASE CALL [phone] PER SCOTT WEAVER, PAC.

## 2013-01-22 ENCOUNTER — Telehealth: Payer: Self-pay | Admitting: *Deleted

## 2013-01-22 LAB — CBC WITH DIFFERENTIAL/PLATELET
Basophils Relative: 0.6 % (ref 0.0–3.0)
Eosinophils Absolute: 0.5 10*3/uL (ref 0.0–0.7)
Lymphocytes Relative: 30.6 % (ref 12.0–46.0)
MCHC: 35 g/dL (ref 30.0–36.0)
Monocytes Relative: 6.3 % (ref 3.0–12.0)
Neutrophils Relative %: 54.7 % (ref 43.0–77.0)
RBC: 3.67 Mil/uL — ABNORMAL LOW (ref 4.22–5.81)
WBC: 6.6 10*3/uL (ref 4.5–10.5)

## 2013-01-22 LAB — BASIC METABOLIC PANEL
CO2: 25 mEq/L (ref 19–32)
Calcium: 8.4 mg/dL (ref 8.4–10.5)
Creatinine, Ser: 2.1 mg/dL — ABNORMAL HIGH (ref 0.4–1.5)
GFR: 36.19 mL/min — ABNORMAL LOW (ref >60.00)

## 2013-01-22 NOTE — Telephone Encounter (Signed)
Message copied by Michae Kava on Wed Jan 22, 2013  5:54 PM ------      Message from: [place], [place] T      Created: Wed Jan 22, 2013  4:02 PM       K+ ok      Creatinine stable      Hgb stable      TSH normal      Continue with current treatment plan.      Richardson Dopp, PA-C        01/22/2013 4:02 PM ------

## 2013-01-22 NOTE — Telephone Encounter (Signed)
lmptcb go over lab results. K+ ok, Creatinine stable, Hgb stable, TSH normal, Continue with current treatment plan per Brynda Rim. PAC

## 2013-01-30 ENCOUNTER — Ambulatory Visit (INDEPENDENT_AMBULATORY_CARE_PROVIDER_SITE_OTHER): Payer: No Typology Code available for payment source | Admitting: *Deleted

## 2013-01-30 VITALS — BP 126/84 | HR 68 | Ht 63.0 in | Wt 162.0 lb

## 2013-01-30 DIAGNOSIS — R5381 Other malaise: Secondary | ICD-10-CM

## 2013-01-30 DIAGNOSIS — R5383 Other fatigue: Secondary | ICD-10-CM

## 2013-01-30 NOTE — Progress Notes (Signed)
PT  CAME IN FOR B/P  CHECK SEE VITAL SIGNS  ALSO   ASKED PT  IF WIFE  NOTES  SNORING  ANSWERED NO  NOT AT THIS TIME WILL KEEP APPT  WITH  DR COOPER AS SCHEDULED .

## 2013-01-31 ENCOUNTER — Encounter: Payer: Self-pay | Admitting: Family Medicine

## 2013-01-31 ENCOUNTER — Ambulatory Visit (INDEPENDENT_AMBULATORY_CARE_PROVIDER_SITE_OTHER): Payer: No Typology Code available for payment source | Admitting: Family Medicine

## 2013-01-31 VITALS — BP 166/98 | HR 78 | Temp 98.1°F | Ht 63.0 in | Wt 160.0 lb

## 2013-01-31 DIAGNOSIS — I10 Essential (primary) hypertension: Secondary | ICD-10-CM

## 2013-01-31 DIAGNOSIS — E119 Type 2 diabetes mellitus without complications: Secondary | ICD-10-CM

## 2013-01-31 DIAGNOSIS — Z23 Encounter for immunization: Secondary | ICD-10-CM

## 2013-01-31 LAB — POCT GLYCOSYLATED HEMOGLOBIN (HGB A1C): Hemoglobin A1C: 7.9

## 2013-01-31 NOTE — Patient Instructions (Signed)
Your blood pressure is high again today but it looked great yesterday so let's have you in for a nurse check in 1 week.   Blood sugar seems much better. Keep taking 16 units in the morning and 11 at night. Your a1c was not back quite.   We are going to refer you to the kidney doctor.   We are still waiting on the eye doctor appointment.   You got your flu shot today.   See me in 1 month if we end up having to make blood pressure adjustments,  Dr. Yong Channel

## 2013-02-03 NOTE — Assessment & Plan Note (Addendum)
GFR worsening and now nearing STage IV (still above 30 though). Need to continue control of CBGs and blood pressure. Attempted referral to renal but patient is out of orange card in less than a month and no current openings for renal anyway. Once patient gets orange card, will place referral again and then await project access opening. Continue ace-i for now for renal protective effects.   At next appointment, will need to make sure patient knows to avoid nsaids. ALso, will discuss protecting his nondominant hand for blood draws.

## 2013-02-03 NOTE — Assessment & Plan Note (Signed)
a1c trended up slightly to 7.9 (but with period of dietary indescretions recently discussed and improved upon). I think a goal of 8 is reasonable in patient given we cannot use metformin. Would expecte needs more of a 2/3 to 1/3 ratio but fasting sugars honestly indicate could titrate up on nightly dose whereas 2 hour post prandials of 12-130 make me hesitant to increase AM insulin (though 17 or 18 with planned reduction if any hypoglycemia at next visit would be reasonable).

## 2013-02-03 NOTE — Progress Notes (Signed)
[facility] Garret Reddish, MD Phone: [phone]  Subjective:   # Hypertension/CKD stage III BP Readings from Last 3 Encounters:  02/03/13 166/98  01/30/13 126/84  01/21/13 130/80  Home BP monitoring-no Compliant with medications-yes without side effects, lisinopril 20mg  and hctz 25mg  daily and  carvedilol 6.25mg  BID . Denies any CP, HA, SOB, blurry vision, LE edema, transient weakness, orthopnea, PND.  Blood pressure was just checked yesterday at cardiology office and well controlle.d 171/103 initially here and 166/98 on my repeat. Patient states he took all meds this morning.   # DIABETES Type II Medications taking and tolerating-yes, 16 units 70/30 in AM and 11 in PM Blood Sugars per patient-fasting-tyipcally around 140 and checks 2 hours after lunch and usually around 120-130 . Regular Exercise-yes, through work and tries to walk  Health Maintenance Due  Topic Date Due  . Ophthalmology Exam -has had vision issues for some time including having eye surgery in 8469 (uncertain which procedure). Does have some blurry vision especially if sugar is high. Awaiting optho appointment through project access. Offered retinal scan in office but would have to pay.  05/24/1969  On Aspirin-yes On statin-yes, affordable at present and would like to keep current medication although stronger statin would be advisable  ROS- Denies  Polyuria,Polydipsia, nocturia. Denies  Hypoglycemia symptoms (shaky, sweaty, hungry, weak anxious, tremor, palpitations, confusion, behavior change).   Hemoglobin a1c:  Lab Results  Component Value Date   HGBA1C 7.9 01/31/2013   HGBA1C 7.5 10/09/2012   HGBA1C 13.7* 04/13/2012   ROS--See HPI  Past Medical History Patient Active Problem List   Diagnosis Date Noted  . Coronary Artery Disease 04/24/2012    Priority: High  . Diabetes Mellitus Type II, uncontrolled  04/24/2012    Priority: High  . Hypertension, essential 04/24/2012    Priority:  Medium  . CKD (chronic kidney disease), stage III 12/27/2012  . Diabetic neuropathy 06/04/2012  . Hyperlipidemia 04/17/2012  . GERD (gastroesophageal reflux disease) 04/15/2012  . Chest pain 04/13/2012  . Normocytic anemia 04/13/2012   Reviewed problem list.  Medications- reviewed and updated Current Outpatient Prescriptions on File Prior to Visit  Medication Sig Dispense Refill  . aspirin EC 81 MG EC tablet Take 1 tablet (81 mg total) by mouth daily.      . carvedilol (COREG) 6.25 MG tablet 1 TABLETS TWICE A DAY      . hydrochlorothiazide (HYDRODIURIL) 25 MG tablet Take 1 tablet (25 mg total) by mouth daily.  30 tablet  11  . insulin NPH-regular (NOVOLIN 70/30) (70-30) 100 UNIT/ML injection Inject 16 units before breakfast and 11 units before dinner      . lisinopril (PRINIVIL,ZESTRIL) 20 MG tablet Take 1 tablet (20 mg total) by mouth daily.  30 tablet  0  . prasugrel (EFFIENT) 10 MG TABS Take 1 tablet (10 mg total) by mouth daily.  30 tablet  11  . pravastatin (PRAVACHOL) 40 MG tablet Take 1 tablet (40 mg total) by mouth daily.  30 tablet  11   No current facility-administered medications on file prior to visit.    Chief complaint-noted  Objective: BP 166/98  Pulse 78  Temp(Src) 98.1 F (36.7 C) (Oral)  Ht 5\' 3"  (1.6 m)  Wt 160 lb (72.576 kg)  BMI 28.35 kg/m2 Gen: NAD, resting comfortably in chair, interpreter present CV: RRR no murmurs rubs or gallops Lungs: CTAB no crackles, wheeze, rhonchi Skin: warm, dry Neuro: grossly normal, moves all extremities Ext: trace edema bilaterally  Assessment/Plan:

## 2013-02-03 NOTE — Assessment & Plan Note (Signed)
Poorly controlled today when well controlled yesterday. Given discrepancy, will have patient back for nurse visit within a week. Would consider adding calcium channel blocker. Very important to control blood pressure given CKD which has been worsening.

## 2013-02-07 ENCOUNTER — Ambulatory Visit: Payer: No Typology Code available for payment source

## 2013-02-12 ENCOUNTER — Ambulatory Visit: Payer: No Typology Code available for payment source

## 2013-02-19 ENCOUNTER — Ambulatory Visit: Payer: Self-pay | Admitting: Family Medicine

## 2013-03-12 ENCOUNTER — Encounter: Payer: No Typology Code available for payment source | Admitting: Family Medicine

## 2013-03-12 ENCOUNTER — Ambulatory Visit: Payer: No Typology Code available for payment source | Admitting: Family Medicine

## 2013-03-20 ENCOUNTER — Ambulatory Visit (INDEPENDENT_AMBULATORY_CARE_PROVIDER_SITE_OTHER): Payer: No Typology Code available for payment source | Admitting: Family Medicine

## 2013-03-20 ENCOUNTER — Encounter: Payer: Self-pay | Admitting: Family Medicine

## 2013-03-20 VITALS — BP 170/100 | HR 78 | Temp 98.1°F | Ht 63.0 in | Wt 165.0 lb

## 2013-03-20 DIAGNOSIS — E109 Type 1 diabetes mellitus without complications: Secondary | ICD-10-CM

## 2013-03-20 DIAGNOSIS — I10 Essential (primary) hypertension: Secondary | ICD-10-CM

## 2013-03-20 NOTE — Assessment & Plan Note (Signed)
Elevated today as did not take medicine before coming in today. Will have back for nurse recheck in 1 week after he has taken medicine. Forgets 2x a month typically and counseled patient for his kidneys and history CAD extremely important that he not forget.

## 2013-03-20 NOTE — Patient Instructions (Signed)
For blood pressure Keep taking all medications Come back 1 morning in the next week after you take your blood pressure medicine for a check with our nurses.   For Diabetes Keep taking 16 units in the morning and continue taking 11 units at night We do not want you to have any low blood sugars. Let us know if you have any sugars less than 100.   For Kidneys Avoid any medicine such as ibuprofen, aleve, motrin, aleve.  You can take tylenol only.  We will check your function in December.  Blood draws only through right arm.   See me in December, Dr. 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 La hipoglucemia puede suceder por muchas razones. La hipoglucemia puede ocurrir [employer] con o sin diabetes. La hipoglucemia puede comenzar sbitamente y ser una emergencia mdica.  CAUSAS Hipoglucemia no significa que tiene diabetes. Las causas ms frecuentes son:  Retardar o IT consultant comidas o no comer la suficiente cantidad de carbohidratos.  Sobredosis de medicamentos. Esto podra ser por accidente o deliberadamente. Si por accidente, necesita cambiar o ajustar su medicacin.  Aumento de ejercicios o actividad sin ajustar los carbohidratos o medicamentos.  Un trastorno nervioso que afecta algunas funciones corporales como ritmo cardaco, presin sangunea y digestin (neuropata [place]).  Una enfermedad en la que los msculos del estmago no funcionan adecuadamente (gastroparesis). Por lo tanto, los medicamentos podran no absorberse de [place].  Incapacidad para reconocer las seales de hipoglucemia (inconsciencia hipoglucmica).  Absorcin de Chief Financial Officer.  El consumo de alcohol.  Embarazo/ciclos menstruales/postparto. Puede deberse al consumo de hormonas.  Ciertos tipos de tumores. Esto es [employer]. SNTOMAS  Sudoracin.  Hambre.  Mareos.  Visin borrosa.  Somnolencia.  Debilitamiento.  Dolor de [place].  Ritmo cardaco rpido.  Temblores.  Nervios. DIAGNSTICO El diagnstico se realiza mediante el control de la glucosa en sangre de una o varias de las siguientes maneras:  Control de glucosa en sangre con tiras de prueba.  Resultados de laboratorio. TRATAMIENTO Si cree que su nivel de glucosa en sangre es bajo:  Controle su nivel de glucosa, si es posible. Si tiene menos de $Remove'70mg'XNcYIFj$ /dl, tome una de las siguientes:  3-4 tabletas de glucosa.   taza de jugo (preferiblemente claro, Elgie Collard).   taza de refresco normal.  1 taza de leche   - 1 tubo de glucosa en gel.  5 6 caramelos duros.  No se sobreestimule por que la glucosa en sangre (azcar) se elevar demasiado.  Espere 15 minutos y vuelva a controlarse la glucosa. Si tiene menos de $Remove'70mg'ytMHsAe$ /dl, (o por debajo de su rango normal), repita el tratamiento.  Ingiera una colacin si falta ms de una hora hasta su prxima comida. A veces, la glucosa en sangre podr ser tan baja que ser incapaz de tratarse usted mismo. Podr ser necesario que alguien le ayude. Podra incluso desmayarse o ser incapaz de tragar. Esto podr requerir una inyeccin de glucagn, que eleva la glucosa en sangre. INSTRUCCIONES PARA EL CUIDADO DOMICILIARIO  Controle la glucosa en sangre segn le haya indicado el mdico.  Utilice los medicamentos tal como se le indic.  Siga el plan de alimentacin. No saltee comidas. Coma siempre a la misma hora.  Si va a beber alcohol, hgalo slo con las comidas.  Controle su nivel de glucosa antes de conducir.  Controle su  nivel de glucosa antes de hacer ejercicio. Si va a hacer ejercicio por ms tiempo o de [employer] a la usual, controle la glucosa con ms frecuencia.  Siempre lleve el tratamiento con usted. Las tabletas de glucosa son las ms fciles de Catering manager.  Tenga siempre  una pulsera de alerta mdica o alguna identificacin que indique que es diabtico. Esto le indicar a las personas que usted tiene diabetes. Si tiene hipoglucemia, tendrn Nature conservation officer idea Customer service manager. SOLICITE ATENCIN MDICA SI:  Tiene problemas para Advertising account executive de glucosa en el rango indicado.  Tiene episodios frecuentes de hipoglucemia.  Siente efectos adversos por los medicamentos prescriptos.  Tiene sntomas de enfermedad que no mejoran en 3  4 das.  Nota cambios o un nuevo problema en la visin . SOLICITE ATENCIN MDICA DE INMEDIATO SI:  Usted es un familiar o amigo de la persona cuya glucosa en sangre est por debajo de $RemoveB'70mg'SWtuPHgZ$ /dl y est acompaada de:  Confusin.  Cambio en el estado mental.  Incapacidad para tragar.  Se desmaya. Document Released: 05/22/2005 Document Revised: 08/14/2011 [facility] Patient Information 2014 [place], [place].

## 2013-03-20 NOTE — Assessment & Plan Note (Signed)
Will recheck creatinine in December. Counseled patient no NSAIDs and blood draws from right arm only. Had tried to refer but no orange card acceptance for nephrology at present.

## 2013-03-20 NOTE — Progress Notes (Signed)
[facility] Garret Reddish, MD Phone: [phone]  Subjective:  Chief complaint-noted Used Velna Hatchet Interpreter  # Hypertension/CKD Stage III BP Readings from Last 3 Encounters:  03/20/13 170/100  02/03/13 166/98  01/30/13 126/84  Home BP monitoring-occasionally checks at pharmacy sometimes SBP <130 sometimes SBP 160-170 if forgets medicines Compliant with medications-yes without side effects Did NOT take this morning as he forgot. States forgets about 2x a month ROS-Denies any chest pain or shortness of breath  # DIABETES Type II Medications taking and tolerating-yes, novolog 70/30 16 units in AM and 11 in PM Blood Sugars per patient-fasting-115-130 typically, has been as high as 170 at times.  2 hour post prandials after lunch ranging from 120-140. Did have 1 at 105 and later felt weak and shaky so took a glucose tablet and felt better-rechecked in 30 minutes and was 110 and had no further problems that evening.  ROS- Denies Polyuria,Polydipsia. Denies  Hypoglycemia symptoms other than 1x in last month. (  Past Medical History Patient Active Problem List   Diagnosis Date Noted  . Coronary Artery Disease 04/24/2012    Priority: High  . Diabetes mellitus, insulin dependent (IDDM), controlled 04/24/2012    Priority: High  . Hypertension, essential 04/24/2012    Priority: Medium  . CKD (chronic kidney disease), stage III 12/27/2012  . Diabetic neuropathy 06/04/2012  . Hyperlipidemia 04/17/2012  . GERD (gastroesophageal reflux disease) 04/15/2012  . Normocytic anemia 04/13/2012   Medications- reviewed and updated Current Outpatient Prescriptions on File Prior to Visit  Medication Sig Dispense Refill  . aspirin EC 81 MG EC tablet Take 1 tablet (81 mg total) by mouth daily.      . insulin NPH-regular (NOVOLIN 70/30) (70-30) 100 UNIT/ML injection Inject 16 units before breakfast and 11 units before dinner      . prasugrel (EFFIENT) 10 MG TABS Take 1 tablet  (10 mg total) by mouth daily.  30 tablet  11  . pravastatin (PRAVACHOL) 40 MG tablet Take 1 tablet (40 mg total) by mouth daily.  30 tablet  11  . carvedilol (COREG) 6.25 MG tablet 1 TABLETS TWICE A DAY      . hydrochlorothiazide (HYDRODIURIL) 25 MG tablet Take 1 tablet (25 mg total) by mouth daily.  30 tablet  11  . lisinopril (PRINIVIL,ZESTRIL) 20 MG tablet Take 1 tablet (20 mg total) by mouth daily.  30 tablet  0   No current facility-administered medications on file prior to visit.    Objective: BP 170/100  Pulse 78  Temp(Src) 98.1 F (36.7 C) (Oral)  Ht $R'5\' 3"'iZ$  (1.6 m)  Wt 165 lb (74.844 kg)  BMI 29.24 kg/m2 Gen: NAD, resting comfortably on table CV: RRR no murmurs rubs or gallops Lungs: CTAB no crackles, wheeze, rhonchi Ext: no edema  Assessment/Plan:

## 2013-03-20 NOTE — Assessment & Plan Note (Signed)
Suspect a1c still less than 8. Slightly poor control but given 1 hypoglycemic episode do not want to titrate up at this time. Counseled extensively on hypoglycemia.

## 2013-03-28 ENCOUNTER — Telehealth: Payer: Self-pay | Admitting: *Deleted

## 2013-03-28 NOTE — Telephone Encounter (Signed)
Patients daughter called requesting samples of Effient. She also inquired about the assistance program that he applied for to get this med for free. He was approved, she said, but they never heard anything else about it. I will route to [employer]. Samples left at front desk for pick up. Daughter aware.

## 2013-04-01 NOTE — Telephone Encounter (Signed)
I contacted the Effient assistance program and the pt was approved for program 06/04/12. The only shipment that has been sent to the pt was the initial 4 month supply. I will fax a refill request into [facility] for an additional 4 month supply and contact the pt's daughter when the medication arrives.

## 2013-04-03 ENCOUNTER — Ambulatory Visit: Payer: No Typology Code available for payment source

## 2013-04-21 ENCOUNTER — Telehealth: Payer: Self-pay | Admitting: *Deleted

## 2013-04-21 NOTE — Telephone Encounter (Signed)
Called patient to let him know that his effient from [facility] patient assistance program were here. Will leave at front desk for pick up. Will pick up 04/23/13 when he has an appointment.

## 2013-04-23 ENCOUNTER — Ambulatory Visit (INDEPENDENT_AMBULATORY_CARE_PROVIDER_SITE_OTHER): Payer: No Typology Code available for payment source | Admitting: Cardiovascular Disease

## 2013-04-23 ENCOUNTER — Encounter: Payer: Self-pay | Admitting: Cardiovascular Disease

## 2013-04-23 VITALS — BP 176/132 | HR 90 | Ht 63.0 in | Wt 164.0 lb

## 2013-04-23 DIAGNOSIS — I2510 Atherosclerotic heart disease of native coronary artery without angina pectoris: Secondary | ICD-10-CM

## 2013-04-23 DIAGNOSIS — I10 Essential (primary) hypertension: Secondary | ICD-10-CM

## 2013-04-23 MED ORDER — LISINOPRIL 40 MG PO TABS: 40.0000 mg | ORAL_TABLET | Freq: Every day | ORAL | Status: DC

## 2013-04-23 MED ORDER — CARVEDILOL 12.5 MG PO TABS: 12.5000 mg | ORAL_TABLET | Freq: Two times a day (BID) | ORAL | Status: DC

## 2013-04-23 NOTE — Patient Instructions (Addendum)
Your physician recommends that you schedule a follow-up appointment in: 4 WEEKS with Richardson Dopp PA-C  Your physician has recommended you make the following change in your medication: INCREASE Lisinopril to $RemoveBefor'40mg'wMXEmmwKOFCg$  take one by mouth daily, INCREASE Carvedilol to 12.5 mg take one by mouth twice a day

## 2013-04-23 NOTE — Progress Notes (Signed)
    HPI:   52 year old gentleman presenting for followup evaluation. The patient is followed for coronary artery disease. About one year ago he presented with unstable angina and underwent PCI of the left circumflex and right coronary arteries. He has moderate residual CAD and medical therapy has been recommended.  He reports compliance with his medications. He has no chest pain, chest pressure, shortness of breath, palpitations, or leg swelling. He's been under a lot of stress and has to go to [place] to deal with some issues in the next few days. He denies headache, visual changes, or other complaints at the present time.  Outpatient Encounter Prescriptions as of 04/23/2013  Medication Sig  . aspirin EC 81 MG EC tablet Take 1 tablet (81 mg total) by mouth daily.  . carvedilol (COREG) 6.25 MG tablet 1 TABLETS TWICE A DAY  . hydrochlorothiazide (HYDRODIURIL) 25 MG tablet Take 1 tablet (25 mg total) by mouth daily.  . insulin NPH-regular (NOVOLIN 70/30) (70-30) 100 UNIT/ML injection Inject 16 units before breakfast and 11 units before dinner  . lisinopril (PRINIVIL,ZESTRIL) 20 MG tablet Take 1 tablet (20 mg total) by mouth daily.  . prasugrel (EFFIENT) 10 MG TABS Take 1 tablet (10 mg total) by mouth daily.  . pravastatin (PRAVACHOL) 40 MG tablet Take 1 tablet (40 mg total) by mouth daily.    No Known Allergies  Past Medical History  Diagnosis Date  . Diabetes mellitus without complication   . Coronary atherosclerosis     a. admx with [place] 11/13 => LHC 04/15/12: pLAD 50%, oD1 75% (small), pCFX 40% followed by mCFX 95%, pRCA 60%, mRCA 90%, EF 55-65%. PCI 04/15/12: [facility] to the [facility] and [facility] to [facility].  . Mixed hyperlipidemia   . Essential hypertension   . Cataract, left eye     s/p laser  . CKD (chronic kidney disease) stage 3, GFR 30-59 ml/min    ROS: Negative except as per HPI  BP 176/132  Pulse 90  Ht $R'5\' 3"'LP$  (1.6 m)  Wt 164 lb (74.39 kg)  BMI 29.06 kg/m2  PHYSICAL EXAM: Pt is  alert and oriented, NAD HEENT: normal Neck: JVP - normal, carotids 2+= without bruits Lungs: CTA bilaterally CV: RRR without murmur or gallop Abd: soft, NT, Positive BS, no hepatomegaly Ext: no C/C/E, distal pulses intact and equal Skin: warm/dry no rash  ASSESSMENT AND PLAN: 1. Coronary artery disease, native vessel. The patient is stable without symptoms of angina. He was given samples of effient today. He reports medication adherence.  2. Malignant hypertension, uncontrolled. Blood pressure is markedly elevated. On recheck it is 160/110. The patient's carvedilol will be doubled to 12.5 mg twice daily and his lisinopril will be doubled to 40 mg daily. He is leaving town in the next day or two for [place]. He will followup with Richardson Dopp on his return in 3-4 weeks for further medication adjustment.  3. Hyperlipidemia. Continue pravastatin.  Sherren Mocha 04/23/2013 5:52 PM

## 2013-05-21 ENCOUNTER — Encounter: Payer: Self-pay | Admitting: Physician Assistant

## 2013-05-21 ENCOUNTER — Ambulatory Visit (INDEPENDENT_AMBULATORY_CARE_PROVIDER_SITE_OTHER): Payer: No Typology Code available for payment source | Admitting: Physician Assistant

## 2013-05-21 VITALS — BP 170/100 | HR 81 | Ht 63.0 in | Wt 158.0 lb

## 2013-05-21 DIAGNOSIS — E785 Hyperlipidemia, unspecified: Secondary | ICD-10-CM

## 2013-05-21 DIAGNOSIS — I2510 Atherosclerotic heart disease of native coronary artery without angina pectoris: Secondary | ICD-10-CM

## 2013-05-21 DIAGNOSIS — R6889 Other general symptoms and signs: Secondary | ICD-10-CM

## 2013-05-21 DIAGNOSIS — I10 Essential (primary) hypertension: Secondary | ICD-10-CM

## 2013-05-21 MED ORDER — CARVEDILOL 12.5 MG PO TABS: 18.7500 mg | ORAL_TABLET | Freq: Two times a day (BID) | ORAL | Status: DC

## 2013-05-21 NOTE — Progress Notes (Signed)
[facility]. [address], [address] [place], [place]  [address] Phone: [phone] Fax:  [phone]  Date:  05/21/2013   ID:  Stephen Lane, DOB 1960/07/30, MRN [id number]  PCP:  Garret Reddish, MD  Cardiologist:  Dr. Sherren Mocha     History of Present Illness: Stephen Lane is a 52 y.o. male with a hx of CAD, DM2, HTH, HL.  He was admitted 03/2012 with [place] and [place] demonstrated high-grade lesions in the mid CFX and mid RCA. He had moderate disease in a small ostial Dx as well as the proximal RCA, proximal CFX and proximal LAD. He underwent placement of a DES to the mid CFX and a DES to mid RCA.    Last seen by Dr. Sherren Mocha in 04/2013.  BP was markedly elevated.  Patient admitted to medication compliance.  His medications were adjusted.  Since then, he traveled to [place].  He is overall doing well.  The patient denies chest pain, shortness of breath, syncope, orthopnea, PND or significant pedal edema.    Recent Labs: 10/09/2012: ALT 16; HDL 35*; LDL (calc) 98  01/21/2013: Creatinine 2.1*; Hemoglobin 11.5*; Potassium 4.1; TSH 0.98    Wt Readings from Last 3 Encounters:  05/21/13 158 lb (71.668 kg)  04/23/13 164 lb (74.39 kg)  03/20/13 165 lb (74.844 kg)     Past Medical History  Diagnosis Date  . Diabetes mellitus without complication   . Coronary atherosclerosis     a. admx with [place] 11/13 => LHC 04/15/12: pLAD 50%, oD1 75% (small), pCFX 40% followed by mCFX 95%, pRCA 60%, mRCA 90%, EF 55-65%. PCI 04/15/12: DES to the [facility] and DES to the [facility].  . Mixed hyperlipidemia   . Essential hypertension   . Cataract, left eye     s/p laser  . CKD (chronic kidney disease) stage 3, GFR 30-59 ml/min     Current Outpatient Prescriptions  Medication Sig Dispense Refill  . aspirin EC 81 MG EC tablet Take 1 tablet (81 mg total) by mouth daily.      . carvedilol (COREG) 12.5 MG tablet Take 1 tablet (12.5 mg total) by mouth 2 (two) times daily with a meal.  60 tablet  11    . hydrochlorothiazide (HYDRODIURIL) 25 MG tablet Take 1 tablet (25 mg total) by mouth daily.  30 tablet  11  . insulin NPH-regular (NOVOLIN 70/30) (70-30) 100 UNIT/ML injection Inject 16 units before breakfast and 11 units before dinner      . lisinopril (PRINIVIL,ZESTRIL) 40 MG tablet Take 1 tablet (40 mg total) by mouth daily.  30 tablet  11  . prasugrel (EFFIENT) 10 MG TABS Take 1 tablet (10 mg total) by mouth daily.  30 tablet  11  . pravastatin (PRAVACHOL) 40 MG tablet Take 1 tablet (40 mg total) by mouth daily.  30 tablet  11   No current facility-administered medications for this visit.    Allergies:   No Known Allergies  Social History:  The patient  reports that he has quit smoking. He has never used smokeless tobacco. He reports that he does not drink alcohol or use illicit drugs.   ROS:  Please see the history of present illness.  He denies TIA symptoms. He does have some HAs.  No melena, hematochezia, hematuria.  All other systems reviewed and negative.   PHYSICAL EXAM: VS:  BP 170/100  Pulse 81  Ht $R'5\' 3"'Ji$  (1.6 m)  Wt 158 lb (71.668 kg)  BMI 28.00  kg/m2 Repeat BP by me:  R 120/80;  L 150/100  Well nourished, well developed, in no acute distress HEENT: normal Neck: no JVD Vascular:  No carotid bruits;  No supraclavicular bruits Cardiac:  normal S1, S2; RRR; no murmur Lungs:  clear to auscultation bilaterally, no wheezing, rhonchi or rales Abd: soft, nontender, no hepatomegaly Ext: no edema Skin: warm and dry Neuro:  CNs 2-12 intact, no focal abnormalities noted  EKG:  NSR, HR, 81, NSSTTW changes, no change from prior tracing     ASSESSMENT AND PLAN:  1. CAD:  No angina.  Continue ASA, Effient, statin.   2. Hypertension:  Uncontrolled.  He has a large BP discrepancy b/t both arms.  No bruits auscultated.  I will obtain carotid US to r/o subclavian stenosis.  Check BP on L arm only for now. Increase Coreg to 18.75 bid. 3. Chronic Kidney Disease:  Check BMET  today. 4. Hyperlipidemia:  Continue statin. 5. Diabetes Mellitus:  Continue f/u with PCP. 6. Disposition:  F/u with me in 3 weeks.   Signed, Richardson Dopp, PA-C  05/21/2013 4:06 PM

## 2013-05-21 NOTE — Patient Instructions (Addendum)
Your physician has requested that you have a carotid duplex BP DISCREPANCY; MAKE SURE TO CHECK VERTEBRAL'S PER SCOTT WEAVER,,PAC. PT WILL NEED TO HAVE AN INTERPRETER WITH HIM FOR HIS TEST. This test is an ultrasound of the carotid arteries in your neck. It looks at blood flow through these arteries that supply the brain with blood. Allow one hour for this exam. There are no restrictions or special instructions.  INCREASE CARVEDILOL TO 1 AND 1/2 TABLETS TWICE DAILY = 18.75 MG TWICE DAILY; REFILL SENT IN TO [facility] ON [address]  PLEASE FOLLOW UP WITH SCOTT WEAVER, PAC IN 3 WEEKS MAKE SURE PT HAS INTERPRETER AT THE VISIT  LAB WORK TODAY; BMET

## 2013-05-22 LAB — BASIC METABOLIC PANEL
Chloride: 101 mEq/L (ref 96–112)
GFR: 36.98 mL/min — ABNORMAL LOW (ref >60.00)
Potassium: 4.3 mEq/L (ref 3.5–5.1)
Sodium: 134 mEq/L — ABNORMAL LOW (ref 135–145)

## 2013-05-22 NOTE — Progress Notes (Signed)
Quick Note:  LMTCB 12/18$RemoveBef'@2'FDBfPEdiCk$ :57pm/pe ______

## 2013-05-23 ENCOUNTER — Telehealth: Payer: Self-pay | Admitting: *Deleted

## 2013-05-23 NOTE — Telephone Encounter (Signed)
lmptcb on daughter Stephen Lane's phone due to language barrier pt does not understand English well.

## 2013-05-30 ENCOUNTER — Encounter: Payer: Self-pay | Admitting: Cardiology

## 2013-05-30 ENCOUNTER — Ambulatory Visit (HOSPITAL_COMMUNITY): Payer: No Typology Code available for payment source | Attending: Cardiology

## 2013-05-30 DIAGNOSIS — R6889 Other general symptoms and signs: Secondary | ICD-10-CM

## 2013-05-30 DIAGNOSIS — I6529 Occlusion and stenosis of unspecified carotid artery: Secondary | ICD-10-CM

## 2013-05-30 DIAGNOSIS — I10 Essential (primary) hypertension: Secondary | ICD-10-CM

## 2013-05-30 DIAGNOSIS — I658 Occlusion and stenosis of other precerebral arteries: Secondary | ICD-10-CM | POA: Insufficient documentation

## 2013-05-30 DIAGNOSIS — I2510 Atherosclerotic heart disease of native coronary artery without angina pectoris: Secondary | ICD-10-CM | POA: Insufficient documentation

## 2013-05-30 DIAGNOSIS — E785 Hyperlipidemia, unspecified: Secondary | ICD-10-CM | POA: Insufficient documentation

## 2013-05-30 DIAGNOSIS — Z87891 Personal history of nicotine dependence: Secondary | ICD-10-CM | POA: Insufficient documentation

## 2013-05-30 DIAGNOSIS — E119 Type 2 diabetes mellitus without complications: Secondary | ICD-10-CM | POA: Insufficient documentation

## 2013-06-03 ENCOUNTER — Encounter: Payer: Self-pay | Admitting: Physician Assistant

## 2013-06-04 ENCOUNTER — Encounter: Payer: Self-pay | Admitting: *Deleted

## 2013-06-04 NOTE — Telephone Encounter (Signed)
lmptcb on the pt's daughter's phone due to language barrier to discuss carotid results. Have been unable to s/w anyone and a letter was sent out 05/30/13 to call for the results. Pt has appt w/SW. PA 06/11/13.

## 2013-06-06 ENCOUNTER — Telehealth: Payer: Self-pay | Admitting: *Deleted

## 2013-06-06 ENCOUNTER — Other Ambulatory Visit: Payer: Self-pay | Admitting: *Deleted

## 2013-06-06 DIAGNOSIS — I10 Essential (primary) hypertension: Secondary | ICD-10-CM

## 2013-06-06 DIAGNOSIS — I2510 Atherosclerotic heart disease of native coronary artery without angina pectoris: Secondary | ICD-10-CM

## 2013-06-06 NOTE — Telephone Encounter (Signed)
s/w pt's daughter [name] who has been made aware of carotid results and 05/21/13 lab results since we have been unable to reach anyone. She does state pt coughs and chokes when he eats. I advised for them to call his PCP for an appt to evaluate, said ok

## 2013-06-11 ENCOUNTER — Ambulatory Visit (INDEPENDENT_AMBULATORY_CARE_PROVIDER_SITE_OTHER): Payer: No Typology Code available for payment source | Admitting: Physician Assistant

## 2013-06-11 ENCOUNTER — Encounter: Payer: Self-pay | Admitting: Physician Assistant

## 2013-06-11 VITALS — BP 120/80 | HR 86 | Ht 63.0 in | Wt 154.0 lb

## 2013-06-11 DIAGNOSIS — I10 Essential (primary) hypertension: Secondary | ICD-10-CM

## 2013-06-11 DIAGNOSIS — I6529 Occlusion and stenosis of unspecified carotid artery: Secondary | ICD-10-CM

## 2013-06-11 DIAGNOSIS — I6523 Occlusion and stenosis of bilateral carotid arteries: Secondary | ICD-10-CM

## 2013-06-11 DIAGNOSIS — N183 Chronic kidney disease, stage 3 unspecified: Secondary | ICD-10-CM

## 2013-06-11 DIAGNOSIS — I2510 Atherosclerotic heart disease of native coronary artery without angina pectoris: Secondary | ICD-10-CM

## 2013-06-11 DIAGNOSIS — E785 Hyperlipidemia, unspecified: Secondary | ICD-10-CM

## 2013-06-11 DIAGNOSIS — I658 Occlusion and stenosis of other precerebral arteries: Secondary | ICD-10-CM

## 2013-06-11 NOTE — Patient Instructions (Signed)
Your physician recommends that you continue on your current medications as directed. Please refer to the Current Medication list given to you today.   PLEASE FOLLOW UP WITH DR. Burt Knack IN April 2015

## 2013-06-11 NOTE — Progress Notes (Signed)
[facility]. [address] [place], [place]  [address] Phone: [phone] Fax:  [phone]  Date:  06/11/2013   ID:  Stephen Lane, DOB 1960-10-27, MRN [id number]  PCP:  Garret Reddish, MD  Cardiologist:  Dr. Sherren Mocha     History of Present Illness: Stephen Lane is a 53 y.o. male with a hx of CAD, DM2, HTH, HL.  He was admitted 03/2012 with [place] and [place] demonstrated high-grade lesions in the mid CFX and mid RCA. He had moderate disease in a small ostial Dx as well as the proximal RCA, proximal CFX and proximal LAD. He underwent placement of a DES to the mid CFX and a DES to mid RCA.    I saw him in f/u for HTN 12/17.  BP remained elevated.  But, I detected a discrepancy b/t his R and L arms.  Carotid US demonstrated 1-39% bilateral ICA stenosis but no evidence of subclavian stenosis.  I adjusted his Coreg for BP control and asked him to f/u today.  He is here with the interpreter. He could not tolerate Coreg 18.75 BID.  He reduced this back to 12.5 BID.  The patient denies chest pain, shortness of breath, syncope, orthopnea, PND or significant pedal edema.   Recent Labs: 10/09/2012: ALT 16; HDL 35*; LDL (calc) 98  01/21/2013: Hemoglobin 11.5*; TSH 0.98  05/21/2013: Creatinine 2.0*; Potassium 4.3    Wt Readings from Last 3 Encounters:  06/11/13 154 lb (69.854 kg)  05/21/13 158 lb (71.668 kg)  04/23/13 164 lb (74.39 kg)     Past Medical History  Diagnosis Date  . Diabetes mellitus without complication   . Coronary atherosclerosis     a. admx with [place] 11/13 => LHC 04/15/12: pLAD 50%, oD1 75% (small), pCFX 40% followed by mCFX 95%, pRCA 60%, mRCA 90%, EF 55-65%. PCI 04/15/12: [facility] to the [facility] and DES to the [facility].  . Mixed hyperlipidemia   . Essential hypertension   . Cataract, left eye     s/p laser  . CKD (chronic kidney disease) stage 3, GFR 30-59 ml/min   . Carotid stenosis     a. Carotid US (05/2013):  1-39% bilateral ICA; brachial waveforms triphasic  bilat; vertebrals with antegrade flow bilat; repeat 1 year    Current Outpatient Prescriptions  Medication Sig Dispense Refill  . aspirin EC 81 MG EC tablet Take 1 tablet (81 mg total) by mouth daily.      . carvedilol (COREG) 12.5 MG tablet Take 1.5 tablets (18.75 mg total) by mouth 2 (two) times daily with a meal.  90 tablet  11  . hydrochlorothiazide (HYDRODIURIL) 25 MG tablet Take 1 tablet (25 mg total) by mouth daily.  30 tablet  11  . insulin NPH-regular (NOVOLIN 70/30) (70-30) 100 UNIT/ML injection Inject 16 units before breakfast and 11 units before dinner      . lisinopril (PRINIVIL,ZESTRIL) 40 MG tablet Take 1 tablet (40 mg total) by mouth daily.  30 tablet  11  . prasugrel (EFFIENT) 10 MG TABS Take 1 tablet (10 mg total) by mouth daily.  30 tablet  11  . pravastatin (PRAVACHOL) 40 MG tablet Take 1 tablet (40 mg total) by mouth daily.  30 tablet  11   No current facility-administered medications for this visit.    Allergies:   No Known Allergies  Social History:  The patient  reports that he has quit smoking. He has never used smokeless tobacco. He reports that he does not  drink alcohol or use illicit drugs.   ROS:  Please see the history of present illness.    All other systems reviewed and negative.   PHYSICAL EXAM: VS:  BP 120/80  Pulse 86  Ht $R'5\' 3"'dv$  (1.6 m)  Wt 154 lb (69.854 kg)  BMI 27.29 kg/m2  Well nourished, well developed, in no acute distress HEENT: normal Neck: no JVD Cardiac:  normal S1, S2; RRR; no murmur Lungs:  clear to auscultation bilaterally, no wheezing, rhonchi or rales Abd: soft, nontender, no hepatomegaly Ext: no edema Skin: warm and dry Neuro:  CNs 2-12 intact, no focal abnormalities noted  EKG:  NSR, HR 86, inf Q waves,   NSSTTW changes, no change from prior tracings.  ASSESSMENT AND PLAN:  1. CAD:  No angina.  Continue ASA, Effient, statin.   2. Hypertension:  Controlled. Continue current Rx. BP in both arms were equal today.  Carotid US  demonstrated no evidence of subclavian stenosis.  BP discrepancy last time was likely a spurious result. 3. Carotid Stenosis:  Mild plaque by recent US.  Will likely plan f/u Carotid US in 12-18 mos.   4. Chronic Kidney Disease:  Stable. 5. Hyperlipidemia:  Continue statin. 6. Diabetes Mellitus:  Continue f/u with PCP. 7. Disposition:  F/u with Dr. Sherren Mocha in 09/2013.   Signed, Richardson Dopp, PA-C  06/11/2013 12:16 PM

## 2013-09-15 ENCOUNTER — Ambulatory Visit (INDEPENDENT_AMBULATORY_CARE_PROVIDER_SITE_OTHER): Payer: Self-pay | Admitting: Cardiovascular Disease

## 2013-09-15 ENCOUNTER — Encounter: Payer: Self-pay | Admitting: Cardiovascular Disease

## 2013-09-15 VITALS — BP 160/80 | HR 93 | Ht 63.0 in | Wt 158.0 lb

## 2013-09-15 DIAGNOSIS — I10 Essential (primary) hypertension: Secondary | ICD-10-CM

## 2013-09-15 DIAGNOSIS — E785 Hyperlipidemia, unspecified: Secondary | ICD-10-CM

## 2013-09-15 DIAGNOSIS — I2510 Atherosclerotic heart disease of native coronary artery without angina pectoris: Secondary | ICD-10-CM

## 2013-09-15 MED ORDER — AMLODIPINE BESYLATE 5 MG PO TABS: 5.0000 mg | ORAL_TABLET | Freq: Every day | ORAL | Status: DC

## 2013-09-15 NOTE — Progress Notes (Signed)
HPI:   Stephen Lane is a 53 y.o. male with a hx of CAD, DM2, HTH, HL. He was admitted 03/2012 with [place] and [place] demonstrated high-grade lesions in the mid CFX and mid RCA. He had moderate disease in a small ostial Dx as well as the proximal RCA, proximal CFX and proximal LAD. He underwent placement of a DES to the mid CFX and a DES to mid RCA. The patient has malignant hypertension which has been difficult to control. At the time of his last visit in January his blood pressure was well controlled. He presents today for followup evaluation. Lipids were last checked in May 2014 with a cholesterol 187, triglycerides 270, HDL 35, and LDL 98.  The patient is doing fairly well. He states that his blood pressure goes up when he goes to the doctor. He denies chest pain or shortness of breath. He is physically active and he was out mowing his yard this morning without exertional symptoms. He's been compliant with his medications. Limited resources are an issue, but he has been able to get all of his medications with the exception of effient. History obtained with help from an interpreter who was present for this evaluation today.   Outpatient Encounter Prescriptions as of 09/15/2013  Medication Sig  . aspirin EC 81 MG EC tablet Take 1 tablet (81 mg total) by mouth daily.  . carvedilol (COREG) 12.5 MG tablet Take 12.5 mg by mouth 2 (two) times daily with a meal.  . hydrochlorothiazide (HYDRODIURIL) 25 MG tablet Take 1 tablet (25 mg total) by mouth daily.  . insulin NPH-regular (NOVOLIN 70/30) (70-30) 100 UNIT/ML injection Inject 16 units before breakfast and 11 units before dinner  . lisinopril (PRINIVIL,ZESTRIL) 40 MG tablet Take 1 tablet (40 mg total) by mouth daily.  . prasugrel (EFFIENT) 10 MG TABS Take 1 tablet (10 mg total) by mouth daily.  . pravastatin (PRAVACHOL) 40 MG tablet Take 1 tablet (40 mg total) by mouth daily.    No Known Allergies  Past Medical History  Diagnosis Date  .  Diabetes mellitus without complication   . Coronary atherosclerosis     a. admx with [place] 11/13 => LHC 04/15/12: pLAD 50%, oD1 75% (small), pCFX 40% followed by mCFX 95%, pRCA 60%, mRCA 90%, EF 55-65%. PCI 04/15/12: DES to the [facility] and DES to the [facility].  . Mixed hyperlipidemia   . Essential hypertension   . Cataract, left eye     s/p laser  . CKD (chronic kidney disease) stage 3, GFR 30-59 ml/min   . Carotid stenosis     a. Carotid US (05/2013):  1-39% bilateral ICA; brachial waveforms triphasic bilat; vertebrals with antegrade flow bilat; repeat 1 year    ROS: Negative except as per HPI  BP 160/80  Pulse 93  Ht $R'5\' 3"'CI$  (1.6 m)  Wt 158 lb (71.668 kg)  BMI 28.00 kg/m2  PHYSICAL EXAM: Pt is alert and oriented, NAD HEENT: normal Neck: JVP - normal, carotids 2+= without bruits Lungs: CTA bilaterally CV: RRR without murmur or gallop Abd: soft, NT, Positive BS, no hepatomegaly Ext: no C/C/E, distal pulses intact and equal Skin: warm/dry no rash  EKG:  Normal sinus rhythm 93 beats per minute. Voltage criteria for LVH, inferior MI age undetermined with 60 laterality.  ASSESSMENT AND PLAN: 1. CAD, native vessel. The patient is stable without recurrent symptoms of angina. He is greater than 12 months out from PCI and can stop effient. We will keep  him on aspirin indefinitely.  2. Malignant hypertension. I rechecked the patient's blood pressure and obtained a reading of 160/100 on the right arm. Recommend add amlodipine 5 mg daily. He was unable to tolerate carvedilol at higher doses. He will continue his current doses of carvedilol, hydrochlorothiazide, and lisinopril. He appears to be compliant with his medications.  3. Hyperlipidemia. Continue pravastatin 40 mg.  For followup I would like him to see Richardson Dopp back in one month.  Sherren Mocha 09/15/2013 12:46 PM

## 2013-09-15 NOTE — Patient Instructions (Signed)
Your physician has recommended you make the following change in your medication: STOP Effient, START Amlodipine $RemoveBeforeDE'5mg'wSOXbNrFHjuTwQI$  take one by mouth daily  Your physician recommends that you schedule a follow-up appointment in: 1 MONTH with Richardson Dopp PA-C

## 2013-10-15 ENCOUNTER — Ambulatory Visit: Payer: Self-pay | Admitting: Physician Assistant

## 2013-10-17 ENCOUNTER — Encounter: Payer: Self-pay | Admitting: Physician Assistant

## 2014-03-20 NOTE — Telephone Encounter (Signed)
Nothing was typed/tmj 

## 2014-05-14 ENCOUNTER — Encounter (HOSPITAL_COMMUNITY): Payer: Self-pay | Admitting: Cardiology

## 2014-08-24 ENCOUNTER — Telehealth: Payer: Self-pay | Admitting: *Deleted

## 2014-08-24 NOTE — Telephone Encounter (Signed)
LMOVM with spanish interpreter [phone]) for pt to call us back to schedule an appt with pcp for her diabetes. Brenen Beigel Kennon Holter, CMA

## 2015-01-04 DIAGNOSIS — J189 Pneumonia, unspecified organism: Secondary | ICD-10-CM

## 2015-01-04 HISTORY — DX: Pneumonia, unspecified organism: J18.9

## 2015-01-25 ENCOUNTER — Emergency Department (HOSPITAL_COMMUNITY): Payer: Medicaid Other

## 2015-01-25 ENCOUNTER — Encounter (HOSPITAL_COMMUNITY): Payer: Self-pay

## 2015-01-25 DIAGNOSIS — K219 Gastro-esophageal reflux disease without esophagitis: Secondary | ICD-10-CM | POA: Diagnosis present

## 2015-01-25 DIAGNOSIS — Z955 Presence of coronary angioplasty implant and graft: Secondary | ICD-10-CM

## 2015-01-25 DIAGNOSIS — N2581 Secondary hyperparathyroidism of renal origin: Secondary | ICD-10-CM | POA: Diagnosis present

## 2015-01-25 DIAGNOSIS — N179 Acute kidney failure, unspecified: Principal | ICD-10-CM | POA: Diagnosis present

## 2015-01-25 DIAGNOSIS — Z87891 Personal history of nicotine dependence: Secondary | ICD-10-CM

## 2015-01-25 DIAGNOSIS — Z7982 Long term (current) use of aspirin: Secondary | ICD-10-CM

## 2015-01-25 DIAGNOSIS — E1122 Type 2 diabetes mellitus with diabetic chronic kidney disease: Secondary | ICD-10-CM | POA: Diagnosis present

## 2015-01-25 DIAGNOSIS — H547 Unspecified visual loss: Secondary | ICD-10-CM | POA: Diagnosis present

## 2015-01-25 DIAGNOSIS — N186 End stage renal disease: Secondary | ICD-10-CM | POA: Diagnosis present

## 2015-01-25 DIAGNOSIS — I2510 Atherosclerotic heart disease of native coronary artery without angina pectoris: Secondary | ICD-10-CM | POA: Diagnosis present

## 2015-01-25 DIAGNOSIS — I120 Hypertensive chronic kidney disease with stage 5 chronic kidney disease or end stage renal disease: Secondary | ICD-10-CM | POA: Diagnosis present

## 2015-01-25 DIAGNOSIS — E782 Mixed hyperlipidemia: Secondary | ICD-10-CM | POA: Diagnosis present

## 2015-01-25 DIAGNOSIS — M898X9 Other specified disorders of bone, unspecified site: Secondary | ICD-10-CM | POA: Diagnosis present

## 2015-01-25 DIAGNOSIS — E1140 Type 2 diabetes mellitus with diabetic neuropathy, unspecified: Secondary | ICD-10-CM | POA: Diagnosis present

## 2015-01-25 DIAGNOSIS — D631 Anemia in chronic kidney disease: Secondary | ICD-10-CM | POA: Diagnosis present

## 2015-01-25 DIAGNOSIS — I5031 Acute diastolic (congestive) heart failure: Secondary | ICD-10-CM | POA: Diagnosis present

## 2015-01-25 DIAGNOSIS — E1165 Type 2 diabetes mellitus with hyperglycemia: Secondary | ICD-10-CM | POA: Diagnosis present

## 2015-01-25 DIAGNOSIS — E871 Hypo-osmolality and hyponatremia: Secondary | ICD-10-CM | POA: Diagnosis not present

## 2015-01-25 DIAGNOSIS — Z79899 Other long term (current) drug therapy: Secondary | ICD-10-CM

## 2015-01-25 LAB — CBC
HEMATOCRIT: 25.1 % — AB (ref 39.0–52.0)
HEMOGLOBIN: 8.6 g/dL — AB (ref 13.0–17.0)
MCH: 30.3 pg (ref 26.0–34.0)
MCHC: 34.3 g/dL (ref 30.0–36.0)
MCV: 88.4 fL (ref 78.0–100.0)
Platelets: 305 10*3/uL (ref 150–400)
RBC: 2.84 MIL/uL — ABNORMAL LOW (ref 4.22–5.81)
RDW: 14.1 % (ref 11.5–15.5)
WBC: 8.1 10*3/uL (ref 4.0–10.5)

## 2015-01-25 LAB — BASIC METABOLIC PANEL
ANION GAP: 14 (ref 5–15)
BUN: 63 mg/dL — AB (ref 6–20)
CHLORIDE: 100 mmol/L — AB (ref 101–111)
CO2: 22 mmol/L (ref 22–32)
Calcium: 6.6 mg/dL — ABNORMAL LOW (ref 8.9–10.3)
Creatinine, Ser: 7.92 mg/dL — ABNORMAL HIGH (ref 0.61–1.24)
GFR calc Af Amer: 8 mL/min — ABNORMAL LOW (ref >60)
GFR calc non Af Amer: 7 mL/min — ABNORMAL LOW (ref >60)
GLUCOSE: 129 mg/dL — AB (ref 65–99)
POTASSIUM: 4.2 mmol/L (ref 3.5–5.1)
Sodium: 136 mmol/L (ref 135–145)

## 2015-01-25 LAB — I-STAT TROPONIN, ED: Troponin i, poc: 0.02 ng/mL (ref 0.00–0.08)

## 2015-01-25 MED ORDER — ALBUTEROL SULFATE (2.5 MG/3ML) 0.083% IN NEBU
INHALATION_SOLUTION | RESPIRATORY_TRACT | Status: AC
  Filled 2015-01-25: qty 6

## 2015-01-25 MED ORDER — ALBUTEROL SULFATE (2.5 MG/3ML) 0.083% IN NEBU
5.0000 mg | INHALATION_SOLUTION | Freq: Once | RESPIRATORY_TRACT | Status: AC
  Administered 2015-01-25: 5 mg via RESPIRATORY_TRACT

## 2015-01-25 NOTE — ED Notes (Signed)
Pt started having trouble breathing two weeks ago slowly progressed. Pt has audible wheezes sitting in triage. Denies any cp but has been SOB.

## 2015-01-26 ENCOUNTER — Inpatient Hospital Stay (HOSPITAL_COMMUNITY)
Admission: EM | Admit: 2015-01-26 | Discharge: 2015-02-04 | DRG: 673 | Disposition: A | Discharge status: Home / Self Care | Payer: Medicaid Other | Attending: Family Medicine | Admitting: Family Medicine

## 2015-01-26 ENCOUNTER — Encounter (HOSPITAL_COMMUNITY): Payer: Self-pay | Admitting: General Practice

## 2015-01-26 ENCOUNTER — Inpatient Hospital Stay (HOSPITAL_COMMUNITY): Payer: Medicaid Other

## 2015-01-26 DIAGNOSIS — H547 Unspecified visual loss: Secondary | ICD-10-CM | POA: Diagnosis present

## 2015-01-26 DIAGNOSIS — Z419 Encounter for procedure for purposes other than remedying health state, unspecified: Secondary | ICD-10-CM

## 2015-01-26 DIAGNOSIS — R918 Other nonspecific abnormal finding of lung field: Secondary | ICD-10-CM

## 2015-01-26 DIAGNOSIS — D631 Anemia in chronic kidney disease: Secondary | ICD-10-CM | POA: Diagnosis present

## 2015-01-26 DIAGNOSIS — Z992 Dependence on renal dialysis: Secondary | ICD-10-CM

## 2015-01-26 DIAGNOSIS — E871 Hypo-osmolality and hyponatremia: Secondary | ICD-10-CM | POA: Diagnosis not present

## 2015-01-26 DIAGNOSIS — R0600 Dyspnea, unspecified: Secondary | ICD-10-CM

## 2015-01-26 DIAGNOSIS — N189 Chronic kidney disease, unspecified: Secondary | ICD-10-CM

## 2015-01-26 DIAGNOSIS — E1165 Type 2 diabetes mellitus with hyperglycemia: Secondary | ICD-10-CM | POA: Diagnosis present

## 2015-01-26 DIAGNOSIS — N179 Acute kidney failure, unspecified: Secondary | ICD-10-CM

## 2015-01-26 DIAGNOSIS — M898X9 Other specified disorders of bone, unspecified site: Secondary | ICD-10-CM | POA: Diagnosis present

## 2015-01-26 DIAGNOSIS — K219 Gastro-esophageal reflux disease without esophagitis: Secondary | ICD-10-CM | POA: Diagnosis present

## 2015-01-26 DIAGNOSIS — Z95828 Presence of other vascular implants and grafts: Secondary | ICD-10-CM

## 2015-01-26 DIAGNOSIS — I5031 Acute diastolic (congestive) heart failure: Secondary | ICD-10-CM | POA: Diagnosis not present

## 2015-01-26 DIAGNOSIS — N185 Chronic kidney disease, stage 5: Secondary | ICD-10-CM | POA: Diagnosis present

## 2015-01-26 DIAGNOSIS — E1122 Type 2 diabetes mellitus with diabetic chronic kidney disease: Secondary | ICD-10-CM | POA: Diagnosis not present

## 2015-01-26 DIAGNOSIS — Z955 Presence of coronary angioplasty implant and graft: Secondary | ICD-10-CM | POA: Diagnosis not present

## 2015-01-26 DIAGNOSIS — R0602 Shortness of breath: Secondary | ICD-10-CM | POA: Diagnosis present

## 2015-01-26 DIAGNOSIS — I10 Essential (primary) hypertension: Secondary | ICD-10-CM | POA: Diagnosis present

## 2015-01-26 DIAGNOSIS — N186 End stage renal disease: Secondary | ICD-10-CM | POA: Diagnosis present

## 2015-01-26 DIAGNOSIS — I2510 Atherosclerotic heart disease of native coronary artery without angina pectoris: Secondary | ICD-10-CM | POA: Diagnosis present

## 2015-01-26 DIAGNOSIS — Z87891 Personal history of nicotine dependence: Secondary | ICD-10-CM | POA: Diagnosis not present

## 2015-01-26 DIAGNOSIS — D649 Anemia, unspecified: Secondary | ICD-10-CM | POA: Diagnosis present

## 2015-01-26 DIAGNOSIS — Z7982 Long term (current) use of aspirin: Secondary | ICD-10-CM | POA: Diagnosis not present

## 2015-01-26 DIAGNOSIS — Z79899 Other long term (current) drug therapy: Secondary | ICD-10-CM | POA: Diagnosis not present

## 2015-01-26 DIAGNOSIS — J189 Pneumonia, unspecified organism: Secondary | ICD-10-CM

## 2015-01-26 DIAGNOSIS — E785 Hyperlipidemia, unspecified: Secondary | ICD-10-CM | POA: Diagnosis present

## 2015-01-26 DIAGNOSIS — N2581 Secondary hyperparathyroidism of renal origin: Secondary | ICD-10-CM | POA: Diagnosis present

## 2015-01-26 DIAGNOSIS — I120 Hypertensive chronic kidney disease with stage 5 chronic kidney disease or end stage renal disease: Secondary | ICD-10-CM | POA: Diagnosis not present

## 2015-01-26 DIAGNOSIS — E782 Mixed hyperlipidemia: Secondary | ICD-10-CM | POA: Diagnosis present

## 2015-01-26 DIAGNOSIS — I5030 Unspecified diastolic (congestive) heart failure: Secondary | ICD-10-CM

## 2015-01-26 DIAGNOSIS — E1140 Type 2 diabetes mellitus with diabetic neuropathy, unspecified: Secondary | ICD-10-CM | POA: Diagnosis present

## 2015-01-26 HISTORY — DX: Other chronic pain: G89.29

## 2015-01-26 HISTORY — DX: Dorsalgia, unspecified: M54.9

## 2015-01-26 HISTORY — DX: Type 2 diabetes mellitus without complications: E11.9

## 2015-01-26 HISTORY — DX: Headache, unspecified: R51.9

## 2015-01-26 HISTORY — DX: Headache: R51

## 2015-01-26 LAB — CBC
HCT: 22.2 % — ABNORMAL LOW (ref 39.0–52.0)
Hemoglobin: 7.5 g/dL — ABNORMAL LOW (ref 13.0–17.0)
MCH: 29.8 pg (ref 26.0–34.0)
MCHC: 33.8 g/dL (ref 30.0–36.0)
MCV: 88.1 fL (ref 78.0–100.0)
PLATELETS: 260 10*3/uL (ref 150–400)
RBC: 2.52 MIL/uL — ABNORMAL LOW (ref 4.22–5.81)
RDW: 14.2 % (ref 11.5–15.5)
WBC: 7 10*3/uL (ref 4.0–10.5)

## 2015-01-26 LAB — COMPREHENSIVE METABOLIC PANEL
ALT: 15 U/L — ABNORMAL LOW (ref 17–63)
ANION GAP: 11 (ref 5–15)
AST: 12 U/L — ABNORMAL LOW (ref 15–41)
Albumin: 2.4 g/dL — ABNORMAL LOW (ref 3.5–5.0)
Alkaline Phosphatase: 155 U/L — ABNORMAL HIGH (ref 38–126)
BUN: 60 mg/dL — ABNORMAL HIGH (ref 6–20)
CALCIUM: 6.3 mg/dL — AB (ref 8.9–10.3)
CHLORIDE: 102 mmol/L (ref 101–111)
CO2: 24 mmol/L (ref 22–32)
Creatinine, Ser: 7.85 mg/dL — ABNORMAL HIGH (ref 0.61–1.24)
GFR, EST AFRICAN AMERICAN: 8 mL/min — AB (ref >60)
GFR, EST NON AFRICAN AMERICAN: 7 mL/min — AB (ref >60)
Glucose, Bld: 96 mg/dL (ref 65–99)
Potassium: 4.2 mmol/L (ref 3.5–5.1)
SODIUM: 137 mmol/L (ref 135–145)
Total Bilirubin: 0.5 mg/dL (ref 0.3–1.2)
Total Protein: 6 g/dL — ABNORMAL LOW (ref 6.5–8.1)

## 2015-01-26 LAB — IRON AND TIBC
IRON: 14 ug/dL — AB (ref 45–182)
Saturation Ratios: 6 % — ABNORMAL LOW (ref 17.9–39.5)
TIBC: 241 ug/dL — AB (ref 250–450)
UIBC: 227 ug/dL

## 2015-01-26 LAB — PHOSPHORUS: Phosphorus: 6.6 mg/dL — ABNORMAL HIGH (ref 2.5–4.6)

## 2015-01-26 LAB — FERRITIN: Ferritin: 194 ng/mL (ref 24–336)

## 2015-01-26 LAB — GLUCOSE, CAPILLARY
Glucose-Capillary: 87 mg/dL (ref 65–99)
Glucose-Capillary: 88 mg/dL (ref 65–99)

## 2015-01-26 LAB — TROPONIN I: Troponin I: 0.03 ng/mL (ref <0.031)

## 2015-01-26 LAB — BRAIN NATRIURETIC PEPTIDE: B Natriuretic Peptide: 982.6 pg/mL — ABNORMAL HIGH (ref 0.0–100.0)

## 2015-01-26 LAB — LIPID PANEL
CHOLESTEROL: 178 mg/dL (ref 0–200)
HDL: 34 mg/dL — AB (ref >40)
LDL CALC: 124 mg/dL — AB (ref 0–99)
TRIGLYCERIDES: 100 mg/dL (ref <150)
Total CHOL/HDL Ratio: 5.2 RATIO
VLDL: 20 mg/dL (ref 0–40)

## 2015-01-26 LAB — CBG MONITORING, ED
GLUCOSE-CAPILLARY: 106 mg/dL — AB (ref 65–99)
Glucose-Capillary: 115 mg/dL — ABNORMAL HIGH (ref 65–99)

## 2015-01-26 MED ORDER — INSULIN ASPART 100 UNIT/ML ~~LOC~~ SOLN
0.0000 [IU] | Freq: Three times a day (TID) | SUBCUTANEOUS | Status: DC
  Administered 2015-01-28 – 2015-02-01 (×5): 1 [IU] via SUBCUTANEOUS
  Administered 2015-02-02 (×2): 2 [IU] via SUBCUTANEOUS

## 2015-01-26 MED ORDER — DEXTROSE 5 % IV SOLN
1.0000 g | Freq: Once | INTRAVENOUS | Status: AC
  Administered 2015-01-26: 1 g via INTRAVENOUS
  Filled 2015-01-26: qty 10

## 2015-01-26 MED ORDER — INSULIN ASPART 100 UNIT/ML ~~LOC~~ SOLN: 0.0000 [IU] | Freq: Every day | SUBCUTANEOUS | Status: DC

## 2015-01-26 MED ORDER — ATORVASTATIN CALCIUM 40 MG PO TABS
40.0000 mg | ORAL_TABLET | Freq: Every day | ORAL | Status: DC
  Administered 2015-01-26 – 2015-02-03 (×9): 40 mg via ORAL
  Filled 2015-01-26 (×10): qty 1

## 2015-01-26 MED ORDER — ONDANSETRON HCL 4 MG PO TABS: 4.0000 mg | ORAL_TABLET | Freq: Four times a day (QID) | ORAL | Status: DC | PRN

## 2015-01-26 MED ORDER — DEXTROSE 5 % IV SOLN
500.0000 mg | Freq: Once | INTRAVENOUS | Status: AC
  Administered 2015-01-26: 500 mg via INTRAVENOUS
  Filled 2015-01-26: qty 500

## 2015-01-26 MED ORDER — HYDROCHLOROTHIAZIDE 25 MG PO TABS
25.0000 mg | ORAL_TABLET | Freq: Every day | ORAL | Status: DC
  Administered 2015-01-26: 25 mg via ORAL
  Filled 2015-01-26: qty 1

## 2015-01-26 MED ORDER — AMLODIPINE BESYLATE 5 MG PO TABS
5.0000 mg | ORAL_TABLET | Freq: Every day | ORAL | Status: DC
  Administered 2015-01-26: 5 mg via ORAL
  Filled 2015-01-26: qty 1

## 2015-01-26 MED ORDER — FUROSEMIDE 10 MG/ML IJ SOLN
120.0000 mg | Freq: Three times a day (TID) | INTRAVENOUS | Status: DC
  Administered 2015-01-26 – 2015-01-30 (×10): 120 mg via INTRAVENOUS
  Filled 2015-01-26 (×14): qty 12

## 2015-01-26 MED ORDER — ACETAMINOPHEN 650 MG RE SUPP
650.0000 mg | Freq: Four times a day (QID) | RECTAL | Status: DC | PRN
Start: 2015-01-26 — End: 2015-02-04

## 2015-01-26 MED ORDER — HYDRALAZINE HCL 20 MG/ML IJ SOLN
10.0000 mg | INTRAMUSCULAR | Status: DC | PRN
Start: 2015-01-26 — End: 2015-02-04

## 2015-01-26 MED ORDER — AZITHROMYCIN 500 MG IV SOLR
250.0000 mg | Freq: Once | INTRAVENOUS | Status: AC
  Administered 2015-01-27: 250 mg via INTRAVENOUS
  Filled 2015-01-26: qty 250

## 2015-01-26 MED ORDER — SODIUM CHLORIDE 0.9 % IJ SOLN
3.0000 mL | Freq: Two times a day (BID) | INTRAMUSCULAR | Status: DC
  Administered 2015-01-26 – 2015-02-03 (×15): 3 mL via INTRAVENOUS
  Filled 2015-01-26: qty 3

## 2015-01-26 MED ORDER — ACETAMINOPHEN 325 MG PO TABS: 650.0000 mg | ORAL_TABLET | Freq: Four times a day (QID) | ORAL | Status: DC | PRN

## 2015-01-26 MED ORDER — AMLODIPINE BESYLATE 5 MG PO TABS
5.0000 mg | ORAL_TABLET | Freq: Two times a day (BID) | ORAL | Status: DC
  Administered 2015-01-26 – 2015-02-04 (×15): 5 mg via ORAL
  Filled 2015-01-26 (×16): qty 1

## 2015-01-26 MED ORDER — SODIUM CHLORIDE 0.9 % IV SOLN
INTRAVENOUS | Status: DC
  Administered 2015-01-26: 07:00:00 via INTRAVENOUS

## 2015-01-26 MED ORDER — IPRATROPIUM-ALBUTEROL 0.5-2.5 (3) MG/3ML IN SOLN
3.0000 mL | Freq: Once | RESPIRATORY_TRACT | Status: AC
Start: 2015-01-26 — End: 2015-01-26
  Administered 2015-01-26: 3 mL via RESPIRATORY_TRACT
  Filled 2015-01-26: qty 3

## 2015-01-26 MED ORDER — FUROSEMIDE 10 MG/ML IJ SOLN
40.0000 mg | Freq: Four times a day (QID) | INTRAMUSCULAR | Status: DC
  Administered 2015-01-26: 40 mg via INTRAVENOUS
  Filled 2015-01-26 (×2): qty 4

## 2015-01-26 MED ORDER — HEPARIN SODIUM (PORCINE) 5000 UNIT/ML IJ SOLN
5000.0000 [IU] | Freq: Three times a day (TID) | INTRAMUSCULAR | Status: DC
  Administered 2015-01-26 – 2015-02-04 (×24): 5000 [IU] via SUBCUTANEOUS
  Filled 2015-01-26 (×20): qty 1

## 2015-01-26 MED ORDER — ONDANSETRON HCL 4 MG/2ML IJ SOLN: 4.0000 mg | Freq: Four times a day (QID) | INTRAMUSCULAR | Status: DC | PRN

## 2015-01-26 NOTE — Consult Note (Signed)
Reason for Consult: Acute on chronic kidney injury Referring Physician: Dr. Tawanna Sat  Stephen Lane is an 54 y.o. male.   HPI: Stephen Lane is a 54 year old male with DM2 [A1c 7.9, 2014], HTN, HLD, CAD s/p DES x 2 [2013], CKD Stage 3 who presented to the ED with 2-week history of intermittent dyspnea associated with non-productive cough, sore throat, nausea, and poor oral intake. CXR on admission was notable for multifocal opacities thought to be suggestive of CAP, so he was started on IV antibiotics and fluids. On admission, creatinine was 7.9, elevated from baseline 2 back in 2014, and BUN was 60. Weight 164 lbs, up from 158 lbs back in 2015. He reports having minimal urine output over these last two weeks, and his family reports he has had decreased energy over months prior to this recent episode but no changes in his mental status or weight loss. He does not have a regular doctor nor takes any medications. He has been in the [place] since 1986 though returned back there 2014-2015 but did not have any medical care there either. He works Architect jobs intermittently and lives at home with his wife and daughters. He used to spoke 3-4 cigars/day x 10 years and drinks on the weekends but denies any history of illicit drug use.   PMH:   Past Medical History  Diagnosis Date  . Diabetes mellitus without complication   . Coronary atherosclerosis     a. admx with [place] 11/13 => LHC 04/15/12: pLAD 50%, oD1 75% (small), pCFX 40% followed by mCFX 95%, pRCA 60%, mRCA 90%, EF 55-65%. PCI 04/15/12: DES to the [facility] and DES to the [facility].  . Mixed hyperlipidemia   . Essential hypertension   . Cataract, left eye     s/p laser  . CKD (chronic kidney disease) stage 3, GFR 30-59 ml/min   . Carotid stenosis     a. Carotid US (05/2013):  1-39% bilateral ICA; brachial waveforms triphasic bilat; vertebrals with antegrade flow bilat; repeat 1 year    PSH:   Past Surgical History  Procedure Laterality Date  .  Eye surgery      "blood behind eyeball" 2007   . Left heart catheterization with coronary angiogram N/A 04/15/2012    Procedure: LEFT HEART CATHETERIZATION WITH CORONARY ANGIOGRAM;  Surgeon: Larey Dresser, MD;  Location: [facility] CATH LAB;  Service: Cardiovascular;  Laterality: N/A;  . Percutaneous coronary stent intervention (pci-s) N/A 04/15/2012    Procedure: PERCUTANEOUS CORONARY STENT INTERVENTION (PCI-S);  Surgeon: Sherren Mocha, MD;  Location: [facility] CATH LAB;  Service: Cardiovascular;  Laterality: N/A;    Allergies: No Known Allergies  Medications:   Prior to Admission medications   Medication Sig Start Date End Date Taking? Authorizing Provider  amLODipine (NORVASC) 5 MG tablet Take 1 tablet (5 mg total) by mouth daily. Patient not taking: Reported on 01/25/2015 09/15/13   Sherren Mocha, MD  aspirin EC 81 MG EC tablet Take 1 tablet (81 mg total) by mouth daily. Patient not taking: Reported on 01/25/2015 04/17/12   Orson Eva, MD  hydrochlorothiazide (HYDRODIURIL) 25 MG tablet Take 1 tablet (25 mg total) by mouth daily. Patient not taking: Reported on 01/25/2015 12/27/12   Marin Olp, MD  lisinopril (PRINIVIL,ZESTRIL) 40 MG tablet Take 1 tablet (40 mg total) by mouth daily. Patient not taking: Reported on 01/25/2015 04/23/13   Sherren Mocha, MD  pravastatin (PRAVACHOL) 40 MG tablet Take 1 tablet (40 mg total) by mouth daily. Patient not  taking: Reported on 01/25/2015 04/17/12   Orson Eva, MD    Discontinued Meds:   Medications Discontinued During This Encounter  Medication Reason  . insulin NPH-regular (NOVOLIN 70/30) (70-30) 100 UNIT/ML injection Inpatient Standard  . carvedilol (COREG) 12.5 MG tablet Inpatient Standard  . 0.9 %  sodium chloride infusion     Social History:  reports that he has quit smoking. He has never used smokeless tobacco. He reports that he does not drink alcohol or use illicit drugs.  Family History:   Family History  Problem Relation Age of Onset  .  Hypertension Mother     died 58 years old unknown reasons  . Other Father     died when Matheson was 110 years old from "infection"  . Diabetes Brother   . Hypertension Sister    Review of systems:   CREAT  Date/Time Value Ref Range Status  12/27/2012 09:33 AM 1.87* 0.50 - 1.35 mg/dL Final  10/09/2012 09:57 AM 1.57* 0.50 - 1.35 mg/dL Final   CREATININE, SER  Date/Time Value Ref Range Status  01/26/2015 09:16 AM 7.85* 0.61 - 1.24 mg/dL Final  01/25/2015 10:36 PM 7.92* 0.61 - 1.24 mg/dL Final  05/21/2013 04:46 PM 2.0* 0.4 - 1.5 mg/dL Final  01/21/2013 03:09 PM 2.1* 0.4 - 1.5 mg/dL Final  12/13/2012 09:39 AM 1.7* 0.4 - 1.5 mg/dL Final  11/27/2012 04:12 PM 2.0* 0.4 - 1.5 mg/dL Final  05/21/2012 09:36 AM 1.5 0.4 - 1.5 mg/dL Final  04/30/2012 09:21 AM 1.5 0.4 - 1.5 mg/dL Final  04/26/2012 08:33 AM 1.4 0.4 - 1.5 mg/dL Final  04/24/2012 10:22 AM 1.8* 0.4 - 1.5 mg/dL Final  04/17/2012 05:25 AM 1.39* 0.50 - 1.35 mg/dL Final  04/16/2012 05:42 AM 1.19 0.50 - 1.35 mg/dL Final  04/15/2012 05:05 AM 1.20 0.50 - 1.35 mg/dL Final  04/14/2012 11:29 PM 1.26 0.50 - 1.35 mg/dL Final  04/14/2012 07:42 AM 1.30 0.50 - 1.35 mg/dL Final  04/13/2012 04:20 PM 1.28 0.50 - 1.35 mg/dL Final  01/13/2009 03:17 PM 1.4 0.4 - 1.5 mg/dL Final    Recent Labs Lab 01/25/15 2236 01/26/15 0916  NA 136 137  K 4.2 4.2  CL 100* 102  CO2 22 24  GLUCOSE 129* 96  BUN 63* 60*  CREATININE 7.92* 7.85*  CALCIUM 6.6* 6.3*    Recent Labs Lab 01/25/15 2236 01/26/15 0916  WBC 8.1 7.0  HGB 8.6* 7.5*  HCT 25.1* 22.2*  MCV 88.4 88.1  PLT 305 260   Liver Function Tests:  Recent Labs Lab 01/26/15 0916  AST 12*  ALT 15*  ALKPHOS 155*  BILITOT 0.5  PROT 6.0*  ALBUMIN 2.4*   Cardiac Enzymes:  Recent Labs Lab 01/26/15 0916  TROPONINI 0.03   Iron Studies:  Recent Labs  01/26/15 0916  IRON 14*  TIBC 241*  FERRITIN 194    Results for orders placed or performed during the hospital encounter of  01/26/15 (from the past 48 hour(s))  Basic metabolic panel     Status: Abnormal   Collection Time: 01/25/15 10:36 PM  Result Value Ref Range   Sodium 136 135 - 145 mmol/L   Potassium 4.2 3.5 - 5.1 mmol/L   Chloride 100 (L) 101 - 111 mmol/L   CO2 22 22 - 32 mmol/L   Glucose, Bld 129 (H) 65 - 99 mg/dL   BUN 63 (H) 6 - 20 mg/dL   Creatinine, Ser 7.92 (H) 0.61 - 1.24 mg/dL   Calcium 6.6 (L) 8.9 - 10.3  mg/dL   GFR calc non Af Amer 7 (L) >60 mL/min   GFR calc Af Amer 8 (L) >60 mL/min    Comment: (NOTE) The eGFR has been calculated using the CKD EPI equation. This calculation has not been validated in all clinical situations. eGFR's persistently <60 mL/min signify possible Chronic Kidney Disease.    Anion gap 14 5 - 15  CBC     Status: Abnormal   Collection Time: 01/25/15 10:36 PM  Result Value Ref Range   WBC 8.1 4.0 - 10.5 K/uL   RBC 2.84 (L) 4.22 - 5.81 MIL/uL   Hemoglobin 8.6 (L) 13.0 - 17.0 g/dL   HCT 25.1 (L) 39.0 - 52.0 %   MCV 88.4 78.0 - 100.0 fL   MCH 30.3 26.0 - 34.0 pg   MCHC 34.3 30.0 - 36.0 g/dL   RDW 14.1 11.5 - 15.5 %   Platelets 305 150 - 400 K/uL  I-stat troponin, ED     Status: None   Collection Time: 01/25/15 11:02 PM  Result Value Ref Range   Troponin i, poc 0.02 0.00 - 0.08 ng/mL   Comment 3            Comment: Due to the release kinetics of cTnI, a negative result within the first hours of the onset of symptoms does not rule out myocardial infarction with certainty. If myocardial infarction is still suspected, repeat the test at appropriate intervals.   Brain natriuretic peptide     Status: Abnormal   Collection Time: 01/26/15  1:54 AM  Result Value Ref Range   B Natriuretic Peptide 982.6 (H) 0.0 - 100.0 pg/mL  CBC     Status: Abnormal   Collection Time: 01/26/15  9:16 AM  Result Value Ref Range   WBC 7.0 4.0 - 10.5 K/uL   RBC 2.52 (L) 4.22 - 5.81 MIL/uL   Hemoglobin 7.5 (L) 13.0 - 17.0 g/dL   HCT 22.2 (L) 39.0 - 52.0 %   MCV 88.1 78.0 - 100.0  fL   MCH 29.8 26.0 - 34.0 pg   MCHC 33.8 30.0 - 36.0 g/dL   RDW 14.2 11.5 - 15.5 %   Platelets 260 150 - 400 K/uL  Iron and TIBC     Status: Abnormal   Collection Time: 01/26/15  9:16 AM  Result Value Ref Range   Iron 14 (L) 45 - 182 ug/dL   TIBC 241 (L) 250 - 450 ug/dL   Saturation Ratios 6 (L) 17.9 - 39.5 %   UIBC 227 ug/dL  Ferritin     Status: None   Collection Time: 01/26/15  9:16 AM  Result Value Ref Range   Ferritin 194 24 - 336 ng/mL  Troponin I     Status: None   Collection Time: 01/26/15  9:16 AM  Result Value Ref Range   Troponin I 0.03 <0.031 ng/mL    Comment:        NO INDICATION OF MYOCARDIAL INJURY.   Comprehensive metabolic panel     Status: Abnormal   Collection Time: 01/26/15  9:16 AM  Result Value Ref Range   Sodium 137 135 - 145 mmol/L   Potassium 4.2 3.5 - 5.1 mmol/L   Chloride 102 101 - 111 mmol/L   CO2 24 22 - 32 mmol/L   Glucose, Bld 96 65 - 99 mg/dL   BUN 60 (H) 6 - 20 mg/dL   Creatinine, Ser 7.85 (H) 0.61 - 1.24 mg/dL   Calcium 6.3 (LL) 8.9 -  10.3 mg/dL    Comment: CRITICAL RESULT CALLED TO, READ BACK BY AND VERIFIED WITH: HOUSAND R RN 01/26/15 1011 COSTELLO B    Total Protein 6.0 (L) 6.5 - 8.1 g/dL   Albumin 2.4 (L) 3.5 - 5.0 g/dL   AST 12 (L) 15 - 41 U/L   ALT 15 (L) 17 - 63 U/L   Alkaline Phosphatase 155 (H) 38 - 126 U/L   Total Bilirubin 0.5 0.3 - 1.2 mg/dL   GFR calc non Af Amer 7 (L) >60 mL/min   GFR calc Af Amer 8 (L) >60 mL/min    Comment: (NOTE) The eGFR has been calculated using the CKD EPI equation. This calculation has not been validated in all clinical situations. eGFR's persistently <60 mL/min signify possible Chronic Kidney Disease.    Anion gap 11 5 - 15  Lipid panel     Status: Abnormal   Collection Time: 01/26/15  9:16 AM  Result Value Ref Range   Cholesterol 178 0 - 200 mg/dL   Triglycerides 100 <150 mg/dL   HDL 34 (L) >40 mg/dL   Total CHOL/HDL Ratio 5.2 RATIO   VLDL 20 0 - 40 mg/dL   LDL Cholesterol 124 (H) 0 -  99 mg/dL    Comment:        Total Cholesterol/HDL:CHD Risk Coronary Heart Disease Risk Table                     Men   Women  1/2 Average Risk   3.4   3.3  Average Risk       5.0   4.4  2 X Average Risk   9.6   7.1  3 X Average Risk  23.4   11.0        Use the calculated Patient Ratio above and the CHD Risk Table to determine the patient's CHD Risk.        ATP III CLASSIFICATION (LDL):  <100     mg/dL   Optimal  100-129  mg/dL   Near or Above                    Optimal  130-159  mg/dL   Borderline  160-189  mg/dL   High  >190     mg/dL   Very High   CBG monitoring, ED     Status: Abnormal   Collection Time: 01/26/15  9:44 AM  Result Value Ref Range   Glucose-Capillary 115 (H) 65 - 99 mg/dL    Dg Chest 2 View  01/25/2015   CLINICAL DATA:  Shortness of breath. Difficulty breathing since 2 weeks ago, slow progression. Audible wheezes.  EXAM: CHEST  2 VIEW  COMPARISON:  04/13/2012  FINDINGS: Mild cardiac enlargement without vascular congestion. Small bilateral pleural effusions greater on the left. Patchy nodular airspace disease throughout both lungs. Findings may represent multifocal pneumonia. TB or atypical pneumonia could have this appearance. Primary or metastatic neoplasm could also have this appearance. Followup after treatment of acute process is recommended.  IMPRESSION: Diffuse patchy nodular airspace disease throughout both lungs. Bilateral pleural effusions. Findings likely to represent infectious process, either multifocal pneumonia or atypical pneumonia. Metastatic disease could also have this appearance. Followup after resolution of acute process is recommended.   Electronically Signed   By: Lucienne Capers M.D.   On: 01/25/2015 23:31   US Renal  01/26/2015   CLINICAL DATA:  Chronic kidney disease.  EXAM: RENAL / URINARY TRACT ULTRASOUND COMPLETE  COMPARISON:  None.  FINDINGS: Right Kidney:  Length: 10.7 cm. Diffusely increased parenchymal echogenicity. No mass or  hydronephrosis visualized.  Left Kidney:  Length: 11.1 cm. Diffusely increased renal parenchymal echogenicity. No mass or hydronephrosis visualized.  Bladder:  Appears normal for degree of bladder distention.  Incidental: Bilateral pleural effusions.  IMPRESSION: 1. Increased renal parenchymal echogenicity consistent with chronic medical renal disease. No obstructive uropathy. 2. Bilateral pleural effusions, incidentally noted.   Electronically Signed   By: Jeb Levering M.D.   On: 01/26/2015 06:58     Blood pressure 145/77, pulse 85, temperature 98 F (36.7 C), temperature source Oral, resp. rate 21, height _0  (1.6 m), weight 163 lb (73.936 kg), SpO2 97 %.  General: middle aged Hispanic male, resting in bed, 2L O2 by nasal cannula  HEENT: Conjunctivae are normal. Pupils are 4 mm, direct, consensual, near, no scleral icterus, oropharynx clear Cardiac: RRR, JVD noted at the angle of the jaw Pulm: bilateral crackles present to the mid-lung Abd: soft, nontender, nondistended, BS present Ext: warm and well perfused, 1-2+ pitting edema R>L, hyperpigmentation noted just inferior to patella bilateral Neuro: CN II-XII intact, 5/5 upper and lower extremity strength, unable to elicit patellar reflex  Assessment/Plan:  #1 Oliguric acute on chronic kidney injury: Likely progression of his chronic disease given poorly controlled HTN, DM2. Symptoms suggestive of uremia, and CXR findings and increased weight consistent with volume overload. Renal US without atrophy bilaterally however interestingly enough will need to exclude other causes.  -Check UA -Monitor strict I&O -Continue Lasix 187m q8h to see if he responds -Check hepatitis panel, C3, C4 and follow-up HIV -Follow-up cardiac echo -Follow electrolytes tomorrow  #2 Normochromic anemia: Low iron, normal ferritin, low TIBC suggestive of anemia of chronic disease. Hb 9 on admission, down from 12 a few years ago.   #3 Hypocalcemia: Corrected  calcium 7.6.  -Follow-up PTH, Vitamin D  #4 CAP: Antibiotics per primary.  #5 HTN: Systolic BP trending in [id number] admission.  -Hold HCTZ as he is on Lasix IV -Increase amlodipine 568mto twice daily  #6 DM2: A1c 7.9, 2014. SSI per primary.  #7 CAD s/p DES x 2  PaCharlott Rakes/23/2016, 10:48 AM    Pt seen, examined, agree w assess/plan as above with additions as indicated. Unfortunate hispanic male with known hx of significant HTN, CAD and CKD.  Last creat was in 2014 at 2.0, last visit was with cardiology in Nov 2015 w no labs. BP's have run high looking at outside clinic notes.  Never has seen a renal MD and has has erratic followup w physicians.  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  He is vol overloaded. Will diurese , get UA.  [place] hydro.  Not optimistic about recovery. Check hep B/C and HIV. Will likely require dialysis this hospital stay given uremic-like symptoms. Will follow.  RoKelly SplinterD pager [phone]  cell [phone]/23/2016, 4:35 PM

## 2015-01-26 NOTE — ED Notes (Signed)
CBG 106 

## 2015-01-26 NOTE — H&P (Signed)
[facility] Admission History and Physical Service Pager: [phone]  Patient name: Stephen Lane Medical record number: [id number] Date of birth: Jul 03, 1960 Age: 54 y.o. Gender: male  Primary Care Provider: Phill Myron, MD Consultants: Nephrology Code Status: Full  Chief Complaint: short of breath  Assessment and Plan: Stephen Lane is a 54 y.o. male presenting with shortness of breath x 2 weeks. PMH is significant for CKD, CAD s/p stenting 2013, T2DM, HTN, HLD  # Dyspnea / CAP: Afebrile in ED with no additional oxygen requirement. WBC 8.1. CXR showing diffuse patchy nodular airspace disease bilaterally and bilateral pleural effusions. DDx would also include CHF exacerbation and fluid overload (see below) - continue ceftriaxone and azithromycin - supplemental o2 as needed  # HTN: initial BP 208/104. Prescribed amlodipine, lisinopril, hctz however has not been seen or prescribed in >1 year so likely not taking. - will restart amlodipine 5mg  and hctz 25mg  - hydralazine IV PRN for SBP >180, DBP >110  # Evidence of fluid overload: no prior diagnosis of CHF. BNP elevated to almost 1000 but this may be in setting of CKD. Bilateral pitting edema again could be explained by anemia or CKD. Does have a history of CAD.  - echo  # AKI on CKD: last Cr 05/2013 was 2.0, now presents with Cr 7.92 - hold ACEi - will give some IVF overnight to evaluate for improvement - renal ultrasound - consult nephrology in AM  # Anemia: Baseline hgb from 2 years ago around 11, now presents with Hgb 8.6. Has not had a colonoscopy. Would favor ACD but can't rule out other etiology - FOBT x 3 - Iron/TIBC/ferritin  # T2DM: last A1c in 2014 was 7.9 - CBG monitoring qAC qHS - SSI - repeat A1c  # HLD:  - switch pravastatin to atorvastatin - lipid panel  FEN/GI: diet carb mod / NS @ 100cc/hr overnight Prophylaxis: heparin sq  Disposition: admit  History of  Present Illness: Stephen Lane is a 54 y.o. male presenting with shortness of breath x 2 weeks. 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 He has a non-productive cough. He has not measured any fevers but has felt chills and has been nauseas. He has some possible intermittent chest pains associated with the cough. SOB at both rest and activity. No hemoptysis. Associated abdominal tenderness over past 2 weeks as well but does not endorse this until the physical exam. He has not been to a doctor recently and has not been taking any of the medicines that were prescribed to him. He has not noticed any decrease in his urine volume. He denies melena or blood in stool, has never had a colonoscopy.   Review Of Systems: Per HPI with the following additions: no HA, no trouble voiding,  Otherwise 12 point review of systems was performed and was unremarkable.  Patient Active Problem List   Diagnosis Date Noted  . CKD (chronic kidney disease), stage III 12/27/2012  . Diabetic neuropathy 06/04/2012  . Coronary Artery Disease 04/24/2012  . Hypertension, essential 04/24/2012  . Diabetes mellitus, insulin dependent (IDDM), controlled 04/24/2012  . Hyperlipidemia 04/17/2012  . GERD (gastroesophageal reflux disease) 04/15/2012  . Normocytic anemia 04/13/2012   Past Medical History: Past Medical History  Diagnosis Date  . Diabetes mellitus without complication   . Coronary atherosclerosis     a. admx with [place] 11/13 => LHC 04/15/12: pLAD 50%, oD1 75% (small), pCFX 40% followed  by mCFX 95%, pRCA 60%, mRCA 90%, EF 55-65%. PCI 04/15/12: DES to [facility] and DES to the [facility].  . Mixed hyperlipidemia   . Essential hypertension   . Cataract, left eye     s/p laser  . CKD (chronic kidney disease) stage 3, GFR 30-59 ml/min   . Carotid stenosis     a. Carotid US (05/2013):  1-39% bilateral ICA; brachial waveforms triphasic bilat; vertebrals with  antegrade flow bilat; repeat 1 year   Past Surgical History: Past Surgical History  Procedure Laterality Date  . Eye surgery      "blood behind eyeball" 2007   . Left heart catheterization with coronary angiogram N/A 04/15/2012    Procedure: LEFT HEART CATHETERIZATION WITH CORONARY ANGIOGRAM;  Surgeon: Larey Dresser, MD;  Location: [facility] CATH LAB;  Service: Cardiovascular;  Laterality: N/A;  . Percutaneous coronary stent intervention (pci-s) N/A 04/15/2012    Procedure: PERCUTANEOUS CORONARY STENT INTERVENTION (PCI-S);  Surgeon: Sherren Mocha, MD;  Location: [facility] CATH LAB;  Service: Cardiovascular;  Laterality: N/A;   Social History: Social History  Substance Use Topics  . Smoking status: Former Smoker -- 10 years  . Smokeless tobacco: Never Used  . Alcohol Use: No   Additional social history: none  Please also refer to relevant sections of EMR.  Family History: Family History  Problem Relation Age of Onset  . Hypertension Mother     died 63 years old unknown reasons  . Other Father     died when Vinod was 55 years old from "infection"  . Diabetes Brother   . Hypertension Sister    Allergies and Medications: No Known Allergies No current facility-administered medications on file prior to encounter.   Current Outpatient Prescriptions on File Prior to Encounter  Medication Sig Dispense Refill  . amLODipine (NORVASC) 5 MG tablet Take 1 tablet (5 mg total) by mouth daily. (Patient not taking: Reported on 01/25/2015) 90 tablet 3  . aspirin EC 81 MG EC tablet Take 1 tablet (81 mg total) by mouth daily. (Patient not taking: Reported on 01/25/2015)    . hydrochlorothiazide (HYDRODIURIL) 25 MG tablet Take 1 tablet (25 mg total) by mouth daily. (Patient not taking: Reported on 01/25/2015) 30 tablet 11  . lisinopril (PRINIVIL,ZESTRIL) 40 MG tablet Take 1 tablet (40 mg total) by mouth daily. (Patient not taking: Reported on 01/25/2015) 30 tablet 11  . pravastatin (PRAVACHOL) 40 MG tablet  Take 1 tablet (40 mg total) by mouth daily. (Patient not taking: Reported on 01/25/2015) 30 tablet 11    Objective: BP 170/95 mmHg  Pulse 97  Temp(Src) 98 F (36.7 C) (Oral)  Resp 25  Ht $R'5\' 3"'vj$  (1.6 m)  Wt 163 lb (73.936 kg)  BMI 28.88 kg/m2  SpO2 96% Exam: General: NAD Eyes: PERRL, EOMI. Sclera normal. ENTM: Slightly dry MM, no oropharyngeal lesions Neck: normal Cardiovascular: tachycardic, regular rhythm, no mrg. 2+ radial pulses, unable to appreciate pedal pulses bilaterally but toes/feet are WWP. 2+ pitting edema bilaterally in LE up to mid shin. Respiratory: crackles at both bases and right midlung. There are rhonchi and wheezes scattered bilaterally Abdomen: obese, mildly tender primarily RUQ with mild guarding but no rebound, normal bowel sounds MSK: no deformities, normal bulk/tone Skin: no rashes Neuro: alert and oriented, no focal deficits Psych: normal thought content and speech.  Labs and Imaging: CBC BMET   Recent Labs Lab 01/25/15 2236  WBC 8.1  HGB 8.6*  HCT 25.1*  PLT 305    Recent Labs  Lab 01/25/15 2236  NA 136  K 4.2  CL 100*  CO2 22  BUN 63*  CREATININE 7.92*  GLUCOSE 129*  CALCIUM 6.6*     Dg Chest 2 View  01/25/2015   CLINICAL DATA:  Shortness of breath. Difficulty breathing since 2 weeks ago, slow progression. Audible wheezes.  EXAM: CHEST  2 VIEW  COMPARISON:  04/13/2012  FINDINGS: Mild cardiac enlargement without vascular congestion. Small bilateral pleural effusions greater on the left. Patchy nodular airspace disease throughout both lungs. Findings may represent multifocal pneumonia. TB or atypical pneumonia could have this appearance. Primary or metastatic neoplasm could also have this appearance. Followup after treatment of acute process is recommended.  IMPRESSION: Diffuse patchy nodular airspace disease throughout both lungs. Bilateral pleural effusions. Findings likely to represent infectious process, either multifocal pneumonia or  atypical pneumonia. Metastatic disease could also have this appearance. Followup after resolution of acute process is recommended.   Electronically Signed   By: Lucienne Capers M.D.   On: 01/25/2015 23:31     Leone Brand, MD 01/26/2015, 2:27 AM PGY-3, [place] [facility] pager: [phone], text pages welcome

## 2015-01-26 NOTE — Progress Notes (Signed)
Pt admitted to room 6E30 from Ed.  Dr Posey Pronto in with pt.

## 2015-01-26 NOTE — ED Notes (Signed)
Breakfast tray ordered 

## 2015-01-26 NOTE — Progress Notes (Signed)
Received report from ED, Orvil Feil.

## 2015-01-26 NOTE — Care Management Note (Signed)
Case Management Note  Patient Details  Name: Olaoluwa Grieder MRN: [id number] Date of Birth: 03-Aug-1960  Subjective/Objective:      Adm w pneumonia              Action/Plan: lives w wife, pcp dr Phill Myron   Expected Discharge Date:                  Expected Discharge Plan:  [place]  In-House Referral:     Discharge planning Services     Post Acute Care Choice:    Choice offered to:     DME Arranged:    DME Agency:     HH Arranged:    [place] Agency:     Status of Service:     Medicare Important Message Given:    Date Medicare IM Given:    Medicare IM give by:    Date Additional Medicare IM Given:    Additional Medicare Important Message give by:     If discussed at [place] of Stay Meetings, dates discussed:    Additional Comments: ur review done  Lacretia Leigh, RN 01/26/2015, 1:49 PM

## 2015-01-26 NOTE — ED Notes (Signed)
CBG 115.  Holding insulin.

## 2015-01-26 NOTE — ED Provider Notes (Signed)
CSN: [id number]   Arrival date & time 01/25/15 2204  History  This chart was scribed for Ripley Fraise, MD by Altamease Oiler, ED Scribe. This patient was seen in room A08C/A08C and the patient's care was started at 1:41 AM.  Chief Complaint  Patient presents with  . Shortness of Breath    HPI Patient is a 54 y.o. male presenting with shortness of breath. The history is provided by the patient. A language interpreter was used (#[id number]).  Shortness of Breath Severity:  Mild Onset quality:  Gradual Duration:  2 weeks Timing:  Constant Progression:  Worsening Chronicity:  New Relieved by:  Nothing Worsened by:  Nothing tried Ineffective treatments:  None tried Associated symptoms: chest pain, cough, headaches and sore throat   Associated symptoms: no abdominal pain, no fever and no vomiting    Stephen Lane is a 54 y.o. male with PMHx of DM, HTN, HLD, CKD,  who presents to the Emergency Department complaining of increasing SOB with gradual onset 2 weeks ago. Associated symptoms include mild cough, mild chest pain, sore throat, nausea, headache, bilateral foot swelling. Pt denies fever, vomiting,diarrhea. Last urinated around 8 PM.  He has not been taking his prescribed medication. No recent travel.   Past Medical History  Diagnosis Date  . Diabetes mellitus without complication   . Coronary atherosclerosis     a. admx with [place] 11/13 => LHC 04/15/12: pLAD 50%, oD1 75% (small), pCFX 40% followed by mCFX 95%, pRCA 60%, mRCA 90%, EF 55-65%. PCI 04/15/12: DES to the [facility] and DES to the [facility].  . Mixed hyperlipidemia   . Essential hypertension   . Cataract, left eye     s/p laser  . CKD (chronic kidney disease) stage 3, GFR 30-59 ml/min   . Carotid stenosis     a. Carotid US (05/2013):  1-39% bilateral ICA; brachial waveforms triphasic bilat; vertebrals with antegrade flow bilat; repeat 1 year    Past Surgical History  Procedure Laterality Date  . Eye surgery      "blood  behind eyeball" 2007   . Left heart catheterization with coronary angiogram N/A 04/15/2012    Procedure: LEFT HEART CATHETERIZATION WITH CORONARY ANGIOGRAM;  Surgeon: Larey Dresser, MD;  Location: [facility] CATH LAB;  Service: Cardiovascular;  Laterality: N/A;  . Percutaneous coronary stent intervention (pci-s) N/A 04/15/2012    Procedure: PERCUTANEOUS CORONARY STENT INTERVENTION (PCI-S);  Surgeon: Sherren Mocha, MD;  Location: [facility] CATH LAB;  Service: Cardiovascular;  Laterality: N/A;    Family History  Problem Relation Age of Onset  . Hypertension Mother     died 58 years old unknown reasons  . Other Father     died when Kelyn was 1 years old from "infection"  . Diabetes Brother   . Hypertension Sister     Social History  Substance Use Topics  . Smoking status: Former Smoker -- 10 years  . Smokeless tobacco: Never Used  . Alcohol Use: No     Review of Systems  Constitutional: Negative for fever.  HENT: Positive for sore throat.   Respiratory: Positive for cough and shortness of breath.   Cardiovascular: Positive for chest pain.  Gastrointestinal: Positive for nausea. Negative for vomiting, abdominal pain and diarrhea.  Neurological: Positive for headaches.  All other systems reviewed and are negative.   Home Medications   Prior to Admission medications   Medication Sig Start Date End Date Taking? Authorizing Provider  amLODipine (NORVASC) 5 MG tablet Take 1 tablet (5  mg total) by mouth daily. Patient not taking: Reported on 01/25/2015 09/15/13   Sherren Mocha, MD  aspirin EC 81 MG EC tablet Take 1 tablet (81 mg total) by mouth daily. Patient not taking: Reported on 01/25/2015 04/17/12   Orson Eva, MD  hydrochlorothiazide (HYDRODIURIL) 25 MG tablet Take 1 tablet (25 mg total) by mouth daily. Patient not taking: Reported on 01/25/2015 12/27/12   Marin Olp, MD  lisinopril (PRINIVIL,ZESTRIL) 40 MG tablet Take 1 tablet (40 mg total) by mouth daily. Patient not taking:  Reported on 01/25/2015 04/23/13   Sherren Mocha, MD  pravastatin (PRAVACHOL) 40 MG tablet Take 1 tablet (40 mg total) by mouth daily. Patient not taking: Reported on 01/25/2015 04/17/12   Orson Eva, MD    Allergies  Review of patient's allergies indicates no known allergies.  Triage Vitals: BP 181/91 mmHg  Pulse 98  Temp(Src) 98 F (36.7 C) (Oral)  Resp 21  Ht $R'5\' 3"'ow$  (1.6 m)  Wt 163 lb (73.936 kg)  BMI 28.88 kg/m2  SpO2 96%  Physical Exam  Nursing note and vitals reviewed. CONSTITUTIONAL: Well developed/well nourished, no distress HEAD: Normocephalic/atraumatic EYES: EOMI ENMT: Mucous membranes moist NECK: supple no meningeal signs SPINE/BACK:entire spine nontender CV: S1/S2 noted, no murmurs/rubs/gallops noted LUNGS:Wheezing bilaterally  ABDOMEN: soft, nontender, no rebound or guarding, bowel sounds noted throughout abdomen GU:no cva tenderness NEURO: Pt is awake/alert/appropriate, moves all extremitiesx4.  No facial droop.   EXTREMITIES: pulses normal/equal, full ROM, pitting edema to bilateral LE  SKIN: warm, color normal PSYCH: no abnormalities of mood noted, alert and oriented to situation   ED Course  Procedures  Medications  albuterol (PROVENTIL) (2.5 MG/3ML) 0.083% nebulizer solution (not administered)  cefTRIAXone (ROCEPHIN) 1 g in dextrose 5 % 50 mL IVPB (1 g Intravenous New Bag/Given 01/26/15 0205)  azithromycin (ZITHROMAX) 500 mg in dextrose 5 % 250 mL IVPB (not administered)  albuterol (PROVENTIL) (2.5 MG/3ML) 0.083% nebulizer solution 5 mg (5 mg Nebulization Given 01/25/15 2241)  ipratropium-albuterol (DUONEB) 0.5-2.5 (3) MG/3ML nebulizer solution 3 mL (3 mLs Nebulization Given 01/26/15 0205)     DIAGNOSTIC STUDIES: Oxygen Saturation is 96% on RA, normal by my interpretation.    COORDINATION OF CARE: 1:51 AM Discussed treatment plan which includes CXR, lab work, EKG, abx, and admission to the hospital with pt at bedside and pt agreed to plan.  2:25  AM-Consult complete with Family Practice Resident. Patient case explained and discussed. He agrees to admit patient for further evaluation and treatment. Call ended at 2:27 AM  Pt with acute worsening renal failure After reviewing CXR, favor pneumonia Will admit Pt stable in ED  Labs Reviewed  BASIC METABOLIC PANEL - Abnormal; Notable for the following:    Chloride 100 (*)    Glucose, Bld 129 (*)    BUN 63 (*)    Creatinine, Ser 7.92 (*)    Calcium 6.6 (*)    GFR calc non Af Amer 7 (*)    GFR calc Af Amer 8 (*)    All other components within normal limits  CBC - Abnormal; Notable for the following:    RBC 2.84 (*)    Hemoglobin 8.6 (*)    HCT 25.1 (*)    All other components within normal limits  I-STAT TROPOININ, ED    I, Ripley Fraise, MD, personally reviewed and evaluated these images and lab results as part of my medical decision-making.  Imaging Review Dg Chest 2 View  01/25/2015   CLINICAL DATA:  Shortness  of breath. Difficulty breathing since 2 weeks ago, slow progression. Audible wheezes.  EXAM: CHEST  2 VIEW  COMPARISON:  04/13/2012  FINDINGS: Mild cardiac enlargement without vascular congestion. Small bilateral pleural effusions greater on the left. Patchy nodular airspace disease throughout both lungs. Findings may represent multifocal pneumonia. TB or atypical pneumonia could have this appearance. Primary or metastatic neoplasm could also have this appearance. Followup after treatment of acute process is recommended.  IMPRESSION: Diffuse patchy nodular airspace disease throughout both lungs. Bilateral pleural effusions. Findings likely to represent infectious process, either multifocal pneumonia or atypical pneumonia. Metastatic disease could also have this appearance. Followup after resolution of acute process is recommended.   Electronically Signed   By: Lucienne Capers M.D.   On: 01/25/2015 23:31    EKG Interpretation  Date/Time:  Monday January 25 2015 22:31:07  EDT Ventricular Rate:  84 PR Interval:  152 QRS Duration: 72 QT Interval:  408 QTC Calculation: 482 R Axis:   69 Text Interpretation:  Normal sinus rhythm Prolonged QT Abnormal ECG artifact noted Confirmed by Christy Gentles  MD, Elenore Rota ([id number]) on 01/26/2015 1:40:50 AM       MDM   Final diagnoses:  CAP (community acquired pneumonia)  Acute renal failure, unspecified acute renal failure type     Nursing notes including past medical history and social history reviewed and considered in documentation Labs/vital reviewed myself and considered during evaluation xrays/imaging reviewed by myself and considered during evaluation   I personally performed the services described in this documentation, which was scribed in my presence. The recorded information has been reviewed and is accurate.    Ripley Fraise, MD 01/26/15 [phone]

## 2015-01-26 NOTE — Progress Notes (Signed)
  Echocardiogram 2D Echocardiogram has been performed.  Darlina Sicilian M 01/26/2015, 3:09 PM

## 2015-01-27 ENCOUNTER — Inpatient Hospital Stay (HOSPITAL_COMMUNITY): Payer: Medicaid Other

## 2015-01-27 DIAGNOSIS — N186 End stage renal disease: Secondary | ICD-10-CM

## 2015-01-27 DIAGNOSIS — N189 Chronic kidney disease, unspecified: Secondary | ICD-10-CM

## 2015-01-27 DIAGNOSIS — E1122 Type 2 diabetes mellitus with diabetic chronic kidney disease: Secondary | ICD-10-CM

## 2015-01-27 DIAGNOSIS — I5031 Acute diastolic (congestive) heart failure: Secondary | ICD-10-CM

## 2015-01-27 LAB — CBC
HEMATOCRIT: 23.2 % — AB (ref 39.0–52.0)
Hemoglobin: 7.8 g/dL — ABNORMAL LOW (ref 13.0–17.0)
MCH: 29.5 pg (ref 26.0–34.0)
MCHC: 33.6 g/dL (ref 30.0–36.0)
MCV: 87.9 fL (ref 78.0–100.0)
PLATELETS: 306 10*3/uL (ref 150–400)
RBC: 2.64 MIL/uL — ABNORMAL LOW (ref 4.22–5.81)
RDW: 14.2 % (ref 11.5–15.5)
WBC: 6.5 10*3/uL (ref 4.0–10.5)

## 2015-01-27 LAB — BASIC METABOLIC PANEL
Anion gap: 14 (ref 5–15)
BUN: 63 mg/dL — AB (ref 6–20)
CALCIUM: 6.4 mg/dL — AB (ref 8.9–10.3)
CO2: 23 mmol/L (ref 22–32)
CREATININE: 8.09 mg/dL — AB (ref 0.61–1.24)
Chloride: 96 mmol/L — ABNORMAL LOW (ref 101–111)
GFR calc Af Amer: 8 mL/min — ABNORMAL LOW (ref >60)
GFR, EST NON AFRICAN AMERICAN: 7 mL/min — AB (ref >60)
GLUCOSE: 86 mg/dL (ref 65–99)
Potassium: 3.9 mmol/L (ref 3.5–5.1)
Sodium: 133 mmol/L — ABNORMAL LOW (ref 135–145)

## 2015-01-27 LAB — URINALYSIS W MICROSCOPIC (NOT AT ARMC)
BILIRUBIN URINE: NEGATIVE
GLUCOSE, UA: 100 mg/dL — AB
Ketones, ur: NEGATIVE mg/dL
Leukocytes, UA: NEGATIVE
Nitrite: NEGATIVE
PH: 5.5 (ref 5.0–8.0)
Protein, ur: >300 mg/dL — AB
SPECIFIC GRAVITY, URINE: 1.009 (ref 1.005–1.030)
UROBILINOGEN UA: 0.2 mg/dL (ref 0.0–1.0)

## 2015-01-27 LAB — CALCIUM, IONIZED: Calcium, Ionized, Serum: 3.4 mg/dL — ABNORMAL LOW (ref 4.5–5.6)

## 2015-01-27 LAB — HEMOGLOBIN A1C
Hgb A1c MFr Bld: 6.3 % — ABNORMAL HIGH (ref 4.8–5.6)
MEAN PLASMA GLUCOSE: 134 mg/dL

## 2015-01-27 LAB — GLUCOSE, CAPILLARY
GLUCOSE-CAPILLARY: 50 mg/dL — AB (ref 65–99)
Glucose-Capillary: 87 mg/dL (ref 65–99)
Glucose-Capillary: 94 mg/dL (ref 65–99)
Glucose-Capillary: 122 mg/dL — ABNORMAL HIGH (ref 65–99)

## 2015-01-27 LAB — PARATHYROID HORMONE, INTACT (NO CA): PTH: 213 pg/mL — AB (ref 15–65)

## 2015-01-27 LAB — HIV ANTIBODY (ROUTINE TESTING W REFLEX): HIV Screen 4th Generation wRfx: NONREACTIVE

## 2015-01-27 MED ORDER — CEFAZOLIN SODIUM 1-5 GM-% IV SOLN
1.0000 g | INTRAVENOUS | Status: DC
  Filled 2015-01-27: qty 50

## 2015-01-27 MED ORDER — CALCIUM ACETATE (PHOS BINDER) 667 MG PO CAPS: 1334.0000 mg | ORAL_CAPSULE | Freq: Three times a day (TID) | ORAL | Status: DC

## 2015-01-27 MED ORDER — CALCITRIOL 0.25 MCG PO CAPS
0.2500 ug | ORAL_CAPSULE | Freq: Every day | ORAL | Status: DC
  Administered 2015-01-27: 0.25 ug via ORAL
  Filled 2015-01-27: qty 1

## 2015-01-27 MED ORDER — CALCIUM ACETATE (PHOS BINDER) 667 MG PO CAPS
667.0000 mg | ORAL_CAPSULE | Freq: Three times a day (TID) | ORAL | Status: DC
  Administered 2015-01-27 – 2015-02-02 (×18): 667 mg via ORAL
  Filled 2015-01-27 (×13): qty 1

## 2015-01-27 MED ORDER — CALCITRIOL 0.25 MCG PO CAPS
0.2500 ug | ORAL_CAPSULE | Freq: Every day | ORAL | Status: DC
  Administered 2015-01-29 – 2015-02-04 (×7): 0.25 ug via ORAL
  Filled 2015-01-27 (×7): qty 1

## 2015-01-27 NOTE — Progress Notes (Signed)
Patient and his husband watched hemodialysis on education channel.

## 2015-01-27 NOTE — Progress Notes (Signed)
VASCULAR LAB PRELIMINARY  PRELIMINARY  PRELIMINARY  PRELIMINARY   Right  Upper Extremity Vein Map    Cephalic  Segment Diameter Depth Comment  1. Axilla 4.72mm mm   2. Mid upper arm 3.59mm mm branch  3. Above AC 2.24mm mm   4. In [facility] 3.82mm mm   5. Below AC mm mm IV bandages  6. Mid forearm 2.48mm mm   7. Wrist 2.30mm mm branch   mm mm    mm mm    mm mm    Basilic  Segment Diameter Depth Comment  1. Axilla mm mm   2. Mid upper arm 6.16mm 6.51mm   3. Above AC 2.24mm mm branch  4. In [facility] 2.4mm mm anterior branch with IV measures 4.93 mm  5. Below AC mm mm Multiple branches/ too small  6. Mid forearm mm mm   7. Wrist mm mm    mm mm    mm mm    mm mm       Left Upper Extremity Vein Map    Cephalic  Segment Diameter Depth Comment  1. Axilla 4.78mm mm   2. Mid upper arm 3.2mm mm   3. Above AC 3.48mm mm branch  4. In [facility] 4.27mm mm   5. Below AC 2.62mm mm Multiple branches  6. Mid forearm 2.21mm mm   7. Wrist 1.70mm mm    mm mm    mm mm    mm mm    Basilic  Segment Diameter Depth Comment  1. Axilla mm mm   2. Mid upper arm 3.41mm 8.104mm   3. Above [facility] 3.23mm 2.69mm branch  4. In AC mm mm Multiple branches/ too small  5. Below AC mm mm   6. Mid forearm mm mm   7. Wrist mm mm    mm mm    mm mm    mm mm     Landry Mellow, RDMS, RVT  01/27/2015, 3:07 PM

## 2015-01-27 NOTE — Progress Notes (Signed)
[facility] MD responded. Critical Calcium value known. Awaiting orders. Will pass on to oncoming shift.

## 2015-01-27 NOTE — Consult Note (Signed)
Admit date: 01/26/2015 Referring Physician: Family Medicine Primary Physician Phill Myron, MD Primary Cardiologist : Dr. Burt Knack Reason for Consultation: CHF  HPI: [name] is a 54 yo male with PMHx of of CAD s/p DES in 2013 to LCX and RCA, HLD, HTN, CKD and T2DM who presented to the ED on 8/23 with complaint of shortness of breath worsening over the past 2 weeks and associated non-productive cough. Patient states he has not taken any of his medicines in one year. 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   Patient was volume overloaded on examination with crackles, JVD and pitting lower extremity edema concerning for volume overload secondary to ARF. CXR showed diffuse patchy nodular airspace disease throughout both lungs and bilateral pleural effusions. BNP elevated at 982.6. Patient had no prior diagnosis of CHF; therefore Echo was performed which showed EF 85-02%, grade 2 diastolic dysfunction, trace AR, MR and mild TR. Patient admits to DOE, orthopnea, and leg swelling for the past 3 weeks. He denies any chest pain at rest or with exertion. Cardiology was consulted for assistance with CHF.   PMH:   Past Medical History  Diagnosis Date  . Coronary atherosclerosis     a. admx with [place] 11/13 => LHC 04/15/12: pLAD 50%, oD1 75% (small), pCFX 40% followed by mCFX 95%, pRCA 60%, mRCA 90%, EF 55-65%. PCI 04/15/12: DES to the [facility] and DES to the [facility].  . Mixed hyperlipidemia   . Essential hypertension   . CKD (chronic kidney disease) stage 3, GFR 30-59 ml/min   . Carotid stenosis     a. Carotid US (05/2013):  1-39% bilateral ICA; brachial waveforms triphasic bilat; vertebrals with antegrade flow bilat; repeat 1 year  . Type II diabetes mellitus   . Daily headache   . Chronic high back pain     "where the lungs are located" (01/26/2015)    PSH:   Past Surgical History  Procedure Laterality Date  . Left heart catheterization  with coronary angiogram N/A 04/15/2012    Procedure: LEFT HEART CATHETERIZATION WITH CORONARY ANGIOGRAM;  Surgeon: Larey Dresser, MD;  Location: [facility] CATH LAB;  Service: Cardiovascular;  Laterality: N/A;  . Percutaneous coronary stent intervention (pci-s) N/A 04/15/2012    Procedure: PERCUTANEOUS CORONARY STENT INTERVENTION (PCI-S);  Surgeon: Sherren Mocha, MD;  Location: [facility] CATH LAB;  Service: Cardiovascular;  Laterality: N/A;  . Coronary angioplasty with stent placement  04/15/2012  . Cataract extraction w/ intraocular lens implant Left   . Eye surgery Right 2007    "blood behind eyeball"   Allergies:  Review of patient's allergies indicates no known allergies. Prior to Admit Meds:   Prior to Admission medications   Medication Sig Start Date End Date Taking? Authorizing Provider  amLODipine (NORVASC) 5 MG tablet Take 1 tablet (5 mg total) by mouth daily. Patient not taking: Reported on 01/25/2015 09/15/13   Sherren Mocha, MD  aspirin EC 81 MG EC tablet Take 1 tablet (81 mg total) by mouth daily. Patient not taking: Reported on 01/25/2015 04/17/12   Orson Eva, MD  hydrochlorothiazide (HYDRODIURIL) 25 MG tablet Take 1 tablet (25 mg total) by mouth daily. Patient not taking: Reported on 01/25/2015 12/27/12   Marin Olp, MD  lisinopril (PRINIVIL,ZESTRIL) 40 MG tablet Take 1 tablet (40 mg total) by mouth daily. Patient not taking: Reported on 01/25/2015 04/23/13   Sherren Mocha, MD  pravastatin (PRAVACHOL) 40 MG tablet Take 1  tablet (40 mg total) by mouth daily. Patient not taking: Reported on 01/25/2015 04/17/12   Orson Eva, MD   Fam HX:    Family History  Problem Relation Age of Onset  . Hypertension Mother     died 29 years old unknown reasons  . Other Father     died when Amaziah was 70 years old from "infection"  . Diabetes Brother   . Hypertension Sister    Social HX:    Social History   Social History  . Marital Status: Married    Spouse Name: N/A  . Number of Children:  N/A  . Years of Education: N/A   Occupational History  . Not on file.   Social History Main Topics  . Smoking status: Former Smoker -- 0.25 packs/day for 10 years    Types: Cigarettes  . Smokeless tobacco: Never Used     Comment: "quit smoking cigarettes in the 1980's"  . Alcohol Use: Yes     Comment: "quit drinking in ~ 2010"  . Drug Use: No  . Sexual Activity: No   Other Topics Concern  . Not on file   Social History Narrative   Lives with wife and 2 daughters and 1 grandchild in [place]. 6th grade education only. Daughter comes to some visits and speaks/writes english and can help interpret AVS if in [place] or [place].      ROS:   General: Admits to decreased appetite. Denies fever, chills, fatigue, and diaphoresis.  Respiratory: Admits to SOB, non-productive cough, DOE. Denies chest tightness, and wheezing.   Cardiovascular: Denies chest pain and palpitations.  Gastrointestinal: Denies nausea, vomiting, abdominal pain, diarrhea, constipation Genitourinary: Admits to decreased urination. Denies dysuria, urgency, frequency Skin: Denies pallor, rash and wounds.  Neurological: Denies dizziness, headaches, weakness, lightheadedness Psychiatric/Behavioral: Denies confusion  Filed Vitals:   01/26/15 1230 01/26/15 1542 01/26/15 2208 01/27/15 0500  BP: 151/81 153/90 153/84 130/80  Pulse: 86 84 90 79  Temp:  98.1 F (36.7 C) 97.9 F (36.6 C) 97.9 F (36.6 C)  TempSrc:  Oral Oral Oral  Resp: $Remo'28 18 18 16  'nxgFn$ Height:  $Remove'5\' 3"'DiJaMjF$  (1.6 m)    Weight:  164 lb 10.9 oz (74.7 kg)    SpO2: 95% 100% 94% 95%   General: Vital signs reviewed.  Patient is well-developed and well-nourished, in no acute distress and cooperative with exam.  Neck: Supple, + JVD, no carotid bruit present.  Cardiovascular: RRR, S1 normal, S2 normal, no murmurs, gallops, or rubs. Pulmonary/Chest: Diffuse inspiratory crackles, no wheezes, or rhonchi. Abdominal: Soft, non-tender, non-distended, BS + Extremities: 2+ pitting  edema in lower extremities bilaterally, pulses symmetric and intact bilaterally.  Neurological: A&O x3, no asterixis. Skin: Warm, dry and intact. No rashes or erythema. Psychiatric: Normal mood and affect. speech and behavior is normal. Cognition and memory are normal.      Labs: Lab Results  Component Value Date   WBC 6.5 01/27/2015   HGB 7.8* 01/27/2015   HCT 23.2* 01/27/2015   MCV 87.9 01/27/2015   PLT 306 01/27/2015     Recent Labs Lab 01/26/15 0916 01/27/15 0540  NA 137 133*  K 4.2 3.9  CL 102 96*  CO2 24 23  BUN 60* 63*  CREATININE 7.85* 8.09*  CALCIUM 6.3* 6.4*  PROT 6.0*  --   BILITOT 0.5  --   ALKPHOS 155*  --   ALT 15*  --   AST 12*  --   GLUCOSE 96 86  Recent Labs  01/26/15 0916  TROPONINI 0.03   Lab Results  Component Value Date   CHOL 178 01/26/2015   HDL 34* 01/26/2015   LDLCALC 124* 01/26/2015   TRIG 100 01/26/2015    Radiology:  Dg Chest 2 View  01/25/2015   CLINICAL DATA:  Shortness of breath. Difficulty breathing since 2 weeks ago, slow progression. Audible wheezes.  EXAM: CHEST  2 VIEW  COMPARISON:  04/13/2012  FINDINGS: Mild cardiac enlargement without vascular congestion. Small bilateral pleural effusions greater on the left. Patchy nodular airspace disease throughout both lungs. Findings may represent multifocal pneumonia. TB or atypical pneumonia could have this appearance. Primary or metastatic neoplasm could also have this appearance. Followup after treatment of acute process is recommended.  IMPRESSION: Diffuse patchy nodular airspace disease throughout both lungs. Bilateral pleural effusions. Findings likely to represent infectious process, either multifocal pneumonia or atypical pneumonia. Metastatic disease could also have this appearance. Followup after resolution of acute process is recommended.   Electronically Signed   By: Lucienne Capers M.D.   On: 01/25/2015 23:31   US Renal  01/26/2015   CLINICAL DATA:  Chronic kidney disease.   EXAM: RENAL / URINARY TRACT ULTRASOUND COMPLETE  COMPARISON:  None.  FINDINGS: Right Kidney:  Length: 10.7 cm. Diffusely increased parenchymal echogenicity. No mass or hydronephrosis visualized.  Left Kidney:  Length: 11.1 cm. Diffusely increased renal parenchymal echogenicity. No mass or hydronephrosis visualized.  Bladder:  Appears normal for degree of bladder distention.  Incidental: Bilateral pleural effusions.  IMPRESSION: 1. Increased renal parenchymal echogenicity consistent with chronic medical renal disease. No obstructive uropathy. 2. Bilateral pleural effusions, incidentally noted.   Electronically Signed   By: Jeb Levering M.D.   On: 01/26/2015 06:58   Personally viewed.  EKG:  NSR, prolonged QT. Personally viewed.   ASSESSMENT/PLAN:    Volume Overload 2/2 ARF: Patient admits to shortness of breath, orthopnea. CXR with effusions, BNP elevated >900. Echo on 01/26/15 showed EF 50-55%, hypokinesis of mid-apical lateral myocardium, grade 2 diastolic dysfunction and trace AI, trace MR, and mild TR. ARF is the largest contributor of patient's volume overload versus contribution from diastolic dysfunction. Nephrology plans to diurese patient and pursue HD. For management of diastolic CHF, patient's blood pressure should be well controlled. No particular regimen has been shown to improve mortality in diastolic dysfunction; therefore, current regimen is acceptable.  -Agree with lasix and HD per nephrology -Agree with HCTZ 25 mg daily and amlodipine 5 mg daily  CAD: S/p LHC on 04/2012 and s/p DES to mid-LCx and mid-RCA. LHC also showed LAD 50%, oDI 75%, pLCx 40%, pRCA 60%. Patient completed a year of dual antiplatelet therapy. Per last cardiology note in 10/2103, patient was to be on carvedilol 12.5 mg BID, HCTZ 25 mg daily, lisinopril 40 mg daily, amlodipine 5 mg daily, and pravastatin 40 mg daily; however, patient states he has not taken any medications in the last year. Patient denies any chest  pain. There is no need for further intervention at this time and patient can follow up with Dr. Burt Knack as outpatient.  -Continue pravastatin 40 mg daily -Continue ASA 81 mg daily -Control BP -Follow up with Dr. Burt Knack as outpatient  Osa Craver, DO PGY-2 Internal Medicine Resident Pager # [phone] 01/27/2015 10:16 AM  Patient seen and examined. I agree with the assessment and plan as detailed above. See also my additional thoughts below.   The patient has known coronary disease that has been stable. He has not been  having any chest pain. It is been outlined above that he has not been taking his medications. He now presents with severe renal failure and volume overload. His volume overload is related to his renal dysfunction. There is no acute cardiac abnormality. No further in-hospital testing is needed at this time. His ejection fraction is maintained in the low normal range. He does have a wall motion abnormality. I cannot tell if this is old or new. There is no reason to assess this at this time. The plan will be to treat his renal dysfunction and carefully control his blood pressure and his volume status. He can follow-up with Dr. Burt Knack for cardiology follow-up as an outpatient after his hospitalization.Dola Argyle, MD, [facility] 01/27/2015 10:40 AM

## 2015-01-27 NOTE — Progress Notes (Signed)
Lab called with critical calcium of 6.4 resulting. [facility] hospitalist paged. Waiting on response.

## 2015-01-27 NOTE — Progress Notes (Signed)
Re-educated patient on not eating or drinking after midnight due to impending surgery tomorrow morning with [place], [place], as Optometrist.  Patient did not have any questions regarding surgery.  Wife in room with patient.  Will continue to monitor.

## 2015-01-27 NOTE — Consult Note (Signed)
[facility] NOTE   MRN : [id number]  Reason for Consult: ESRD Referring Physician: Dr. Posey Pronto  History of Present Illness: 54 y/o male with ARF with uremia likely secondary to uncontrolled HTN and now likely CKDV/ESRD.   He came to the hospital with a 2 week history of SOB.  He has not taken HTN medications for more than a year.  We have been consulted for placement of HD catheter and dialysis long term access.  Past medical history DM, HTN, hypercholesterolemia and noted CKd stage 3 2013.  He does not speak english we used a telephone interpreter.       Current Facility-Administered Medications  Medication Dose Route Frequency Provider Last Rate Last Dose  . acetaminophen (TYLENOL) tablet 650 mg  650 mg Oral Q6H PRN Leone Brand, MD       Or  . acetaminophen (TYLENOL) suppository 650 mg  650 mg Rectal Q6H PRN Leone Brand, MD      . amLODipine (NORVASC) tablet 5 mg  5 mg Oral BID Riccardo Dubin, MD   5 mg at 01/27/15 1049  . atorvastatin (LIPITOR) tablet 40 mg  40 mg Oral q1800 Leone Brand, MD   40 mg at 01/26/15 1820  . calcitRIOL (ROCALTROL) capsule 0.25 mcg  0.25 mcg Oral Daily Veatrice Bourbon, MD   0.25 mcg at 01/27/15 1049  . calcitRIOL (ROCALTROL) capsule 0.25 mcg  0.25 mcg Oral Daily Rushil Sherrye Payor, MD      . calcium acetate (PHOSLO) capsule 1,334 mg  1,334 mg Oral TID WC Rushil Sherrye Payor, MD      . calcium acetate (PHOSLO) capsule 667 mg  667 mg Oral TID WC Veatrice Bourbon, MD      . Derrill Memo ON 01/28/2015] ceFAZolin (ANCEF) IVPB 1 g/50 mL premix  1 g Intravenous On Call Ulyses Amor, PA-C      . furosemide (LASIX) 120 mg in dextrose 5 % 50 mL IVPB  120 mg Intravenous 3 times per day Roney Jaffe, MD   120 mg at 01/27/15 8182  . heparin injection 5,000 Units  5,000 Units Subcutaneous 3 times per day Leone Brand, MD   5,000 Units at 01/27/15 0525  . hydrALAZINE (APRESOLINE) injection 10 mg  10 mg Intravenous Q4H PRN Leone Brand, MD       . insulin aspart (novoLOG) injection 0-5 Units  0-5 Units Subcutaneous QHS Leone Brand, MD   0 Units at 01/26/15 2200  . insulin aspart (novoLOG) injection 0-9 Units  0-9 Units Subcutaneous TID WC Leone Brand, MD   0 Units at 01/26/15 [phone]  . ondansetron (ZOFRAN) tablet 4 mg  4 mg Oral Q6H PRN Leone Brand, MD       Or  . ondansetron [facility]) injection 4 mg  4 mg Intravenous Q6H PRN Leone Brand, MD      . sodium chloride 0.9 % injection 3 mL  3 mL Intravenous Q12H Leone Brand, MD   3 mL at 01/27/15 1051    Pt meds include: Statin :No Betablocker: No ASA: No Other anticoagulants/antiplatelets: none  Past Medical History  Diagnosis Date  . Coronary atherosclerosis     a. admx with [place] 11/13 => LHC 04/15/12: pLAD 50%, oD1 75% (small), pCFX 40% followed by mCFX 95%, pRCA 60%, mRCA 90%, EF 55-65%. PCI 04/15/12: DES to the [facility] and DES to the [facility].  . Mixed hyperlipidemia   .  Essential hypertension   . CKD (chronic kidney disease) stage 3, GFR 30-59 ml/min   . Carotid stenosis     a. Carotid US (05/2013):  1-39% bilateral ICA; brachial waveforms triphasic bilat; vertebrals with antegrade flow bilat; repeat 1 year  . Type II diabetes mellitus   . Daily headache   . Chronic high back pain     "where the lungs are located" (01/26/2015)    Past Surgical History  Procedure Laterality Date  . Left heart catheterization with coronary angiogram N/A 04/15/2012    Procedure: LEFT HEART CATHETERIZATION WITH CORONARY ANGIOGRAM;  Surgeon: Larey Dresser, MD;  Location: [facility] C[facility] LAB;  Service: Cardiovascular;  Laterality: N/A;  . Percutaneous coronary stent intervention (pci-s) N/A 04/15/2012    Procedure: PERCUTANEOUS CORONARY STENT INTERVENTION (PCI-S);  Surgeon: Sherren Mocha, MD;  Location: [facility] CATH LAB;  Service: Cardiovascular;  Laterality: N/A;  . Coronary angioplasty with stent placement  04/15/2012  . Cataract extraction w/ intraocular lens implant Left   . Eye surgery Right  2007    "blood behind eyeball"    Social History Social History  Substance Use Topics  . Smoking status: Former Smoker -- 0.25 packs/day for 10 years    Types: Cigarettes  . Smokeless tobacco: Never Used     Comment: "quit smoking cigarettes in the 1980's"  . Alcohol Use: Yes     Comment: "quit drinking in ~ 2010"    Family History Family History  Problem Relation Age of Onset  . Hypertension Mother     died 69 years old unknown reasons  . Other Father     died when Saturnino was 73 years old from "infection"  . Diabetes Brother   . Hypertension Sister     No Known Allergies   REVIEW OF SYSTEMS  General: [ ]  Weight loss, [ ]  Fever, [ ]  chills Neurologic: [ ]  Dizziness, [ ]  Blackouts, [ ]  Seizure [ ]  Stroke, [ ]  "Mini stroke", [ ]  Slurred speech, [ ]  Temporary blindness; [ ]  weakness in arms or legs, [ ]  Hoarseness [ ]  Dysphagia Cardiac: [ ]  Chest pain/pressure, [x ] Shortness of breath at rest [x ] Shortness of breath with exertion, [ ]  Atrial fibrillation or irregular heartbeat  Vascular: [ ]  Pain in legs with walking, [ ]  Pain in legs at rest, [ ]  Pain in legs at night,  [ ]  Non-healing ulcer, [ ]  Blood clot in vein/DVT,   Pulmonary: [ ]  Home oxygen, [ ]  Productive cough, [ ]  Coughing up blood, [ ]  Asthma,  [ ]  Wheezing [ ]  COPD Musculoskeletal:  [ ]  Arthritis, [ ]  Low back pain, [ ]  Joint pain Hematologic: [ ]  Easy Bruising, [ ]  Anemia; [ ]  Hepatitis Gastrointestinal: [ ]  Blood in stool, [ ]  Gastroesophageal Reflux/heartburn, Urinary: [x ] chronic Kidney disease, [ ]  on HD - [ ]  MWF or [ ]  TTHS, [ ]  Burning with urination, [ ]  Difficulty urinating Skin: [ ]  Rashes, [ ]  Wounds Psychological: [ ]  Anxiety, [ ]  Depression  Physical Examination Filed Vitals:   01/26/15 1542 01/26/15 2208 01/27/15 0500 01/27/15 1030  BP: 153/90 153/84 130/80 133/88  Pulse: 84 90 79 79  Temp: 98.1 F (36.7 C) 97.9 F (36.6 C) 97.9 F (36.6 C) 98.2 F (36.8 C)  TempSrc: Oral Oral  Oral Oral  Resp: 18 18 16 18   Height: 5\' 3"  (1.6 m)     Weight: 164 lb 10.9 oz (74.7 kg)  SpO2: 100% 94% 95% 99%   Body mass index is 29.18 kg/(m^2).  General:  WDWN in NAD Gait: Normal HENT: WNL Eyes: Pupils equal Pulmonary: normal non-labored breathing , without Rales,  Wheezing, positive crackles Cardiac: RRR, without  Murmurs, rubs or gallops; No carotid bruits Abdomen: soft, NT, no masses Skin: no rashes, ulcers noted;  no Gangrene , no cellulitis; no open wounds;   Vascular Exam/Pulses:palpable radial pulses bilaterally   Musculoskeletal: no muscle wasting or atrophy; no edema  Neurologic: A&O X 3; Appropriate Affect ;  SENSATION: normal; MOTOR FUNCTION: 5/5 Symmetric Speech is fluent/normal   Significant Diagnostic Studies: CBC Lab Results  Component Value Date   WBC 6.5 01/27/2015   HGB 7.8* 01/27/2015   HCT 23.2* 01/27/2015   MCV 87.9 01/27/2015   PLT 306 01/27/2015    BMET    Component Value Date/Time   NA 133* 01/27/2015 0540   K 3.9 01/27/2015 0540   CL 96* 01/27/2015 0540   CO2 23 01/27/2015 0540   GLUCOSE 86 01/27/2015 0540   BUN 63* 01/27/2015 0540   CREATININE 8.09* 01/27/2015 0540   CREATININE 1.87* 12/27/2012 0933   CALCIUM 6.4* 01/27/2015 0540   GFRNONAA 7* 01/27/2015 0540   GFRAA 8* 01/27/2015 0540   Estimated Creatinine Clearance: 9.3 mL/min (by C-G formula based on Cr of 8.09).  COAG Lab Results  Component Value Date   INR 0.95 04/15/2012     Non-Invasive Vascular Imaging:  Pending vein mapping  ASSESSMENT/PLAN:  Acute on CKD , ESRD Plan OR tomorrow for left AV fistula pending vein mapping results.  He is right hand dominant.  He agrees to proceeded.  The vascular lab was called and instructed to get vein mapping done ASAP.  NPO Past MN.  Laabs ordered for 4 am to check K+.   Laurence Slate [facility] 01/27/2015 12:53 PM

## 2015-01-27 NOTE — Progress Notes (Signed)
Subjective: Daughter at bedside. Feels that his throat soreness has improved. He reports improved urine output.   Objective: Vital signs in last 24 hours: Temp:  [97.9 F (36.6 C)-98.1 F (36.7 C)] 97.9 F (36.6 C) (08/24 0500) Pulse Rate:  [79-90] 79 (08/24 0500) Resp:  [13-28] 16 (08/24 0500) BP: (130-170)/(77-92) 130/80 mmHg (08/24 0500) SpO2:  [94 %-100 %] 95 % (08/24 0500) Weight:  [164 lb 10.9 oz (74.7 kg)] 164 lb 10.9 oz (74.7 kg) (08/23 1542) Weight change: 1 lb 10.9 oz (0.764 kg)  Intake/Output from previous day: 08/23 0701 - 08/24 0700 In: 302 [I.V.:3; IV Piggyback:299] Out: 800 [Urine:800] Intake/Output this shift:   General: middle aged Hispanic male, sitting at bedside HEENT: Conjunctivae are normal. Pupils are 4 mm, direct, consensual, near, no scleral icterus, oropharynx clear Cardiac: RRR Pulm: bilateral crackles present to the mid-lung, stable from yesterday Abd: soft, nontender, nondistended, BS present Ext: warm and well perfused, 1-2+ pitting edema R>L, hyperpigmentation noted just inferior to patella bilateral Neuro: moving all extremities, responding to questions appropriately   Lab Results:  Recent Labs  01/26/15 0916 01/27/15 0540  WBC 7.0 6.5  HGB 7.5* 7.8*  HCT 22.2* 23.2*  PLT 260 306   BMET:  Recent Labs  01/26/15 0916 01/27/15 0540  NA 137 133*  K 4.2 3.9  CL 102 96*  CO2 24 23  GLUCOSE 96 86  BUN 60* 63*  CREATININE 7.85* 8.09*  CALCIUM 6.3* 6.4*   Iron Studies:  Recent Labs  01/26/15 0916  IRON 14*  TIBC 241*  FERRITIN 194   Studies/Results: Dg Chest 2 View  01/25/2015   CLINICAL DATA:  Shortness of breath. Difficulty breathing since 2 weeks ago, slow progression. Audible wheezes.  EXAM: CHEST  2 VIEW  COMPARISON:  04/13/2012  FINDINGS: Mild cardiac enlargement without vascular congestion. Small bilateral pleural effusions greater on the left. Patchy nodular airspace disease throughout both lungs. Findings may represent  multifocal pneumonia. TB or atypical pneumonia could have this appearance. Primary or metastatic neoplasm could also have this appearance. Followup after treatment of acute process is recommended.  IMPRESSION: Diffuse patchy nodular airspace disease throughout both lungs. Bilateral pleural effusions. Findings likely to represent infectious process, either multifocal pneumonia or atypical pneumonia. Metastatic disease could also have this appearance. Followup after resolution of acute process is recommended.   Electronically Signed   By: Lucienne Capers M.D.   On: 01/25/2015 23:31   US Renal  01/26/2015   CLINICAL DATA:  Chronic kidney disease.  EXAM: RENAL / URINARY TRACT ULTRASOUND COMPLETE  COMPARISON:  None.  FINDINGS: Right Kidney:  Length: 10.7 cm. Diffusely increased parenchymal echogenicity. No mass or hydronephrosis visualized.  Left Kidney:  Length: 11.1 cm. Diffusely increased renal parenchymal echogenicity. No mass or hydronephrosis visualized.  Bladder:  Appears normal for degree of bladder distention.  Incidental: Bilateral pleural effusions.  IMPRESSION: 1. Increased renal parenchymal echogenicity consistent with chronic medical renal disease. No obstructive uropathy. 2. Bilateral pleural effusions, incidentally noted.   Electronically Signed   By: Jeb Levering M.D.   On: 01/26/2015 06:58    Scheduled: . amLODipine  5 mg Oral BID  . atorvastatin  40 mg Oral q1800  . furosemide  120 mg Intravenous 3 times per day  . heparin  5,000 Units Subcutaneous 3 times per day  . insulin aspart  0-5 Units Subcutaneous QHS  . insulin aspart  0-9 Units Subcutaneous TID WC  . sodium chloride  3 mL Intravenous Q12H  Assessment/Plan:  #1 Oliguric acute on chronic kidney injury: Likely ESRD given poorly controlled HTN, DM2. 800cc urine output. UA notable for proteinuria. Symptoms suggestive of uremia, and CXR findings and increased weight consistent with volume overload. Do not suspect infectious  etiology at this point and would consider taking off antibiotics. Renal US without atrophy bilaterally however interestingly enough will need to exclude other causes.   -Monitor strict I&O -Continue Lasix $RemoveBeforeD'120mg'nffCOwxChAXLRl$  q8h and consult IR for HD cath placement -Follow-up hepatitis panel, C3, C4 and follow-up HIV -Follow electrolytes tomorrow  #2 Normochromic anemia: Low iron, normal ferritin, low TIBC suggestive of anemia of chronic disease. Hb 9 on admission, down from 12 a few years ago.   #3 Metabolic bone disorder: Corrected calcium 7.6, elevated P today. -Follow-up PTH, Vitamin D -Consider empirically treating with calcitriol & PhosLo  #4 HTN: Systolic BP improved on amlodipine $RemoveBefor'5mg'EWrDHhwbEcKI$  twice daily  #6 DM2: A1c 7.9, 2014. SSI per primary.  #7 CAD s/p DES x 2  #8 Diastolic CHF     LOS: 1 day   Posey Pronto, [place] 01/27/2015,7:55 AM  I have seen and examined this patient and agree with plan as outlined by Dr. Posey Pronto.  Pt with longstanding, poorly controlled HTN and DM with progressive CKD likely at ESRD.  Will start phosphate binders and vitamin D therapy.  Will also consult VVS for vascular access (both tunneled HD cath as well as AVF/AVG following vein mapping).  Discussed with patient and his family through Dr. Posey Pronto translating about dialysis and the need for vascular access.  He appeared to understand as well as his family.  He will need to establish a PCP and emergency medicaid/medicare with the assistance of the SW/CM. Elise Gladden A,MD 01/27/2015 11:21 AM

## 2015-01-27 NOTE — Progress Notes (Signed)
Family Medicine Teaching Service Daily Progress Note Intern Pager: [phone]  Patient name: Stephen Lane Medical record number: [id number] Date of birth: 1960/07/04 Age: 54 y.o. Gender: male  Primary Care Provider: Phill Myron, MD Consultants: Cardiology, Renal Code Status: Full  Pt Overview and Major Events to Date:  8/23: admitted for SOB, chest pain  Assessment and Plan:  Stephen Lane is a 54 y.o. male presenting with shortness of breath x 2 weeks. PMH is significant for CKD, CAD s/p stenting 2013, T2DM, HTN, HLD  # Pulmonary- SOB for last 2-3 weeks: Improved sensation of SOB with diureses, likely secondary to renal and cardiac dysfunctio - s/p ceftriaxone and azithromycin, d/ced as unlikely infectious picture - supplemental o2 as needed  # HTN: Improved  - will restart amlodipine 5mg   - hydralazine IV PRN for SBP >180, DBP >110  Cardiac- Echo shows G2 diastolic dysfucntion with focal hypokinesis -  Will touch base with cards, appreciate recs   # Oliguric/AKI on CKD: worsening Cr to 8.09 8/24, renal US shows evidence of chronic renal disease - continue Lasix per renal - Renal following- > plan for dialysis with HD cath placement - consult nephrology in AM -   #Metabolic Bone disorder- elevated phoshorous to 6.6, corrected calcium 7.6  - 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 Has not had a colonoscopy. Would favor ACD but can't rule out other etiology  - FOBT x 3 - Iron/TIBC/ferritin  # T2DM: last A1c in 2014 was 7.9 - CBG monitoring qAC qHS - SSI - repeat A1c  # HLD:  - switch pravastatin to atorvastatin - lipid panel  FEN/GI: diet carb mod  Prophylaxis: heparin sq  Disposition: Pending clinical improvement  Subjective:  Feels improved SOB, chest pain and abd  pain  Objective: Temp:  [97.9 F (36.6 C)-98.1 F (36.7 C)] 97.9 F (36.6 C) (08/24 0500) Pulse Rate:  [79-90] 79 (08/24 0500) Resp:  [16-28] 16 (08/24 0500) BP: (130-170)/(77-92) 130/80 mmHg (08/24 0500) SpO2:  [94 %-100 %] 95 % (08/24 0500) Weight:  [164 lb 10.9 oz (74.7 kg)] 164 lb 10.9 oz (74.7 kg) (08/23 1542) Physical Exam: General: NAD, sitting up in bed Cardiovascular: RRR Respiratory: CTAB, mild scattered crackles, normal WOB Abdomen: soft, mild LLQ pain, no r/g no distended Extremities: 1-2+ LE edema, worse on right, non tender LE  Laboratory:  Recent Labs Lab 01/25/15 2236 01/26/15 0916 01/27/15 0540  WBC 8.1 7.0 6.5  HGB 8.6* 7.5* 7.8*  HCT 25.1* 22.2* 23.2*  PLT 305 260 306    Recent Labs Lab 01/25/15 2236 01/26/15 0916 01/27/15 0540  NA 136 137 133*  K 4.2 4.2 3.9  CL 100* 102 96*  CO2 22 24 23   BUN 63* 60* 63*  CREATININE 7.92* 7.85* 8.09*  CALCIUM 6.6* 6.3* 6.4*  PROT  --  6.0*  --   BILITOT  --  0.5  --   ALKPHOS  --  155*  --   ALT  --  15*  --   AST  --  12*  --   GLUCOSE 129* 96 86      Imaging/Diagnostic Tests: Renal US 1. Increased renal parenchymal echogenicity consistent with chronic medical renal disease. No obstructive uropathy. 2. Bilateral pleural effusions, incidentally noted  CXR Diffuse patchy nodular airspace disease throughout both lungs. Bilateral pleural effusions.  Veatrice Bourbon, MD 01/27/2015, 9:39 AM PGY-2, [place] Intern pager: [phone], text pages welcome

## 2015-01-28 ENCOUNTER — Other Ambulatory Visit: Payer: Self-pay

## 2015-01-28 ENCOUNTER — Inpatient Hospital Stay (HOSPITAL_COMMUNITY): Payer: Medicaid Other | Admitting: Anesthesiology

## 2015-01-28 ENCOUNTER — Inpatient Hospital Stay (HOSPITAL_COMMUNITY): Payer: Medicaid Other

## 2015-01-28 ENCOUNTER — Encounter (HOSPITAL_COMMUNITY)
Admission: EM | Disposition: A | Discharge status: Home / Self Care | Payer: Self-pay | Source: Home / Self Care | Attending: Family Medicine

## 2015-01-28 ENCOUNTER — Telehealth: Payer: Self-pay | Admitting: Vascular Surgery

## 2015-01-28 ENCOUNTER — Encounter (HOSPITAL_COMMUNITY): Payer: Self-pay | Admitting: Anesthesiology

## 2015-01-28 DIAGNOSIS — Z48812 Encounter for surgical aftercare following surgery on the circulatory system: Secondary | ICD-10-CM

## 2015-01-28 DIAGNOSIS — N186 End stage renal disease: Secondary | ICD-10-CM

## 2015-01-28 DIAGNOSIS — E1122 Type 2 diabetes mellitus with diabetic chronic kidney disease: Secondary | ICD-10-CM | POA: Insufficient documentation

## 2015-01-28 DIAGNOSIS — Z992 Dependence on renal dialysis: Secondary | ICD-10-CM

## 2015-01-28 HISTORY — PX: AV FISTULA PLACEMENT: SHX1204

## 2015-01-28 HISTORY — PX: INSERTION OF DIALYSIS CATHETER: SHX1324

## 2015-01-28 LAB — GLUCOSE, CAPILLARY
GLUCOSE-CAPILLARY: 85 mg/dL (ref 65–99)
GLUCOSE-CAPILLARY: 137 mg/dL — AB (ref 65–99)
Glucose-Capillary: 96 mg/dL (ref 65–99)
Glucose-Capillary: 83 mg/dL (ref 65–99)
Glucose-Capillary: 82 mg/dL (ref 65–99)
Glucose-Capillary: 132 mg/dL — ABNORMAL HIGH (ref 65–99)

## 2015-01-28 LAB — BASIC METABOLIC PANEL
Anion gap: 15 (ref 5–15)
BUN: 64 mg/dL — AB (ref 6–20)
CALCIUM: 6.7 mg/dL — AB (ref 8.9–10.3)
CO2: 24 mmol/L (ref 22–32)
CREATININE: 8.56 mg/dL — AB (ref 0.61–1.24)
Chloride: 97 mmol/L — ABNORMAL LOW (ref 101–111)
GFR calc non Af Amer: 6 mL/min — ABNORMAL LOW (ref >60)
GFR, EST AFRICAN AMERICAN: 7 mL/min — AB (ref >60)
GLUCOSE: 92 mg/dL (ref 65–99)
Potassium: 4.1 mmol/L (ref 3.5–5.1)
Sodium: 136 mmol/L (ref 135–145)

## 2015-01-28 LAB — C4 COMPLEMENT: COMPLEMENT C4, BODY FLUID: 25 mg/dL (ref 14–44)

## 2015-01-28 LAB — C3 COMPLEMENT: C3 COMPLEMENT: 106 mg/dL (ref 82–167)

## 2015-01-28 LAB — RENAL FUNCTION PANEL
ANION GAP: 16 — AB (ref 5–15)
Albumin: 2.4 g/dL — ABNORMAL LOW (ref 3.5–5.0)
Albumin: 2.4 g/dL — ABNORMAL LOW (ref 3.5–5.0)
Anion gap: 14 (ref 5–15)
BUN: 64 mg/dL — AB (ref 6–20)
BUN: 67 mg/dL — ABNORMAL HIGH (ref 6–20)
CALCIUM: 6.4 mg/dL — AB (ref 8.9–10.3)
CHLORIDE: 96 mmol/L — AB (ref 101–111)
CO2: 24 mmol/L (ref 22–32)
CO2: 24 mmol/L (ref 22–32)
CREATININE: 8.61 mg/dL — AB (ref 0.61–1.24)
Calcium: 6.7 mg/dL — ABNORMAL LOW (ref 8.9–10.3)
Chloride: 94 mmol/L — ABNORMAL LOW (ref 101–111)
Creatinine, Ser: 8.55 mg/dL — ABNORMAL HIGH (ref 0.61–1.24)
GFR calc Af Amer: 7 mL/min — ABNORMAL LOW (ref >60)
GFR calc non Af Amer: 6 mL/min — ABNORMAL LOW (ref >60)
GFR, EST AFRICAN AMERICAN: 7 mL/min — AB (ref >60)
GFR, EST NON AFRICAN AMERICAN: 6 mL/min — AB (ref >60)
GLUCOSE: 93 mg/dL (ref 65–99)
Glucose, Bld: 116 mg/dL — ABNORMAL HIGH (ref 65–99)
POTASSIUM: 4.1 mmol/L (ref 3.5–5.1)
Phosphorus: 7.9 mg/dL — ABNORMAL HIGH (ref 2.5–4.6)
Phosphorus: 8 mg/dL — ABNORMAL HIGH (ref 2.5–4.6)
Potassium: 4.4 mmol/L (ref 3.5–5.1)
SODIUM: 132 mmol/L — AB (ref 135–145)
Sodium: 136 mmol/L (ref 135–145)

## 2015-01-28 LAB — HEPATITIS PANEL, ACUTE
HCV Ab: <0.1 s/co ratio (ref 0.0–0.9)
HEP B C IGM: NEGATIVE
Hep A IgM: NEGATIVE
Hepatitis B Surface Ag: NEGATIVE

## 2015-01-28 LAB — CBC
HCT: 22.1 % — ABNORMAL LOW (ref 39.0–52.0)
HCT: 21.9 % — ABNORMAL LOW (ref 39.0–52.0)
Hemoglobin: 7.5 g/dL — ABNORMAL LOW (ref 13.0–17.0)
Hemoglobin: 7.3 g/dL — ABNORMAL LOW (ref 13.0–17.0)
MCH: 30.2 pg (ref 26.0–34.0)
MCH: 29.2 pg (ref 26.0–34.0)
MCHC: 33.9 g/dL (ref 30.0–36.0)
MCHC: 33.3 g/dL (ref 30.0–36.0)
MCV: 89.1 fL (ref 78.0–100.0)
MCV: 87.6 fL (ref 78.0–100.0)
PLATELETS: 269 10*3/uL (ref 150–400)
PLATELETS: 281 10*3/uL (ref 150–400)
RBC: 2.48 MIL/uL — AB (ref 4.22–5.81)
RBC: 2.5 MIL/uL — AB (ref 4.22–5.81)
RDW: 14 % (ref 11.5–15.5)
RDW: 13.8 % (ref 11.5–15.5)
WBC: 6.2 10*3/uL (ref 4.0–10.5)
WBC: 6.4 10*3/uL (ref 4.0–10.5)

## 2015-01-28 LAB — SEDIMENTATION RATE: Sed Rate: >140 mm/hr — ABNORMAL HIGH (ref 0–16)

## 2015-01-28 LAB — SURGICAL PCR SCREEN
MRSA, PCR: NEGATIVE
Staphylococcus aureus: NEGATIVE

## 2015-01-28 LAB — PROTIME-INR
INR: 1.11 (ref 0.00–1.49)
PROTHROMBIN TIME: 14.5 s (ref 11.6–15.2)

## 2015-01-28 SURGERY — ARTERIOVENOUS (AV) FISTULA CREATION
Anesthesia: Monitor Anesthesia Care | Site: Chest

## 2015-01-28 MED ORDER — HEPARIN SODIUM (PORCINE) 1000 UNIT/ML IJ SOLN
INTRAMUSCULAR | Status: DC | PRN
  Administered 2015-01-28: 4.6 mL via INTRAVENOUS

## 2015-01-28 MED ORDER — PROPOFOL 10 MG/ML IV BOLUS
INTRAVENOUS | Status: AC
  Filled 2015-01-28: qty 20

## 2015-01-28 MED ORDER — FENTANYL CITRATE (PF) 100 MCG/2ML IJ SOLN: 25.0000 ug | INTRAMUSCULAR | Status: DC | PRN

## 2015-01-28 MED ORDER — LIDOCAINE HCL (PF) 1 % IJ SOLN
INTRAMUSCULAR | Status: AC
  Filled 2015-01-28: qty 30

## 2015-01-28 MED ORDER — NEPRO/CARBSTEADY PO LIQD: 237.0000 mL | ORAL | Status: DC | PRN

## 2015-01-28 MED ORDER — SODIUM CHLORIDE 0.9 % IR SOLN
Status: DC | PRN
  Administered 2015-01-28: 500 mL

## 2015-01-28 MED ORDER — DEXAMETHASONE SODIUM PHOSPHATE 4 MG/ML IJ SOLN
INTRAMUSCULAR | Status: AC
  Filled 2015-01-28: qty 2

## 2015-01-28 MED ORDER — SODIUM CHLORIDE 0.9 % IV SOLN
INTRAVENOUS | Status: DC | PRN
  Administered 2015-01-28: 07:00:00 via INTRAVENOUS

## 2015-01-28 MED ORDER — MIDAZOLAM HCL 2 MG/2ML IJ SOLN
INTRAMUSCULAR | Status: AC
  Filled 2015-01-28: qty 4

## 2015-01-28 MED ORDER — LIDOCAINE HCL (PF) 1 % IJ SOLN
INTRAMUSCULAR | Status: DC | PRN
  Administered 2015-01-28: 13 mL

## 2015-01-28 MED ORDER — ALTEPLASE 2 MG IJ SOLR
2.0000 mg | Freq: Once | INTRAMUSCULAR | Status: DC | PRN
Start: 2015-01-28 — End: 2015-01-29
  Filled 2015-01-28: qty 2

## 2015-01-28 MED ORDER — OXYCODONE-ACETAMINOPHEN 5-325 MG PO TABS: 1.0000 | ORAL_TABLET | Freq: Four times a day (QID) | ORAL | Status: DC | PRN

## 2015-01-28 MED ORDER — LIDOCAINE HCL (PF) 1 % IJ SOLN: 5.0000 mL | INTRAMUSCULAR | Status: DC | PRN

## 2015-01-28 MED ORDER — HEPARIN SODIUM (PORCINE) 1000 UNIT/ML DIALYSIS: 20.0000 [IU]/kg | INTRAMUSCULAR | Status: DC | PRN

## 2015-01-28 MED ORDER — LIDOCAINE HCL (CARDIAC) 20 MG/ML IV SOLN
INTRAVENOUS | Status: AC
  Filled 2015-01-28: qty 5

## 2015-01-28 MED ORDER — ONDANSETRON HCL 4 MG/2ML IJ SOLN
INTRAMUSCULAR | Status: AC
  Filled 2015-01-28: qty 2

## 2015-01-28 MED ORDER — MORPHINE SULFATE (PF) 2 MG/ML IV SOLN
2.0000 mg | INTRAVENOUS | Status: DC | PRN
  Administered 2015-01-28 – 2015-01-29 (×2): 2 mg via INTRAVENOUS
  Filled 2015-01-28 (×2): qty 1

## 2015-01-28 MED ORDER — FENTANYL CITRATE (PF) 100 MCG/2ML IJ SOLN
INTRAMUSCULAR | Status: DC | PRN
  Administered 2015-01-28: 25 ug via INTRAVENOUS
  Administered 2015-01-28: 50 ug via INTRAVENOUS
  Administered 2015-01-28: 25 ug via INTRAVENOUS

## 2015-01-28 MED ORDER — 0.9 % SODIUM CHLORIDE (POUR BTL) OPTIME
TOPICAL | Status: DC | PRN
  Administered 2015-01-28: 1000 mL

## 2015-01-28 MED ORDER — LIDOCAINE-EPINEPHRINE (PF) 1 %-1:200000 IJ SOLN
INTRAMUSCULAR | Status: DC | PRN
  Administered 2015-01-28: 10 mL

## 2015-01-28 MED ORDER — HEPARIN SODIUM (PORCINE) 1000 UNIT/ML IJ SOLN
INTRAMUSCULAR | Status: AC
  Filled 2015-01-28: qty 1

## 2015-01-28 MED ORDER — SODIUM CHLORIDE 0.9 % IV SOLN: 100.0000 mL | INTRAVENOUS | Status: DC | PRN

## 2015-01-28 MED ORDER — ONDANSETRON HCL 4 MG/2ML IJ SOLN
INTRAMUSCULAR | Status: DC | PRN
Start: 2015-01-28 — End: 2015-01-28
  Administered 2015-01-28: 4 mg via INTRAVENOUS

## 2015-01-28 MED ORDER — PENTAFLUOROPROP-TETRAFLUOROETH EX AERO: 1.0000 "application " | INHALATION_SPRAY | CUTANEOUS | Status: DC | PRN

## 2015-01-28 MED ORDER — HEPARIN SODIUM (PORCINE) 1000 UNIT/ML DIALYSIS: 1000.0000 [IU] | INTRAMUSCULAR | Status: DC | PRN

## 2015-01-28 MED ORDER — LIDOCAINE-PRILOCAINE 2.5-2.5 % EX CREA
1.0000 "application " | TOPICAL_CREAM | CUTANEOUS | Status: DC | PRN
  Filled 2015-01-28: qty 5

## 2015-01-28 MED ORDER — FENTANYL CITRATE (PF) 250 MCG/5ML IJ SOLN
INTRAMUSCULAR | Status: AC
  Filled 2015-01-28: qty 5

## 2015-01-28 MED ORDER — MIDAZOLAM HCL 5 MG/5ML IJ SOLN
INTRAMUSCULAR | Status: DC | PRN
  Administered 2015-01-28: 2 mg via INTRAVENOUS

## 2015-01-28 MED ORDER — CEFAZOLIN SODIUM-DEXTROSE 2-3 GM-% IV SOLR
INTRAVENOUS | Status: DC | PRN
  Administered 2015-01-28: 2 g via INTRAVENOUS

## 2015-01-28 MED ORDER — OXYCODONE HCL 5 MG PO TABS: 5.0000 mg | ORAL_TABLET | Freq: Once | ORAL | Status: DC | PRN

## 2015-01-28 MED ORDER — ONDANSETRON HCL 4 MG/2ML IJ SOLN
4.0000 mg | Freq: Four times a day (QID) | INTRAMUSCULAR | Status: DC | PRN
Start: 2015-01-28 — End: 2015-01-28

## 2015-01-28 MED ORDER — LIDOCAINE-EPINEPHRINE (PF) 1 %-1:200000 IJ SOLN
INTRAMUSCULAR | Status: AC
  Filled 2015-01-28: qty 30

## 2015-01-28 MED ORDER — PROPOFOL INFUSION 10 MG/ML OPTIME
INTRAVENOUS | Status: DC | PRN
  Administered 2015-01-28: 75 ug/kg/min via INTRAVENOUS

## 2015-01-28 MED ORDER — OXYCODONE HCL 5 MG/5ML PO SOLN: 5.0000 mg | Freq: Once | ORAL | Status: DC | PRN

## 2015-01-28 MED ORDER — LIDOCAINE HCL (CARDIAC) 20 MG/ML IV SOLN
INTRAVENOUS | Status: DC | PRN
  Administered 2015-01-28: 40 mg via INTRAVENOUS

## 2015-01-28 SURGICAL SUPPLY — 60 items
ARMBAND PINK RESTRICT EXTREMIT (MISCELLANEOUS) ×4 IMPLANT
BAG DECANTER FOR FLEXI CONT (MISCELLANEOUS) ×4 IMPLANT
BIOPATCH RED 1 DISK 7.0 (GAUZE/BANDAGES/DRESSINGS) ×3 IMPLANT
BIOPATCH RED 1IN DISK 7.0MM (GAUZE/BANDAGES/DRESSINGS) ×1
CANISTER SUCTION 2500CC (MISCELLANEOUS) ×4 IMPLANT
CATH CANNON HEMO 15F 50CM (CATHETERS) IMPLANT
CATH CANNON HEMO 15FR 19 (HEMODIALYSIS SUPPLIES) IMPLANT
CATH CANNON HEMO 15FR 23CM (HEMODIALYSIS SUPPLIES) ×2 IMPLANT
CATH CANNON HEMO 15FR 31CM (HEMODIALYSIS SUPPLIES) IMPLANT
CATH CANNON HEMO 15FR 32 (HEMODIALYSIS SUPPLIES) IMPLANT
CATH CANNON HEMO 15FR 32CM (HEMODIALYSIS SUPPLIES) IMPLANT
CLIP TI MEDIUM 6 (CLIP) ×4 IMPLANT
CLIP TI WIDE RED SMALL 6 (CLIP) ×4 IMPLANT
COVER PROBE W GEL 5X96 (DRAPES) ×2 IMPLANT
COVER SURGICAL LIGHT HANDLE (MISCELLANEOUS) ×4 IMPLANT
DECANTER SPIKE VIAL GLASS SM (MISCELLANEOUS) ×2 IMPLANT
DRAIN PENROSE 1/4X12 LTX STRL (WOUND CARE) ×2 IMPLANT
DRAPE C-ARM 42X72 X-RAY (DRAPES) ×4 IMPLANT
DRAPE CHEST BREAST 15X10 FENES (DRAPES) ×4 IMPLANT
ELECT REM PT RETURN 9FT ADLT (ELECTROSURGICAL) ×4
ELECTRODE REM PT RTRN 9FT ADLT (ELECTROSURGICAL) ×2 IMPLANT
GAUZE SPONGE 2X2 8PLY STRL LF (GAUZE/BANDAGES/DRESSINGS) ×2 IMPLANT
GAUZE SPONGE 4X4 16PLY XRAY LF (GAUZE/BANDAGES/DRESSINGS) ×4 IMPLANT
GEL ULTRASOUND 20GR AQUASONIC (MISCELLANEOUS) IMPLANT
GLOVE BIO SURGEON STRL SZ 6.5 (GLOVE) ×1 IMPLANT
GLOVE BIO SURGEONS STRL SZ 6.5 (GLOVE) ×1
GLOVE BIOGEL PI IND STRL 6.5 (GLOVE) IMPLANT
GLOVE BIOGEL PI INDICATOR 6.5 (GLOVE) ×10
GLOVE ECLIPSE 6.5 STRL STRAW (GLOVE) ×4 IMPLANT
GLOVE SS BIOGEL STRL SZ 7 (GLOVE) ×2 IMPLANT
GLOVE SUPERSENSE BIOGEL SZ 7 (GLOVE) ×2
GLOVE SURG SS PI 7.0 STRL IVOR (GLOVE) ×2 IMPLANT
GOWN STRL REUS W/ TWL LRG LVL3 (GOWN DISPOSABLE) ×6 IMPLANT
GOWN STRL REUS W/TWL LRG LVL3 (GOWN DISPOSABLE) ×12
KIT BASIN OR (CUSTOM PROCEDURE TRAY) ×4 IMPLANT
KIT ROOM TURNOVER OR (KITS) ×4 IMPLANT
LIQUID BAND (GAUZE/BANDAGES/DRESSINGS) ×4 IMPLANT
NDL 18GX1X1/2 (RX/OR ONLY) (NEEDLE) ×2 IMPLANT
NDL HYPO 25GX1X1/2 BEV (NEEDLE) ×2 IMPLANT
NEEDLE 18GX1X1/2 (RX/OR ONLY) (NEEDLE) ×4 IMPLANT
NEEDLE 22X1 1/2 (OR ONLY) (NEEDLE) ×4 IMPLANT
NEEDLE HYPO 25GX1X1/2 BEV (NEEDLE) ×4 IMPLANT
NS IRRIG 1000ML POUR BTL (IV SOLUTION) ×4 IMPLANT
PACK CV ACCESS (CUSTOM PROCEDURE TRAY) ×2 IMPLANT
PACK SURGICAL SETUP 50X90 (CUSTOM PROCEDURE TRAY) ×2 IMPLANT
PAD ARMBOARD 7.5X6 YLW CONV (MISCELLANEOUS) ×8 IMPLANT
SOAP 2 % CHG 4 OZ (WOUND CARE) ×4 IMPLANT
SPONGE GAUZE 2X2 STER 10/PKG (GAUZE/BANDAGES/DRESSINGS) ×2
SUT ETHILON 3 0 PS 1 (SUTURE) ×4 IMPLANT
SUT PROLENE 6 0 BV (SUTURE) ×4 IMPLANT
SUT VIC AB 3-0 SH 27 (SUTURE) ×4
SUT VIC AB 3-0 SH 27X BRD (SUTURE) ×2 IMPLANT
SUT VICRYL 4-0 PS2 18IN ABS (SUTURE) ×4 IMPLANT
SYR 20CC LL (SYRINGE) ×4 IMPLANT
SYR 5ML LL (SYRINGE) ×8 IMPLANT
SYR CONTROL 10ML LL (SYRINGE) ×4 IMPLANT
SYRINGE 10CC LL (SYRINGE) ×4 IMPLANT
TAPE CLOTH SURG 4X10 WHT LF (GAUZE/BANDAGES/DRESSINGS) ×2 IMPLANT
UNDERPAD 30X30 INCONTINENT (UNDERPADS AND DIAPERS) ×4 IMPLANT
WATER STERILE IRR 1000ML POUR (IV SOLUTION) ×4 IMPLANT

## 2015-01-28 NOTE — Care Management Note (Signed)
Case Management Note  Patient Details  Name: Stephen Lane MRN: [id number] Date of Birth: 1960/10/29  Subjective/Objective:        CM following for progression and d/c planning.            Action/Plan: 01/28/15 referral for PCP and Medicaid application , however pt is uninsured so CM will arrange an appointment at [place] when d/c date is known. We are unable to request Medicaid application until need for ongoing outpatient HD is established and pt is deemed ESRD and needs to be CLIPPED.  Expected Discharge Date:                  Expected Discharge Plan:  Home/Self Care  In-House Referral:  Clinical Social Work  Discharge planning Services  CM Consult  Post Acute Care Choice:    Choice offered to:     DME Arranged:    DME Agency:     HH Arranged:    HH Agency:     Status of Service:  In process, will continue to follow  Medicare Important Message Given:    Date Medicare IM Given:    Medicare IM give by:    Date Additional Medicare IM Given:    Additional Medicare Important Message give by:     If discussed at Media of Stay Meetings, dates discussed:    Additional Comments:  Adron Bene, RN 01/28/2015, 10:23 AM

## 2015-01-28 NOTE — Telephone Encounter (Signed)
-----   Message from Denman George, RN sent at 01/28/2015 10:19 AM EDT ----- Regarding: Zigmund Daniel log ; also needs 6 wk. (L) arm access duplex and appt. with [facility]   ----- Message -----    From: Alvia Grove, PA-C    Sent: 01/28/2015   8:50 AM      To: Vvs Charge Pool  S/p left brachiocephalic AVF 11/08/28  F/u with Dr. Kellie Simmering in 6 weeks with duplex  Thanks Maudie Mercury

## 2015-01-28 NOTE — Progress Notes (Signed)
Family Medicine Teaching Service Daily Progress Note Intern Pager: [phone]  Patient name: Stephen Lane Medical record number: [id number] Date of birth: [birth year] Age: 54 y.o. Gender: male  Primary Care Provider: Phill Myron, MD Consultants: Cardiology, Renal Code Status: Full  Pt Overview and Major Events to Date:  8/23: admitted for SOB, chest pain  Assessment and Plan:  Stephen Lane is a 54 y.o. male presenting with shortness of breath x 2 weeks. PMH is significant for CKD, CAD s/p stenting 2013, T2DM, HTN, HLD  # Pulmonary- SOB for last 2-3 weeks: Improving sensation of SOB, likely secondary to renal failure and volume overload - s/p ceftriaxone and azithromycin x1D, d/ced as unlikely infectious picture - Repeat CXR after dialysis to further assess pulm infiltrates. Liklely pulmonary edema but if remains even after taking off fluid with dialysis concerning for other contributing etiologies - supplemental O2 as needed  # HTN: Improved  - cont amlodipine 16m  - hydralazine IV PRN for SBP >180, DBP >110  Cardiac- Echo shows G2 diastolic dysfucntion with focal hypokinesis -  no need for inpatient intervention. Ok to f/u as outpt  # Oliguric/AKI on CKD: worsening Cr to 8.09 8/24, renal [place]ashows evidence of chronic renal disease-  - Renal following- > HD placement and dialysis today 8/25 - Etiology of Renal disease likely greatly contributed to by uncontrolled DM and HTN. 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 Went to the OR this AM for AV fistula placement. Feels pain controlled with oral pain meds. Asking to eat  Pt reports that she the last two years he did not take his medicines because he took a new job with less hours. He also felt his eye sight was worsening and wanted to take a new job that would be better for his eyes[place] When he came back he never got them refilled. . As he was working less hours, he felt he could not afford to buy his medicines  Objective: Temp:  [97.7 F (36.5 C)-98.6 F (37 C)] 98.1 F (36.7 C) (08/25 0409) Pulse Rate:  [79-84] 83 (08/25 0409) Resp:  [18-20] 18 (08/25 0409) BP: (108-144)/(63-93) 108/63 mmHg (08/25 0409) SpO2:  [97 %-100 %] 100 % (08/25 0409) Weight:  [163 lb 2.3 oz (74 kg)] 163 lb 2.3 oz (74 kg) (08/24 2112) Physical Exam: General: NAD, lying in bed Cardiovascular: RRR Respiratory: CTAB, mild scattered crackles, normal WOB Abdomen: soft, non tender, no r/g no distended Extremities: 2+ LE edema,  Non tender  Laboratory:  Recent Labs Lab 01/25/15 2236 01/26/15 0916 01/27/15 0540  WBC 8.1 7.0 6.5  HGB 8.6* 7.5* 7.8*  HCT 25.1* 22.2* 23.2*  PLT 305 260 306    Recent Labs Lab 01/25/15 2236 01/26/15 0916 01/27/15 0540  NA 136 137 133*  K 4.2 4.2 3.9  CL 100* 102 96*  CO2 '22 24 23  ' BUN 63* 60* 63*  CREATININE 7.92* 7.85* 8.09*  CALCIUM 6.6* 6.3* 6.4*  PROT  --  6.0*  --   BILITOT  --  0.5  --   ALKPHOS  --  155*  --   ALT  --  15*  --   AST  --  12*  --   GLUCOSE 129* 96 86      Imaging/Diagnostic Tests: Renal US 1. Increased renal parenchymal  echogenicity consistent with chronic medical renal disease. No obstructive uropathy. 2. Bilateral pleural effusions, incidentally noted  CXR Diffuse patchy nodular airspace disease throughout both lungs. Bilateral pleural effusions.    Veatrice Bourbon, MD 01/28/2015, 8:46 AM PGY-2, [place] Intern pager: [phone], text pages welcome

## 2015-01-28 NOTE — Anesthesia Preprocedure Evaluation (Signed)
Anesthesia Evaluation  Patient identified by MRN, date of birth, ID band Patient awake    Reviewed: Allergy & Precautions, NPO status , Patient's Chart, lab work & pertinent test results  Airway Mallampati: II   Neck ROM: full    Dental   Pulmonary former smoker,  breath sounds clear to auscultation        Cardiovascular hypertension, + CAD, + Cardiac Stents and + Peripheral Vascular Disease Rhythm:regular Rate:Normal     Neuro/Psych  Headaches,    GI/Hepatic GERD-  ,  Endo/Other  diabetes, Type 2  Renal/GU ESRFRenal disease     Musculoskeletal   Abdominal   Peds  Hematology   Anesthesia Other Findings   Reproductive/Obstetrics                             Anesthesia Physical Anesthesia Plan  ASA: III  Anesthesia Plan: MAC   Post-op Pain Management:    Induction: Intravenous  Airway Management Planned: Simple Face Mask  Additional Equipment:   Intra-op Plan:   Post-operative Plan:   Informed Consent: I have reviewed the patients History and Physical, chart, labs and discussed the procedure including the risks, benefits and alternatives for the proposed anesthesia with the patient or authorized representative who has indicated his/her understanding and acceptance.     Plan Discussed with: CRNA, Anesthesiologist and Surgeon  Anesthesia Plan Comments:         Anesthesia Quick Evaluation

## 2015-01-28 NOTE — Telephone Encounter (Signed)
LM for pt re appt, dpm °

## 2015-01-28 NOTE — Progress Notes (Signed)
Patient ID: Stephen Lane, male   DOB: 11/04/1960, 54 y.o.   MRN: [id number] S:feels a little better today and was walking around O:BP 121/81 mmHg  Pulse 82  Temp(Src) 97.8 F (36.6 C) (Oral)  Resp 20  Ht $R'5\' 3"'rm$  (1.6 m)  Wt 74 kg (163 lb 2.3 oz)  BMI 28.91 kg/m2  SpO2 99%  Intake/Output Summary (Last 24 hours) at 01/28/15 1240 Last data filed at 01/28/15 0846  Gross per 24 hour  Intake   1536 ml  Output     30 ml  Net   1506 ml   Intake/Output: I/O last 3 completed shifts: In: 4132 [P.O.:1302; IV Piggyback:423] Out: 800 [Urine:800]  Intake/Output this shift:  Total I/O In: 350 [I.V.:350] Out: 30 [Blood:30] Weight change: -0.7 kg (-1 lb 8.7 oz) Gen:WD WN HM in NAD CVS:no rub Resp:decreased BS at bases GMW:NUUVOZ Ext:1-2+ pretibial edema, LUE AVF +T/B   Recent Labs Lab 01/25/15 2236 01/26/15 0916 01/26/15 1417 01/27/15 0540  NA 136 137  --  133*  K 4.2 4.2  --  3.9  CL 100* 102  --  96*  CO2 22 24  --  23  GLUCOSE 129* 96  --  86  BUN 63* 60*  --  63*  CREATININE 7.92* 7.85*  --  8.09*  ALBUMIN  --  2.4*  --   --   CALCIUM 6.6* 6.3*  --  6.4*  PHOS  --   --  6.6*  --   AST  --  12*  --   --   ALT  --  15*  --   --    Liver Function Tests:  Recent Labs Lab 01/26/15 0916  AST 12*  ALT 15*  ALKPHOS 155*  BILITOT 0.5  PROT 6.0*  ALBUMIN 2.4*   No results for input(s): LIPASE, AMYLASE in the last 168 hours. No results for input(s): AMMONIA in the last 168 hours. CBC:  Recent Labs Lab 01/25/15 2236 01/26/15 0916 01/27/15 0540  WBC 8.1 7.0 6.5  HGB 8.6* 7.5* 7.8*  HCT 25.1* 22.2* 23.2*  MCV 88.4 88.1 87.9  PLT 305 260 306   Cardiac Enzymes:  Recent Labs Lab 01/26/15 0916  TROPONINI 0.03   CBG:  Recent Labs Lab 01/27/15 2109 01/28/15 0556 01/28/15 0717 01/28/15 0913 01/28/15 1025  GLUCAP 122* 96 83 85 82    Iron Studies:  Recent Labs  01/26/15 0916  IRON 14*  TIBC 241*  FERRITIN 194   Studies/Results: Dg Chest  Port 1 View  01/28/2015   CLINICAL DATA:  Post dialysis catheter placement  EXAM: PORTABLE CHEST - 1 VIEW  COMPARISON:  01/25/2015  FINDINGS: Cardiomegaly. Patchy airspace is bilaterally again noted. There is right IJ dual-lumen dialysis catheter with tip in right atrium. No pneumothorax.  IMPRESSION: Right IJ dialysis catheter with tip in right atrium. No pneumothorax. Again noted bilateral patchy airspace disease.   Electronically Signed   By: Lahoma Crocker M.D.   On: 01/28/2015 09:52   Dg Fluoro Guide Cv Line-no Report  01/28/2015   CLINICAL DATA:    FLOURO GUIDE CV LINE  Fluoroscopy was utilized by the requesting physician.  No radiographic  interpretation.    Marland Kitchen amLODipine  5 mg Oral BID  . atorvastatin  40 mg Oral q1800  . calcitRIOL  0.25 mcg Oral Daily  . calcium acetate  667 mg Oral TID WC  .  ceFAZolin (ANCEF) IV  1 g Intravenous to XRAY  .  furosemide  120 mg Intravenous 3 times per day  . heparin  5,000 Units Subcutaneous 3 times per day  . insulin aspart  0-5 Units Subcutaneous QHS  . insulin aspart  0-9 Units Subcutaneous TID WC  . sodium chloride  3 mL Intravenous Q12H    BMET    Component Value Date/Time   NA 133* 01/27/2015 0540   K 3.9 01/27/2015 0540   CL 96* 01/27/2015 0540   CO2 23 01/27/2015 0540   GLUCOSE 86 01/27/2015 0540   BUN 63* 01/27/2015 0540   CREATININE 8.09* 01/27/2015 0540   CREATININE 1.87* 12/27/2012 0933   CALCIUM 6.4* 01/27/2015 0540   GFRNONAA 7* 01/27/2015 0540   GFRAA 8* 01/27/2015 0540   CBC    Component Value Date/Time   WBC 6.5 01/27/2015 0540   RBC 2.64* 01/27/2015 0540   RBC 4.10* 04/14/2012 1122   HGB 7.8* 01/27/2015 0540   HCT 23.2* 01/27/2015 0540   PLT 306 01/27/2015 0540   MCV 87.9 01/27/2015 0540   MCH 29.5 01/27/2015 0540   MCHC 33.6 01/27/2015 0540   RDW 14.2 01/27/2015 0540   LYMPHSABS 2.0 01/21/2013 1509   MONOABS 0.4 01/21/2013 1509   EOSABS 0.5 01/21/2013 1509   BASOSABS 0.0 01/21/2013 1509    Assessment/Plan:  1. AKI/CKD stage 4 likely ESRD given 2 years without medical care and had underlying poorly controlled DM and HTN with advanced CKD at baseline now with rising Scr and echogenic kidneys on [place].  For vascular access and initiate HD 1. Will complete GN workup for completeness sake but doubtful that this is reversible. 2. HTN- stable 3. Anemia of chronic disease- low iron stores, will start ESA and iron replacement 4. Secondary HPTH- started phoslo and calcitriol will follow ca/phos/iPTH 5. DM type 2 per primary svc 6. CAD- stable 7. Diastolic CHF- stable 8. Vascular access- s/p RIJ TDC and left brachiocephalic AVF today.  [place] A

## 2015-01-28 NOTE — Transfer of Care (Signed)
Immediate Anesthesia Transfer of Care Note  Patient: Stephen Lane  Procedure(s) Performed: Procedure(s): LEFT BRACHIAL-CEPHALIC ARTERIOVENOUS (AV) FISTULA CREATION (Left) ULTRASOUND GUIDED INSERTION OF HEMODIALYSIS CATHETER RIGHT IJ (N/A)  Patient Location: PACU  Anesthesia Type:MAC  Level of Consciousness: awake, alert , oriented and patient cooperative  Airway & Oxygen Therapy: Patient Spontanous Breathing  Post-op Assessment: Report given to RN and Post -op Vital signs reviewed and stable  Post vital signs: Reviewed and stable  Last Vitals:  Filed Vitals:   01/28/15 0409  BP: 108/63  Pulse: 83  Temp: 36.7 C  Resp: 18    Complications: No apparent anesthesia complications

## 2015-01-28 NOTE — Anesthesia Postprocedure Evaluation (Signed)
Anesthesia Post Note  Patient: Stephen Lane  Procedure(s) Performed: Procedure(s) (LRB): LEFT BRACHIAL-CEPHALIC ARTERIOVENOUS (AV) FISTULA CREATION (Left) ULTRASOUND GUIDED INSERTION OF HEMODIALYSIS CATHETER RIGHT IJ (N/A)  Anesthesia type: MAC  Patient location: PACU  Post pain: Pain level controlled and Adequate analgesia  Post assessment: Post-op Vital signs reviewed, Patient's Cardiovascular Status Stable and Respiratory Function Stable  Last Vitals:  Filed Vitals:   01/28/15 1000  BP: 123/76  Pulse: 77  Temp: 36.4 C  Resp: 22    Post vital signs: Reviewed and stable  Level of consciousness: awake, alert  and oriented  Complications: No apparent anesthesia complications

## 2015-01-28 NOTE — Op Note (Signed)
OPERATIVE REPORT  Date of Surgery: 01/26/2015 - 01/28/2015  Surgeon: Tinnie Gens, MD  Assistant: Dorise Bullion  Pre-op Diagnosis: End Stage Renal Disease N18.6  Post-op Diagnosis: End Stage Renal Disease N18.6  Procedure: Procedure(s): LEFT BRACHIAL-CEPHALIC ARTERIOVENOUS (AV) FISTULA CREATION ULTRASOUND GUIDED INSERTION OF HEMODIALYSIS CATHETER RIGHT IJ--19 cm-Diateck Anesthesia: Mac  EBL: Minimal  Complications: None  Procedure Details: The patient was taken to the operating room placed in supine position at which time the left upper extremity and upper chest were prepped with Betadine scrub and solution draped in routine sterile manner. The right IJ was imaged using B-mode ultrasound as was the left IJ and both were noted to be widely patent. After infiltration of 1% Xylocaine the right IJ was entered using a supraclavicular approach guidewire passed in the right atrium under fluoroscopic guidance. After dilating the track appropriately 19 cm diateck catheter was passed through peel-away sheath positioned in the right atrium tunneled peripherally and secured with nylon sutures. Wound was closed with Vicryls in subcuticular fashion with Dermabond. Attention was then turned to the left upper extremity. The cephalic vein had been mapped preoperatively and also been mapped with the ultrasound by me and it appeared the lower third of the forearm the vein became too small for fistula creation the radial cephalic level. It was decided to to perform a brachial cephalic fistula. After infiltration of 1% Xylocaine with epinephrine transverse incision was made antecubital area. Antecubital vein dissected free the cephalic branch was 3 mm in size at a minimum. Branches ligated with 3 and 4-0 silk ties and divided. It was ligated distally transected and gently dilated with heparinized saline. Brachial artery was exposed underneath the fascia and encircled with vessel loops. Pulse. Artery was then occluded  proximally and distally on 15 blade extended with Potts scissors. Was excellent inflow. Vein was carefully measured spatulated and anastomosed inside with 60 proline. Vesseloops released there is next pulse and thrill up to the shoulder level. The fistula was then imaged with the ultrasound and there were no competing branches noted. Adequate hemostasis was achieved and the wound closed in layers with Vicryls subcutaneous tic or fashion with Dermabond patient taken to recovery in satisfactory condition   Tinnie Gens, MD 01/28/2015 9:01 AM

## 2015-01-29 ENCOUNTER — Encounter (HOSPITAL_COMMUNITY): Payer: Self-pay | Admitting: Vascular Surgery

## 2015-01-29 ENCOUNTER — Inpatient Hospital Stay (HOSPITAL_COMMUNITY): Payer: Medicaid Other

## 2015-01-29 LAB — RENAL FUNCTION PANEL
ANION GAP: 12 (ref 5–15)
Albumin: 2.4 g/dL — ABNORMAL LOW (ref 3.5–5.0)
BUN: 44 mg/dL — ABNORMAL HIGH (ref 6–20)
CO2: 27 mmol/L (ref 22–32)
Calcium: 6.8 mg/dL — ABNORMAL LOW (ref 8.9–10.3)
Chloride: 94 mmol/L — ABNORMAL LOW (ref 101–111)
Creatinine, Ser: 6.94 mg/dL — ABNORMAL HIGH (ref 0.61–1.24)
GFR calc Af Amer: 9 mL/min — ABNORMAL LOW (ref >60)
GFR calc non Af Amer: 8 mL/min — ABNORMAL LOW (ref >60)
GLUCOSE: 99 mg/dL (ref 65–99)
POTASSIUM: 4 mmol/L (ref 3.5–5.1)
Phosphorus: 6.5 mg/dL — ABNORMAL HIGH (ref 2.5–4.6)
Sodium: 133 mmol/L — ABNORMAL LOW (ref 135–145)

## 2015-01-29 LAB — CBC
HEMATOCRIT: 21.4 % — AB (ref 39.0–52.0)
HEMOGLOBIN: 7.2 g/dL — AB (ref 13.0–17.0)
MCH: 29.6 pg (ref 26.0–34.0)
MCHC: 33.6 g/dL (ref 30.0–36.0)
MCV: 88.1 fL (ref 78.0–100.0)
Platelets: 279 10*3/uL (ref 150–400)
RBC: 2.43 MIL/uL — ABNORMAL LOW (ref 4.22–5.81)
RDW: 14 % (ref 11.5–15.5)
WBC: 5.1 10*3/uL (ref 4.0–10.5)

## 2015-01-29 LAB — GLUCOSE, CAPILLARY
GLUCOSE-CAPILLARY: 156 mg/dL — AB (ref 65–99)
Glucose-Capillary: 129 mg/dL — ABNORMAL HIGH (ref 65–99)
Glucose-Capillary: 147 mg/dL — ABNORMAL HIGH (ref 65–99)
Glucose-Capillary: 102 mg/dL — ABNORMAL HIGH (ref 65–99)

## 2015-01-29 LAB — ANTINUCLEAR ANTIBODIES, IFA: ANA Ab, IFA: NEGATIVE

## 2015-01-29 MED ORDER — OXYCODONE HCL 5 MG PO TABS
5.0000 mg | ORAL_TABLET | ORAL | Status: DC | PRN
  Administered 2015-02-01: 5 mg via ORAL
  Filled 2015-01-29 (×2): qty 1

## 2015-01-29 MED ORDER — INFLUENZA VAC SPLIT QUAD 0.5 ML IM SUSY
0.5000 mL | PREFILLED_SYRINGE | INTRAMUSCULAR | Status: AC
  Administered 2015-01-30: 0.5 mL via INTRAMUSCULAR
  Filled 2015-01-29: qty 0.5

## 2015-01-29 MED ORDER — HEPARIN SODIUM (PORCINE) 1000 UNIT/ML DIALYSIS: 20.0000 [IU]/kg | INTRAMUSCULAR | Status: DC | PRN

## 2015-01-29 NOTE — Clinical Documentation Improvement (Signed)
Internal Medicine  Can the diagnosis of CHF be further specified?    Acuity - Acute, Chronic, Acute on Chronic  Other  Clinically Undetermined  PLEASE clarify if condition present.  Document any associated diagnoses/conditions Peripheral edema, CKD, DM, HTN  Supporting Information: BNP 982 CXR showing diffuse patchy nodular airspace disease bilaterally and bilateral pleural effusions.  Consult: Pulm: bilateral crackles present to the mid-lung   2L O2 by nasal cannula  Nephro: #8 Diastolic CHF Cardio: volume overload is related to his renal dysfunction.  no acute cardiac abnormality. No further in-hospital testing is needed at this time. ejection fraction is maintained in the low normal range. does have a wall motion abnormality.  cannot tell if this is old or new Treatment: Lasix $RemoveBeforeDE'120mg'[id number]$  q8h  Please exercise your independent, professional judgment when responding. A specific answer is not anticipated or expected.   Thank You,  Philippa Chester, RN, BSN, [place]

## 2015-01-29 NOTE — Procedures (Signed)
   HEMODIALYSIS TREATMENT NOTE:  3 hour low-heparin dialysis completed via right IJ tunneled catheter. Exit site unremarkable. Goal met: 3.2 liters removed without interruption in ultrafiltration. Hemodynamically stable, SBP120-130s during HD. All blood was reinfused. Report called to Jonni Sanger, Therapist, sports.  [place], RN, CDN

## 2015-01-29 NOTE — Progress Notes (Addendum)
[facility]  Subjective  - Doing well over all   Objective 110/62 82 99.9 F (37.7 C) (Oral) 17 93%  Intake/Output Summary (Last 24 hours) at 01/29/15 1013 Last data filed at 01/29/15 0636  Gross per 24 hour  Intake   1026 ml  Output      0 ml  Net   1026 ml     Palpable thrill left AV fistula Grip 5/5 Incision C/D/I  Assessment/Planning: POD # 1 Left LEFT BRACHIAL-CEPHALIC ARTERIOVENOUS (AV) FISTULA CREATION  Stable AV fistula  Do not stick for 12 weeks he will F/U with Dr. Kellie Simmering in 6 weeks   Stephen Lane 01/29/2015 10:13 AM --  Laboratory Lab Results:  Recent Labs  01/28/15 2244 01/29/15 0604  WBC 6.4 5.1  HGB 7.3* 7.2*  HCT 21.9* 21.4*  PLT 281 279   BMET  Recent Labs  01/28/15 2243 01/29/15 0604  NA 132* 133*  K 4.4 4.0  CL 94* 94*  CO2 24 27  GLUCOSE 116* 99  BUN 67* 44*  CREATININE 8.61* 6.94*  CALCIUM 6.4* 6.8*    COAG Lab Results  Component Value Date   INR 1.11 01/28/2015   INR 0.95 04/15/2012   No results found for: PTT    Agree with above assessment We'll see patient in 6 weeks in the office for follow-up

## 2015-01-29 NOTE — Progress Notes (Signed)
Patient ID: Stephen Lane, male   DOB: 07-07-1960, 54 y.o.   MRN: [id number] S:feels better O:BP 114/65 mmHg  Pulse 84  Temp(Src) 98.1 F (36.7 C) (Oral)  Resp 18  Ht $R'5\' 3"'bp$  (1.6 m)  Wt 71.8 kg (158 lb 4.6 oz)  BMI 28.05 kg/m2  SpO2 96%  Intake/Output Summary (Last 24 hours) at 01/29/15 1056 Last data filed at 01/29/15 0900  Gross per 24 hour  Intake   1026 ml  Output      0 ml  Net   1026 ml   Intake/Output: I/O last 3 completed shifts: In: 1842 [P.O.:1182; I.V.:350; IV Piggyback:310] Out: 30 [Blood:30]  Intake/Output this shift:    Weight change: -2.2 kg (-4 lb 13.6 oz) Gen:WD WN HM in NAd CVS:no rub Resp:bilateral rales IPJ:ASNKNL Ext:2+ pitting edema, lower ext, LUE AVF +T/B   Recent Labs Lab 01/25/15 2236 01/26/15 0916 01/26/15 1417 01/27/15 0540 01/28/15 1212 01/28/15 2243 01/29/15 0604  NA 136 137  --  133* 136  136 132* 133*  K 4.2 4.2  --  3.9 4.1  4.1 4.4 4.0  CL 100* 102  --  96* 97*  96* 94* 94*  CO2 22 24  --  $R'23 24  24 24 27  'is$ GLUCOSE 129* 96  --  86 92  93 116* 99  BUN 63* 60*  --  63* 64*  64* 67* 44*  CREATININE 7.92* 7.85*  --  8.09* 8.56*  8.55* 8.61* 6.94*  ALBUMIN  --  2.4*  --   --  2.4* 2.4* 2.4*  CALCIUM 6.6* 6.3*  --  6.4* 6.7*  6.7* 6.4* 6.8*  PHOS  --   --  6.6*  --  7.9* 8.0* 6.5*  AST  --  12*  --   --   --   --   --   ALT  --  15*  --   --   --   --   --    Liver Function Tests:  Recent Labs Lab 01/26/15 0916 01/28/15 1212 01/28/15 2243 01/29/15 0604  AST 12*  --   --   --   ALT 15*  --   --   --   ALKPHOS 155*  --   --   --   BILITOT 0.5  --   --   --   PROT 6.0*  --   --   --   ALBUMIN 2.4* 2.4* 2.4* 2.4*   No results for input(s): LIPASE, AMYLASE in the last 168 hours. No results for input(s): AMMONIA in the last 168 hours. CBC:  Recent Labs Lab 01/26/15 0916 01/27/15 0540 01/28/15 1212 01/28/15 2244 01/29/15 0604  WBC 7.0 6.5 6.2 6.4 5.1  HGB 7.5* 7.8* 7.5* 7.3* 7.2*  HCT 22.2* 23.2* 22.1*  21.9* 21.4*  MCV 88.1 87.9 89.1 87.6 88.1  PLT 260 306 269 281 279   Cardiac Enzymes:  Recent Labs Lab 01/26/15 0916  TROPONINI 0.03   CBG:  Recent Labs Lab 01/28/15 0913 01/28/15 1025 01/28/15 1616 01/28/15 2129 01/29/15 0811  GLUCAP 85 82 132* 137* 129*    Iron Studies: No results for input(s): IRON, TIBC, TRANSFERRIN, FERRITIN in the last 72 hours. Studies/Results: Dg Chest 2 View  01/29/2015   CLINICAL DATA:  Pulmonary infiltrate and shortness of breath.  EXAM: CHEST  2 VIEW  COMPARISON:  01/28/2015  FINDINGS: Right-sided dialysis catheter tips overlie the superior vena cava and right atrium.  Heart is enlarged. There  has been improvement in diffuse bilateral pulmonary infiltrates bilaterally. Faint patchy densities persist in upper and lower lobes bilaterally. Findings are consistent with improving pulmonary edema. There are small bilateral pleural effusions.  IMPRESSION: Improvement in pulmonary edema.   Electronically Signed   By: Nolon Nations M.D.   On: 01/29/2015 10:19   Dg Chest Port 1 View  01/28/2015   CLINICAL DATA:  Post dialysis catheter placement  EXAM: PORTABLE CHEST - 1 VIEW  COMPARISON:  01/25/2015  FINDINGS: Cardiomegaly. Patchy airspace is bilaterally again noted. There is right IJ dual-lumen dialysis catheter with tip in right atrium. No pneumothorax.  IMPRESSION: Right IJ dialysis catheter with tip in right atrium. No pneumothorax. Again noted bilateral patchy airspace disease.   Electronically Signed   By: Lahoma Crocker M.D.   On: 01/28/2015 09:52   Dg Fluoro Guide Cv Line-no Report  01/28/2015   CLINICAL DATA:    FLOURO GUIDE CV LINE  Fluoroscopy was utilized by the requesting physician.  No radiographic  interpretation.    Marland Kitchen amLODipine  5 mg Oral BID  . atorvastatin  40 mg Oral q1800  . calcitRIOL  0.25 mcg Oral Daily  . calcium acetate  667 mg Oral TID WC  . furosemide  120 mg Intravenous 3 times per day  . heparin  5,000 Units Subcutaneous 3 times  per day  . insulin aspart  0-5 Units Subcutaneous QHS  . insulin aspart  0-9 Units Subcutaneous TID WC  . sodium chloride  3 mL Intravenous Q12H    BMET    Component Value Date/Time   NA 133* 01/29/2015 0604   K 4.0 01/29/2015 0604   CL 94* 01/29/2015 0604   CO2 27 01/29/2015 0604   GLUCOSE 99 01/29/2015 0604   BUN 44* 01/29/2015 0604   CREATININE 6.94* 01/29/2015 0604   CREATININE 1.87* 12/27/2012 0933   CALCIUM 6.8* 01/29/2015 0604   GFRNONAA 8* 01/29/2015 0604   GFRAA 9* 01/29/2015 0604   CBC    Component Value Date/Time   WBC 5.1 01/29/2015 0604   RBC 2.43* 01/29/2015 0604   RBC 4.10* 04/14/2012 1122   HGB 7.2* 01/29/2015 0604   HCT 21.4* 01/29/2015 0604   PLT 279 01/29/2015 0604   MCV 88.1 01/29/2015 0604   MCH 29.6 01/29/2015 0604   MCHC 33.6 01/29/2015 0604   RDW 14.0 01/29/2015 0604   LYMPHSABS 2.0 01/21/2013 1509   MONOABS 0.4 01/21/2013 1509   EOSABS 0.5 01/21/2013 1509   BASOSABS 0.0 01/21/2013 1509     Assessment/Plan:  1. AKI/CKD stage 4 likely ESRD given 2 years without medical care and had underlying poorly controlled DM and HTN with advanced CKD at baseline now with rising Scr and echogenic kidneys on [place]. First HD on 01/28/15 1. Will complete GN workup for completeness sake but doubtful that this is reversible. 2. Plan for daily HD for total of 3 treatments 2. HTN- stable 3. Pulmonary - bilateral rales, possible pulmonary edema vs. Infectious process.  Will UF with HD and follow CXR and exam 4. Anemia of chronic disease- low iron stores, will start ESA and iron replacement 5. Secondary HPTH- started phoslo and calcitriol will follow ca/phos/iPTH 6. DM type 2 per primary svc 7. CAD- stable 8. Diastolic CHF- stable 9. Vascular access- s/p RIJ TDC and left brachiocephalic AVF 2/70/35 by Dr. Kellie Simmering.  [place] A

## 2015-01-29 NOTE — Progress Notes (Signed)
Family Medicine Teaching Service Daily Progress Note Intern Pager: [phone]  Patient name: Stephen Lane Medical record number: [id number] Date of birth: 20-Apr-1961 Age: 54 y.o. Gender: male  Primary Care Provider: Phill Myron, MD Consultants: Cardiology, Renal Code Status: Full  Pt Overview and Major Events to Date:  8/23: admitted for SOB, chest pain  Assessment and Plan:  Stephen Lane is a 54 y.o. male presenting with shortness of breath x 2 weeks. PMH is significant for CKD, CAD s/p stenting 2013, T2DM, HTN, HLD  # Pulmonary- SOB for last 2-3 weeks: Improved SOB, likely secondary to renal failure and volume overload - s/p ceftriaxone and azithromycin x1D, d/ced as unlikely infectious picture - Repeat CXR today, to assess resolution of pulmonary infltrates - supplemental O2 as needed  # HTN: Improved  - Cont amlodipine 88m  - hydralazine IV PRN for SBP >180, DBP >110  Cardiac- Echo shows G2 diastolic dysfucntion with focal hypokinesis -  no need for inpatient intervention. Ok to f/u as outpt  # Oliguric/AKI on CKD: worsening Cr to 8.09 8/24, renal [place]ows evidence of chronic renal disease-  - Renal following- > HD placement and dialysis today 8/25 - Etiology of Renal disease likely greatly contributed to by uncontrolled DM and HTN. 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 Arm fistula site pain well controlled with pain meds  Pt reports that she the last two years he did not take his medicines because he took a new job with less hours. He also felt his eye sight was worsening and wanted to take a new job that would be better for his eyes[place] When he came back he never got them refilled. . As he was working less hours, he felt he could not afford to buy his medicines Also reports eye sight worsening over last 7 years. Had " blood in his right eye" for which he had surgery 7 years ago. At that time his vision changes and " blood in his eye" was due to his diabetes.  He was also diagnosed with cataracts in his left eye at around the same time and had lazer surgery for it. He states his vision never got better in either eye and now is always blurry. He has not since been seen by an eye doctor for prescription glasses because of the cost. He does occasionally use [employer] reading glasses  Objective: Temp:  [97.2 F (36.2 C)-99.9 F (37.7 C)] 99.9 F (37.7 C) (08/26 0526) Pulse Rate:  [  77-90] 82 (08/26 0526) Resp:  [16-22] 17 (08/26 0526) BP: (110-159)/(62-92) 110/62 mmHg (08/26 0526) SpO2:  [93 %-99 %] 93 % (08/26 0526) Weight:  [158 lb 4.6 oz (71.8 kg)] 158 lb 4.6 oz (71.8 kg) (08/26 0146) Physical Exam: General: NAD,sitting in bedside char eating breakfast Cardiovascular: RRR, no murmyurs Respiratory: CTAB, mild scattered crackles, normal WOB Abdomen: soft, non tender, no r/g no distended Extremities: 2+ LE edema,  Non tender, left arm, fistula site with dressing  C/d/i  Vision- limited bilateral visual fields Left eye difficulty counting fingers approximately 1 ft from face Right eye: Able to count fingers  Laboratory:  Recent Labs Lab 01/28/15 1212 01/28/15 2244 01/29/15 0604  WBC 6.2 6.4 5.1  HGB 7.5* 7.3* 7.2*  HCT 22.1* 21.9* 21.4*  PLT 269 281 279    Recent Labs Lab 01/26/15 0916  01/28/15 1212 01/28/15 2243 01/29/15 0604  NA 137  < > 136  136 132* 133*  K 4.2  < > 4.1  4.1 4.4 4.0  CL 102  < > 97*  96* 94* 94*  CO2 24  < > _0 BUN 60*  < > 64*  64* 67* 44*  CREATININE 7.85*  < > 8.56*  8.55* 8.61* 6.94*  CALCIUM 6.3*  < > 6.7*  6.7* 6.4* 6.8*  PROT 6.0*  --   --   --   --   BILITOT 0.5  --   --   --   --   ALKPHOS 155*  --   --   --   --   ALT 15*  --   --   --   --   AST 12*  --   --   --   --   GLUCOSE 96  < > 92  93 116* 99  < > = values in this interval not displayed.    Imaging/Diagnostic Tests: Renal US 1. Increased renal parenchymal echogenicity consistent with chronic medical renal disease. No obstructive uropathy. 2. Bilateral pleural effusions, incidentally noted  CXR Diffuse patchy nodular airspace disease throughout both lungs. Bilateral pleural effusions.    Veatrice Bourbon, MD 01/29/2015, 9:29 AM PGY-2, [place] Intern pager: [phone], text pages welcome

## 2015-01-30 LAB — ANTISTREPTOLYSIN O TITER: ASO: 116 IU/mL (ref 0.0–200.0)

## 2015-01-30 LAB — RENAL FUNCTION PANEL
ALBUMIN: 2.1 g/dL — AB (ref 3.5–5.0)
ANION GAP: 9 (ref 5–15)
BUN: 28 mg/dL — AB (ref 6–20)
CALCIUM: 7.7 mg/dL — AB (ref 8.9–10.3)
CO2: 27 mmol/L (ref 22–32)
CREATININE: 5.75 mg/dL — AB (ref 0.61–1.24)
Chloride: 95 mmol/L — ABNORMAL LOW (ref 101–111)
GFR calc Af Amer: 12 mL/min — ABNORMAL LOW (ref >60)
GFR calc non Af Amer: 10 mL/min — ABNORMAL LOW (ref >60)
GLUCOSE: 126 mg/dL — AB (ref 65–99)
PHOSPHORUS: 5 mg/dL — AB (ref 2.5–4.6)
Potassium: 4.1 mmol/L (ref 3.5–5.1)
SODIUM: 131 mmol/L — AB (ref 135–145)

## 2015-01-30 LAB — COMPLEMENT, TOTAL: Compl, Total (CH50): >60 U/mL (ref 42–60)

## 2015-01-30 LAB — CBC
HCT: 20.4 % — ABNORMAL LOW (ref 39.0–52.0)
Hemoglobin: 6.8 g/dL — CL (ref 13.0–17.0)
MCH: 29.8 pg (ref 26.0–34.0)
MCHC: 33.7 g/dL (ref 30.0–36.0)
MCV: 88.6 fL (ref 78.0–100.0)
PLATELETS: 233 10*3/uL (ref 150–400)
RBC: 2.28 MIL/uL — ABNORMAL LOW (ref 4.22–5.81)
RDW: 13.9 % (ref 11.5–15.5)
WBC: 5 10*3/uL (ref 4.0–10.5)

## 2015-01-30 LAB — VITAMIN D 1,25 DIHYDROXY
VITAMIN D3 1, 25 (OH): 19 pg/mL
Vitamin D 1, 25 (OH)2 Total: 20 pg/mL
Vitamin D2 1, 25 (OH)2: <10 pg/mL

## 2015-01-30 LAB — HEPATITIS B SURFACE ANTIBODY,QUALITATIVE: HEP B S AB: NONREACTIVE

## 2015-01-30 LAB — ANTI-DNA ANTIBODY, DOUBLE-STRANDED: ds DNA Ab: <1 IU/mL (ref 0–9)

## 2015-01-30 LAB — GLUCOSE, CAPILLARY
GLUCOSE-CAPILLARY: 103 mg/dL — AB (ref 65–99)
Glucose-Capillary: 114 mg/dL — ABNORMAL HIGH (ref 65–99)
Glucose-Capillary: 120 mg/dL — ABNORMAL HIGH (ref 65–99)

## 2015-01-30 LAB — ABO/RH: ABO/RH(D): O POS

## 2015-01-30 LAB — HEPATITIS B CORE ANTIBODY, TOTAL: HEP B C TOTAL AB: NEGATIVE

## 2015-01-30 LAB — TYPE AND SCREEN
ABO/RH(D): O POS
Antibody Screen: NEGATIVE

## 2015-01-30 MED ORDER — SODIUM CHLORIDE 0.9 % IV SOLN
125.0000 mg | INTRAVENOUS | Status: DC
  Administered 2015-01-30 – 2015-02-03 (×2): 125 mg via INTRAVENOUS
  Filled 2015-01-30 (×5): qty 10

## 2015-01-30 NOTE — Progress Notes (Signed)
Patient ID: Stephen Lane, male   DOB: [birth year], 54 y.o.   MRN: [id number] S:breathing better no complaints O:BP 115/66 mmHg  Pulse 78  Temp(Src) 98.4 F (36.9 C) (Oral)  Resp 17  Ht $R'5\' 3"'CN$  (1.6 m)  Wt 69.3 kg (152 lb 12.5 oz)  BMI 27.07 kg/m2  SpO2 92%  Intake/Output Summary (Last 24 hours) at 01/30/15 0858 Last data filed at 01/29/15 1712  Gross per 24 hour  Intake    480 ml  Output   3200 ml  Net  -2720 ml   Intake/Output: I/O last 3 completed shifts: In: 1146 [P.O.:960; IV Piggyback:186] Out: 3200 [Other:3200]  Intake/Output this shift:    Weight change: 0.5 kg (1 lb 1.6 oz) Gen:WD WN HM in NAD CVS:no rub Resp:crackles in L>R mid lung fields posteriorly  TKW:IOXBDZ Ext:2+ edema, LUE AVF +T/B   Recent Labs Lab 01/25/15 2236 01/26/15 0916 01/26/15 1417 01/27/15 0540 01/28/15 1212 01/28/15 2243 01/29/15 0604 01/30/15 0528  NA 136 137  --  133* 136  136 132* 133* 131*  K 4.2 4.2  --  3.9 4.1  4.1 4.4 4.0 4.1  CL 100* 102  --  96* 97*  96* 94* 94* 95*  CO2 22 24  --  $R'23 24  24 24 27 27  'SR$ GLUCOSE 129* 96  --  86 92  93 116* 99 126*  BUN 63* 60*  --  63* 64*  64* 67* 44* 28*  CREATININE 7.92* 7.85*  --  8.09* 8.56*  8.55* 8.61* 6.94* 5.75*  ALBUMIN  --  2.4*  --   --  2.4* 2.4* 2.4* 2.1*  CALCIUM 6.6* 6.3*  --  6.4* 6.7*  6.7* 6.4* 6.8* 7.7*  PHOS  --   --  6.6*  --  7.9* 8.0* 6.5* 5.0*  AST  --  12*  --   --   --   --   --   --   ALT  --  15*  --   --   --   --   --   --    Liver Function Tests:  Recent Labs Lab 01/26/15 0916  01/28/15 2243 01/29/15 0604 01/30/15 0528  AST 12*  --   --   --   --   ALT 15*  --   --   --   --   ALKPHOS 155*  --   --   --   --   BILITOT 0.5  --   --   --   --   PROT 6.0*  --   --   --   --   ALBUMIN 2.4*  < > 2.4* 2.4* 2.1*  < > = values in this interval not displayed. No results for input(s): LIPASE, AMYLASE in the last 168 hours. No results for input(s): AMMONIA in the last 168 hours. CBC:  Recent  Labs Lab 01/27/15 0540 01/28/15 1212 01/28/15 2244 01/29/15 0604 01/30/15 0528  WBC 6.5 6.2 6.4 5.1 5.0  HGB 7.8* 7.5* 7.3* 7.2* 6.8*  HCT 23.2* 22.1* 21.9* 21.4* 20.4*  MCV 87.9 89.1 87.6 88.1 88.6  PLT 306 269 281 279 233   Cardiac Enzymes:  Recent Labs Lab 01/26/15 0916  TROPONINI 0.03   CBG:  Recent Labs Lab 01/29/15 0811 01/29/15 1135 01/29/15 1743 01/29/15 2032 01/30/15 0722  GLUCAP 129* 147* 102* 156* 103*    Iron Studies: No results for input(s): IRON, TIBC, TRANSFERRIN, FERRITIN in the last 72 hours. Studies/Results:  Dg Chest 2 View  01/29/2015   CLINICAL DATA:  Pulmonary infiltrate and shortness of breath.  EXAM: CHEST  2 VIEW  COMPARISON:  01/28/2015  FINDINGS: Right-sided dialysis catheter tips overlie the superior vena cava and right atrium.  Heart is enlarged. There has been improvement in diffuse bilateral pulmonary infiltrates bilaterally. Faint patchy densities persist in upper and lower lobes bilaterally. Findings are consistent with improving pulmonary edema. There are small bilateral pleural effusions.  IMPRESSION: Improvement in pulmonary edema.   Electronically Signed   By: Nolon Nations M.D.   On: 01/29/2015 10:19   Dg Chest Port 1 View  01/28/2015   CLINICAL DATA:  Post dialysis catheter placement  EXAM: PORTABLE CHEST - 1 VIEW  COMPARISON:  01/25/2015  FINDINGS: Cardiomegaly. Patchy airspace is bilaterally again noted. There is right IJ dual-lumen dialysis catheter with tip in right atrium. No pneumothorax.  IMPRESSION: Right IJ dialysis catheter with tip in right atrium. No pneumothorax. Again noted bilateral patchy airspace disease.   Electronically Signed   By: Lahoma Crocker M.D.   On: 01/28/2015 09:52   . amLODipine  5 mg Oral BID  . atorvastatin  40 mg Oral q1800  . calcitRIOL  0.25 mcg Oral Daily  . calcium acetate  667 mg Oral TID WC  . furosemide  120 mg Intravenous 3 times per day  . heparin  5,000 Units Subcutaneous 3 times per day  .  Influenza vac split quadrivalent PF  0.5 mL Intramuscular Tomorrow-1000  . insulin aspart  0-5 Units Subcutaneous QHS  . insulin aspart  0-9 Units Subcutaneous TID WC  . sodium chloride  3 mL Intravenous Q12H    BMET    Component Value Date/Time   NA 131* 01/30/2015 0528   K 4.1 01/30/2015 0528   CL 95* 01/30/2015 0528   CO2 27 01/30/2015 0528   GLUCOSE 126* 01/30/2015 0528   BUN 28* 01/30/2015 0528   CREATININE 5.75* 01/30/2015 0528   CREATININE 1.87* 12/27/2012 0933   CALCIUM 7.7* 01/30/2015 0528   GFRNONAA 10* 01/30/2015 0528   GFRAA 12* 01/30/2015 0528   CBC    Component Value Date/Time   WBC 5.0 01/30/2015 0528   RBC 2.28* 01/30/2015 0528   RBC 4.10* 04/14/2012 1122   HGB 6.8* 01/30/2015 0528   HCT 20.4* 01/30/2015 0528   PLT 233 01/30/2015 0528   MCV 88.6 01/30/2015 0528   MCH 29.8 01/30/2015 0528   MCHC 33.7 01/30/2015 0528   RDW 13.9 01/30/2015 0528   LYMPHSABS 2.0 01/21/2013 1509   MONOABS 0.4 01/21/2013 1509   EOSABS 0.5 01/21/2013 1509   BASOSABS 0.0 01/21/2013 1509     Assessment/Plan:  1. AKI/CKD stage 4 likely ESRD given 2 years without medical care and had underlying poorly controlled DM and HTN with advanced CKD at baseline now with rising Scr and echogenic kidneys on [place]. First HD on 01/28/15 1. Normal complements, ANA, dsDNA, ASO titer. 2. awaiting ANCA, anti-GBM, SPEP/UPEP.  (but doubtful that this is reversible given his advanced CKD due to DM and HTN 2 years ago). 3. Plan for daily HD for total of 3 treatments then transition to IHD on Monday or Tuesday 4. Will need SW assistance with emergency medicaid/medicare to help arrange for outpt HD if this is not reversible 2. HTN- stable 3. Pulmonary - bilateral rales, possible pulmonary edema vs. Infectious process. Will UF with HD and follow CXR and exam 1. Improving with HD and UF 2. Anti-GBM and ANCA pending  4. Anemia of chronic disease- low iron stores, started ESA and iron  replacement 5. Secondary HPTH- started phoslo and calcitriol will follow ca/phos/iPTH 6. DM type 2 per primary svc 7. CAD- stable 8. Diastolic CHF- stable 9. Vascular access- s/p RIJ TDC and left brachiocephalic AVF 01/30/82 by Dr. Kellie Simmering.  Jewett A

## 2015-01-30 NOTE — Progress Notes (Signed)
CRITICAL VALUE ALERT  Critical value received:  Hgb 6.8 Date of notification:  8/27 Nurse who received alert:  Percell Boston

## 2015-01-30 NOTE — Progress Notes (Signed)
Family Medicine Teaching Service Daily Progress Note Intern Pager: [phone]  Patient name: Stephen Lane Medical record number: [id number] Date of birth: 1961-04-08 Age: 54 y.o. Gender: male  Primary Care Provider: Phill Myron, MD Consultants: Cardiology, Renal Code Status: Full  Pt Overview and Major Events to Date:  8/23: admitted for SOB, chest pain 8/25: OR for HD cath and AVF, first dialysis 8/26: 2nd HD  Assessment and Plan: Stephen Lane is a 54 y.o. male presenting with shortness of breath x 2 weeks. PMH is significant for CKD, CAD s/p stenting 2013, T2DM, HTN, HLD  # Oliguric/AKI on CKD: worsening Cr to 8.09 8/24, renal US shows evidence of chronic renal disease. Etiology of Renal disease likely greatly contributed to by uncontrolled DM and HTN. However, must consider alternate etiologies like Good pastures, Wegener's, microscopic polyangitis, Churg strauss. GN workup: elevated ESR >140, total complement >60; negative ANA, Hepatitis B/C , anti-dsDNA, ASO; pending ANCA, SPEP, anti-GBM, anti mpo/pr3. - Renal following, appreciate recs - on HD now per renal - condom cath currently but no UOP since 8/23 - discontinue lasix as no UOP  # Pulmonary- SOB for last 2-3 weeks: Improved SOB, likely secondary to renal failure and volume overload - s/p ceftriaxone and azithromycin x1D, d/ced as unlikely infectious picture - supplemental O2 as needed  # HTN: Improved  - Cont amlodipine 22m  - hydralazine IV PRN for SBP >180, DBP >110  # Anemia: Baseline hgb from 2 years ago around 11, now presents on admission with Hgb 8.6. Likely ACD. Hgb downtrending 7.5>>6.8 on 8/27. - Iron studies sig for Iron level 14 , TIBC 241, ferritin 194,  c/w ACD - type and screen   # Cardiac- Echo shows G2 diastolic dysfunction with focal hypokinesis -  no need for inpatient intervention. 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 Does state he produced a small amount of urine although none is recorded. He denies any pain currently. No CP, no SOB. Wants to know when he is able to go home.  Objective: Temp:  [97.7 F (36.5 C)-98.7 F (37.1 C)] 98.4 F (36.9 C) (08/27 0800) Pulse Rate:  [78-87] 78 (08/27 0800) Resp:  [16-18] 17 (08/27 0800) BP: (108-143)/(55-78) 115/66 mmHg (08/27 0800) SpO2:  [92 %-100 %] 92 % (08/27 0800) Weight:  [152 lb 5.4 oz (69.1 kg)-159 lb 6.3 oz (72.3 kg)] 152 lb 12.5 oz (69.3 kg) (08/26 2010) Physical Exam: General: NAD,sitting in bedside char eating breakfast Cardiovascular: RRR, no murmyurs Respiratory: CTAB, mild crackles at the bases, normal WOB Abdomen: soft, non tender, no r/g no distended Extremities: 2+ LE edema,  Non tender, left arm, fistula site with dressing C/d/i  Laboratory:  Recent Labs Lab 01/28/15 2244 01/29/15 0604 01/30/15 0528  WBC 6.4 5.1 5.0  HGB 7.3* 7.2* 6.8*  HCT 21.9* 21.4* 20.4*  PLT  281 279 233    Recent Labs Lab 01/26/15 0916  01/28/15 2243 01/29/15 0604 01/30/15 0528  NA 137  < > 132* 133* 131*  K 4.2  < > 4.4 4.0 4.1  CL 102  < > 94* 94* 95*  CO2 24  < > _0 BUN 60*  < > 67* 44* 28*  CREATININE 7.85*  < > 8.61* 6.94* 5.75*  CALCIUM 6.3*  < > 6.4* 6.8* 7.7*  PROT 6.0*  --   --   --   --   BILITOT 0.5  --   --   --   --    ALKPHOS 155*  --   --   --   --   ALT 15*  --   --   --   --   AST 12*  --   --   --   --   GLUCOSE 96  < > 116* 99 126*  < > = values in this interval not displayed.    Imaging/Diagnostic Tests: Renal US 1. Increased renal parenchymal echogenicity consistent with chronic medical renal disease. No obstructive uropathy. 2. Bilateral pleural effusions, incidentally noted  CXR Diffuse patchy nodular airspace disease throughout both lungs. Bilateral pleural effusions.   Leone Brand, MD 01/30/2015, 8:13 AM PGY-3, [place] Intern pager: [phone], text pages welcome

## 2015-01-31 DIAGNOSIS — R918 Other nonspecific abnormal finding of lung field: Secondary | ICD-10-CM | POA: Insufficient documentation

## 2015-01-31 DIAGNOSIS — D631 Anemia in chronic kidney disease: Secondary | ICD-10-CM

## 2015-01-31 LAB — RENAL FUNCTION PANEL
ANION GAP: 8 (ref 5–15)
Albumin: 2.1 g/dL — ABNORMAL LOW (ref 3.5–5.0)
BUN: 17 mg/dL (ref 6–20)
CALCIUM: 7.6 mg/dL — AB (ref 8.9–10.3)
CO2: 28 mmol/L (ref 22–32)
Chloride: 97 mmol/L — ABNORMAL LOW (ref 101–111)
Creatinine, Ser: 4.34 mg/dL — ABNORMAL HIGH (ref 0.61–1.24)
GFR calc non Af Amer: 14 mL/min — ABNORMAL LOW (ref >60)
GFR, EST AFRICAN AMERICAN: 16 mL/min — AB (ref >60)
GLUCOSE: 104 mg/dL — AB (ref 65–99)
POTASSIUM: 3.7 mmol/L (ref 3.5–5.1)
Phosphorus: 3.9 mg/dL (ref 2.5–4.6)
SODIUM: 133 mmol/L — AB (ref 135–145)

## 2015-01-31 LAB — KAPPA/LAMBDA LIGHT CHAINS
KAPPA, LAMDA LIGHT CHAIN RATIO: 1.85 — AB (ref 0.26–1.65)
Kappa free light chain: 190.58 mg/L — ABNORMAL HIGH (ref 3.30–19.40)
LAMDA FREE LIGHT CHAINS: 102.86 mg/L — AB (ref 5.71–26.30)

## 2015-01-31 LAB — CBC
HEMATOCRIT: 22 % — AB (ref 39.0–52.0)
HEMOGLOBIN: 7.4 g/dL — AB (ref 13.0–17.0)
MCH: 30 pg (ref 26.0–34.0)
MCHC: 33.6 g/dL (ref 30.0–36.0)
MCV: 89.1 fL (ref 78.0–100.0)
Platelets: 221 10*3/uL (ref 150–400)
RBC: 2.47 MIL/uL — AB (ref 4.22–5.81)
RDW: 13.5 % (ref 11.5–15.5)
WBC: 5 10*3/uL (ref 4.0–10.5)

## 2015-01-31 LAB — GLUCOSE, CAPILLARY
GLUCOSE-CAPILLARY: 93 mg/dL (ref 65–99)
GLUCOSE-CAPILLARY: 121 mg/dL — AB (ref 65–99)
GLUCOSE-CAPILLARY: 114 mg/dL — AB (ref 65–99)
Glucose-Capillary: 186 mg/dL — ABNORMAL HIGH (ref 65–99)
Glucose-Capillary: 173 mg/dL — ABNORMAL HIGH (ref 65–99)

## 2015-01-31 NOTE — Progress Notes (Addendum)
CM met with patient at bedside regarding follow up care, patient denies having a PCP or health insurance. Patient has DM and HTN with CKD stage 4  Creat 7.9, he recieved [facility] HD on 01/28/15. Discussed the [facility] for follow up with a PCP, and  explained that as a patient  he will access utilize to the [facility] pharmacy post discharge, he is agreeable with plan to establish care. Appointment scheduled for follow up care Tuesday 9/6 at 2p with Dr. Jarold Song, patient verbalized understand and appreciation for the assistance. Spoke with patient and Dr. Juleen China about discharge prescriptions being sent by e-scribe to [facility] pharmacy information placed in chart. CM will continue to monitor for final disposition plan.

## 2015-01-31 NOTE — Progress Notes (Signed)
Patient ID: Stephen Lane, male   DOB: Feb 18, 1961, 54 y.o.   MRN: [id number] S:feels better O:BP 126/69 mmHg  Pulse 74  Temp(Src) 98.3 F (36.8 C) (Oral)  Resp 17  Ht $R'5\' 3"'Mq$  (1.6 m)  Wt 67.4 kg (148 lb 9.4 oz)  BMI 26.33 kg/m2  SpO2 98%  Intake/Output Summary (Last 24 hours) at 01/31/15 0905 Last data filed at 01/31/15 1324  Gross per 24 hour  Intake    843 ml  Output   3651 ml  Net  -2808 ml   Intake/Output: I/O last 3 completed shifts: In: 401 [P.O.:840; I.V.:3] Out: 0272 [Urine:650; Other:3001]  Intake/Output this shift:    Weight change: -2 kg (-4 lb 6.6 oz) Gen:WD WN HM in NAD CVS:no rub Resp:crackles bilateral mid lung fields ZDG:[id number] Ext:1+ edema   Recent Labs Lab 01/26/15 0916 01/26/15 1417 01/27/15 0540 01/28/15 1212 01/28/15 2243 01/29/15 0604 01/30/15 0528 01/31/15 0628  NA 137  --  133* 136  136 132* 133* 131* 133*  K 4.2  --  3.9 4.1  4.1 4.4 4.0 4.1 3.7  CL 102  --  96* 97*  96* 94* 94* 95* 97*  CO2 24  --  $R'23 24  24 24 27 27 28  'BN$ GLUCOSE 96  --  86 92  93 116* 99 126* 104*  BUN 60*  --  63* 64*  64* 67* 44* 28* 17  CREATININE 7.85*  --  8.09* 8.56*  8.55* 8.61* 6.94* 5.75* 4.34*  ALBUMIN 2.4*  --   --  2.4* 2.4* 2.4* 2.1* 2.1*  CALCIUM 6.3*  --  6.4* 6.7*  6.7* 6.4* 6.8* 7.7* 7.6*  PHOS  --  6.6*  --  7.9* 8.0* 6.5* 5.0* 3.9  AST 12*  --   --   --   --   --   --   --   ALT 15*  --   --   --   --   --   --   --    Liver Function Tests:  Recent Labs Lab 01/26/15 0916  01/29/15 0604 01/30/15 0528 01/31/15 0628  AST 12*  --   --   --   --   ALT 15*  --   --   --   --   ALKPHOS 155*  --   --   --   --   BILITOT 0.5  --   --   --   --   PROT 6.0*  --   --   --   --   ALBUMIN 2.4*  < > 2.4* 2.1* 2.1*  < > = values in this interval not displayed. No results for input(s): LIPASE, AMYLASE in the last 168 hours. No results for input(s): AMMONIA in the last 168 hours. CBC:  Recent Labs Lab 01/28/15 1212 01/28/15 2244  01/29/15 0604 01/30/15 0528 01/31/15 0628  WBC 6.2 6.4 5.1 5.0 5.0  HGB 7.5* 7.3* 7.2* 6.8* 7.4*  HCT 22.1* 21.9* 21.4* 20.4* 22.0*  MCV 89.1 87.6 88.1 88.6 89.1  PLT 269 281 279 233 221   Cardiac Enzymes:  Recent Labs Lab 01/26/15 0916  TROPONINI 0.03   CBG:  Recent Labs Lab 01/30/15 0722 01/30/15 1133 01/30/15 1632 01/30/15 2236 01/31/15 0734  GLUCAP 103* 114* 120* 186* 93    Iron Studies: No results for input(s): IRON, TIBC, TRANSFERRIN, FERRITIN in the last 72 hours. Studies/Results: Dg Chest 2 View  01/29/2015   CLINICAL DATA:  Pulmonary  infiltrate and shortness of breath.  EXAM: CHEST  2 VIEW  COMPARISON:  01/28/2015  FINDINGS: Right-sided dialysis catheter tips overlie the superior vena cava and right atrium.  Heart is enlarged. There has been improvement in diffuse bilateral pulmonary infiltrates bilaterally. Faint patchy densities persist in upper and lower lobes bilaterally. Findings are consistent with improving pulmonary edema. There are small bilateral pleural effusions.  IMPRESSION: Improvement in pulmonary edema.   Electronically Signed   By: Stephen Lane M.D.   On: 01/29/2015 10:19   . amLODipine  5 mg Oral BID  . atorvastatin  40 mg Oral q1800  . calcitRIOL  0.25 mcg Oral Daily  . calcium acetate  667 mg Oral TID WC  . ferric gluconate (FERRLECIT/NULECIT) IV  125 mg Intravenous Q T,Th,Sa-HD  . heparin  5,000 Units Subcutaneous 3 times per day  . insulin aspart  0-5 Units Subcutaneous QHS  . insulin aspart  0-9 Units Subcutaneous TID WC  . sodium chloride  3 mL Intravenous Q12H    BMET    Component Value Date/Time   NA 133* 01/31/2015 0628   K 3.7 01/31/2015 0628   CL 97* 01/31/2015 0628   CO2 28 01/31/2015 0628   GLUCOSE 104* 01/31/2015 0628   BUN 17 01/31/2015 0628   CREATININE 4.34* 01/31/2015 0628   CREATININE 1.87* 12/27/2012 0933   CALCIUM 7.6* 01/31/2015 0628   GFRNONAA 14* 01/31/2015 0628   GFRAA 16* 01/31/2015 0628   CBC     Component Value Date/Time   WBC 5.0 01/31/2015 0628   RBC 2.47* 01/31/2015 0628   RBC 4.10* 04/14/2012 1122   HGB 7.4* 01/31/2015 0628   HCT 22.0* 01/31/2015 0628   PLT 221 01/31/2015 0628   MCV 89.1 01/31/2015 0628   MCH 30.0 01/31/2015 0628   MCHC 33.6 01/31/2015 0628   RDW 13.5 01/31/2015 0628   LYMPHSABS 2.0 01/21/2013 1509   MONOABS 0.4 01/21/2013 1509   EOSABS 0.5 01/21/2013 1509   BASOSABS 0.0 01/21/2013 1509     Assessment/Plan:  1. AKI/CKD stage 4 likely ESRD given 2 years without medical care and had underlying poorly controlled DM and HTN with advanced CKD at baseline now with rising Scr and echogenic kidneys on [place]. First HD on 01/28/15 1. Normal complements, ANA, dsDNA, ASO titer. 2. awaiting ANCA, anti-GBM, SPEP/UPEP. (but doubtful that this is reversible given his advanced CKD due to DM and HTN 2 years ago). 3. Plan for daily HD for total of 3 treatments then transition to IHD on Monday or Tuesday 4. Will need SW assistance with emergency medicaid/medicare to help arrange for outpt HD if this is not reversible 5. Starting to make some urine on his own so will hold off on another HD session until Tuesday  2. HTN- stable 3. Pulmonary - bilateral rales, possible pulmonary edema vs. Infectious process. Will UF with HD and follow CXR and exam 1. Improving with HD and UF 2. Anti-GBM and ANCA pending 4. Anemia of chronic disease- low iron stores, started ESA and iron replacement 5. Secondary HPTH- started phoslo and calcitriol will follow ca/phos/iPTH 6. DM type 2 per primary svc 7. CAD- stable 8. Diastolic CHF- stable 9. Vascular access- s/p RIJ TDC and left brachiocephalic AVF 8/67/61 by Dr. Kellie Lane.  Stephen Lane

## 2015-01-31 NOTE — Progress Notes (Signed)
Family Medicine Teaching Service Daily Progress Note Intern Pager: [phone]  Patient name: Stephen Lane Medical record number: [id number] Date of birth: 12-07-1960 Age: 54 y.o. Gender: male  Primary Care Provider: Phill Myron, MD Consultants: Cardiology, Renal Code Status: Full  Pt Overview and Major Events to Date:  8/23: admitted for SOB, chest pain 8/25: OR for HD cath and AVF, first dialysis 8/26: HD 8/27: HD, anemic to 6.8  Assessment and Plan: Stephen Lane is a 54 y.o. male presenting with shortness of breath x 2 weeks. PMH is significant for CKD, CAD s/p stenting 2013, T2DM, HTN, HLD  # Oliguric/AKI on CKD: worsening Cr to 8.09 8/24, renal US shows evidence of chronic renal disease. Etiology of Renal disease likely greatly contributed to by uncontrolled DM and HTN. However, must consider alternate etiologies like Good pastures, Wegener's, microscopic polyangitis, Churg strauss. GN workup: elevated ESR >140, total complement >60; negative ANA, Hepatitis B/C , anti-dsDNA, ASO; pending ANCA, SPEP, anti-GBM, anti mpo/pr3. - Renal following, appreciate recs - on HD now per renal - condom cath currently but no UOP since 8/23, lasix stopped 8/27  # Pulmonary- SOB for last 2-3 weeks: Improved SOB, likely secondary to renal failure and volume overload - s/p ceftriaxone and azithromycin x1D, d/ced as unlikely infectious picture - supplemental O2 as needed  # HTN: Improved  - Cont amlodipine 98m  - hydralazine IV PRN for SBP >180, DBP >110  # Anemia: Baseline hgb from 2 years ago around 11, now presents on admission with Hgb 8.6. Likely ACD. Hgb stable at 7.4 on 8/28. Pt asymptomatic - Iron studies sig for Iron level 14 , TIBC 241, ferritin 194,  c/w ACD - type and screen done  # Cardiac- Echo shows G2 diastolic dysfunction with focal hypokinesis -  no need for inpatient intervention. 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 - Cont calcitriol and phoslo epirically - 1, 25 dihydroxy vitamin D of 20  FEN/GI: diet carb mod  Prophylaxis: heparin sq  Disposition: Pending clinical improvement and outpatient HD set-up  Subjective:  NAEON, feeling well, no pain, no lightheadedness or dizziness, breathing comfortable on room air  Objective: Temp:  [98 F (36.7 C)-98.4 F (36.9 C)] 98.3 F (36.8 C) (08/28 0736) Pulse Rate:  [71-86] 74 (08/28 0736) Resp:  [12-20] 17 (08/28 0736) BP: (103-143)/(56-78) 126/69 mmHg (08/28 0736) SpO2:  [95 %-98 %] 98 % (08/28 0736) Weight:  [148 lb 9.4 oz (67.4 kg)-154 lb 15.7 oz (70.3 kg)] 148 lb 9.4 oz (67.4 kg) (08/27 2200) Physical Exam: General: NAD,sitting in bedside chair Cardiovascular: RRR, no murmurs Respiratory: CTAB, mild crackles at the bases, normal WOB Abdomen: soft, non tender, no r/g no distended Extremities: 2+ LE edema,  Non tender, left arm, fistula site with dressing C/d/i  Laboratory:  Recent Labs Lab 01/29/15 0604 01/30/15 0528 01/31/15 0628  WBC 5.1 5.0 5.0  HGB 7.2* 6.8* 7.4*  HCT 21.4* 20.4* 22.0*  PLT 279 233 221    Recent Labs Lab 01/26/15 0916  01/29/15 0604 01/30/15 0528 01/31/15 07841  NA 137  < > 133* 131* 133*  K 4.2  < > 4.0 4.1 3.7  CL 102  < > 94* 95* 97*  CO2 24  < > '27 27 28  ' BUN 60*  < > 44* 28* 17  CREATININE 7.85*  < > 6.94* 5.75* 4.34*  CALCIUM 6.3*  < > 6.8* 7.7* 7.6*  PROT 6.0*  --   --   --   --   BILITOT 0.5  --   --   --   --   ALKPHOS 155*  --   --   --   --   ALT 15*  --   --   --   --   AST 12*  --   --   --    --   GLUCOSE 96  < > 99 126* 104*  < > = values in this interval not displayed.   Imaging/Diagnostic Tests: Renal US 01/26/15 1. Increased renal parenchymal echogenicity consistent with chronic medical renal disease. No obstructive uropathy. 2. Bilateral pleural effusions, incidentally noted  CXR 01/25/15 Diffuse patchy nodular airspace disease throughout both lungs. Bilateral pleural effusions.  CXR 01/29/15 Heart is enlarged. There has been improvement in diffuse bilateral pulmonary infiltrates bilaterally. Faint patchy densities persist in upper and lower lobes bilaterally. Findings are consistent with improving pulmonary edema. There are small bilateral pleural effusions.   Frazier Richards, MD 01/31/2015, 8:19 AM PGY-3, [place] Intern pager: [phone], text pages welcome

## 2015-02-01 DIAGNOSIS — I509 Heart failure, unspecified: Secondary | ICD-10-CM

## 2015-02-01 DIAGNOSIS — I5030 Unspecified diastolic (congestive) heart failure: Secondary | ICD-10-CM

## 2015-02-01 LAB — PROTEIN ELECTROPHORESIS, SERUM
A/G RATIO SPE: 0.7 (ref 0.7–1.7)
ALPHA-1-GLOBULIN: 0.4 g/dL (ref 0.0–0.4)
ALPHA-2-GLOBULIN: 0.9 g/dL (ref 0.4–1.0)
Albumin ELP: 2.5 g/dL — ABNORMAL LOW (ref 2.9–4.4)
BETA GLOBULIN: 1 g/dL (ref 0.7–1.3)
GLOBULIN, TOTAL: 3.5 g/dL (ref 2.2–3.9)
Gamma Globulin: 1.2 g/dL (ref 0.4–1.8)
Total Protein ELP: 6 g/dL (ref 6.0–8.5)

## 2015-02-01 LAB — MPO/PR-3 (ANCA) ANTIBODIES

## 2015-02-01 LAB — CBC
HEMATOCRIT: 25.5 % — AB (ref 39.0–52.0)
HEMOGLOBIN: 8.6 g/dL — AB (ref 13.0–17.0)
MCH: 29.7 pg (ref 26.0–34.0)
MCHC: 33.7 g/dL (ref 30.0–36.0)
MCV: 87.9 fL (ref 78.0–100.0)
PLATELETS: 280 10*3/uL (ref 150–400)
RBC: 2.9 MIL/uL — AB (ref 4.22–5.81)
RDW: 13.3 % (ref 11.5–15.5)
WBC: 6.6 10*3/uL (ref 4.0–10.5)

## 2015-02-01 LAB — UIFE/LIGHT CHAINS/TP QN, 24-HR UR
% BETA, URINE: 18.5 %
ALPHA 1 URINE: 6.9 %
ALPHA 2 UR: 8.9 %
Albumin, U: 49 %
FREE LAMBDA LT CHAINS, UR: 123 mg/L — AB (ref 0.24–6.66)
FREE LT CHN EXCR RATE: 616 mg/L — AB (ref 1.35–24.19)
Free Kappa/Lambda Ratio: 5.01 (ref 2.04–10.37)
GAMMA GLOBULIN URINE: 16.7 %
Total Protein, Urine: 528 mg/dL

## 2015-02-01 LAB — GLUCOSE, CAPILLARY
GLUCOSE-CAPILLARY: 141 mg/dL — AB (ref 65–99)
GLUCOSE-CAPILLARY: 145 mg/dL — AB (ref 65–99)
GLUCOSE-CAPILLARY: 114 mg/dL — AB (ref 65–99)
Glucose-Capillary: 114 mg/dL — ABNORMAL HIGH (ref 65–99)

## 2015-02-01 LAB — RENAL FUNCTION PANEL
ANION GAP: 10 (ref 5–15)
Albumin: 2.2 g/dL — ABNORMAL LOW (ref 3.5–5.0)
BUN: 30 mg/dL — ABNORMAL HIGH (ref 6–20)
CHLORIDE: 93 mmol/L — AB (ref 101–111)
CO2: 27 mmol/L (ref 22–32)
CREATININE: 6.11 mg/dL — AB (ref 0.61–1.24)
Calcium: 7.7 mg/dL — ABNORMAL LOW (ref 8.9–10.3)
GFR, EST AFRICAN AMERICAN: 11 mL/min — AB (ref >60)
GFR, EST NON AFRICAN AMERICAN: 9 mL/min — AB (ref >60)
Glucose, Bld: 135 mg/dL — ABNORMAL HIGH (ref 65–99)
POTASSIUM: 3.7 mmol/L (ref 3.5–5.1)
Phosphorus: 4.9 mg/dL — ABNORMAL HIGH (ref 2.5–4.6)
Sodium: 130 mmol/L — ABNORMAL LOW (ref 135–145)

## 2015-02-01 LAB — GLOMERULAR BASEMENT MEMBRANE ANTIBODIES: GBM Ab: 3 units (ref 0–20)

## 2015-02-01 MED ORDER — DARBEPOETIN ALFA 60 MCG/0.3ML IJ SOSY
60.0000 ug | PREFILLED_SYRINGE | INTRAMUSCULAR | Status: DC
  Filled 2015-02-01: qty 0.3

## 2015-02-01 NOTE — Discharge Summary (Signed)
[facility] Discharge Summary  Patient name: Stephen Lane Medical record number: [id number] Date of birth: Sep 12, 1960 Age: 54 y.o. Gender: male Date of Admission: 01/26/2015  Date of Discharge: 02/04/2015  Admitting Physician: Lind Covert, MD  Primary Care Provider: Phill Myron, MD Consultants:  Renal Cardiology  Indication for Hospitalization:  Renal Failure HpEF Discharge Diagnoses/Problem List:  Patient Active Problem List   Diagnosis Date Noted  . Heart failure with preserved ejection fraction 02/01/2015  . Pulmonary infiltrates   . Type 2 diabetes mellitus with diabetic chronic kidney disease   . ESRD (end stage renal disease)   . CKD (chronic kidney disease) stage 5, GFR less than 15 ml/min 01/26/2015  . Anemia 01/26/2015  . Acute renal failure syndrome   . Diabetic neuropathy 06/04/2012  . Coronary Artery Disease 04/24/2012  . Hypertension, essential 04/24/2012  . Diabetes mellitus, insulin dependent (IDDM), controlled 04/24/2012  . Hyperlipidemia 04/17/2012  . GERD (gastroesophageal reflux disease) 04/15/2012  . Normocytic anemia 04/13/2012     Disposition: Stable  Discharge Condition: Good  Discharge Exam:   Temp: [98.1 F (36.7 C)-98.4 F (36.9 C)] 98.2 F (36.8 C) (09/01 0754) Pulse Rate: [71-80] 77 (09/01 0926) Resp: [13-19] 19 (09/01 0754) BP: (104-137)/(57-74) 127/66 mmHg (09/01 0926) SpO2: [97 %-98 %] 98 % (09/01 0754) Weight: [148 lb 2.4 oz (67.2 kg)-154 lb 15.7 oz (70.3 kg)] 149 lb 14.6 oz (68 kg) (09/01 0754) Physical Exam: General: NAD, in dialysis Cardiovascular: RRR, no murmurs Respiratory: CTAB, normal WOB Abdomen: soft, non tender, no r/g non distended Extremities: 2+ LE edema, Non tender, left arm, fistula site with dressing C/d/i  Brief Hospital Course:  54 y/o M with PMH significant for HTN, DM2 and h/o CAD presented to the ED with several weeks of shortness of breath.  On  admission to the ED he was found to show evidence of volume overload on exam and with pulmonary infiltrates on CXR concerning for pulmonary edema. He additionally had a serum Creatinine of 8.09 and was oliguric. Renal was consulted with the recommendation to maintain high dose lasix until initiation of HD. He received dialysis and by day of discharge [facility] with scheduled dialysis. He also had work up for potential other causes of his renal failure which negative. His renal failure was determined to be likely secondary to prolonged untreated hypertension and diabetes. He was noted to have secondary hyperparathyroidism likely secondary to prolonged renal dysfunction. He was started on calcitriol and phoslo. These were continued on discharge.  Additionally an echo was also obtained to rule out cardiac cause and showed grade 2 diastolic dysfunctoin with normal ejection left ventricular ejection fraction, Hfpef. Cardiology was consulted with the recommendation to follow up with them as an outpt. He was found to be hypertensive on presentation and was started on Norvasc $RemoveBe'5mg'TgXAtQUiM$  PO with good control of his blood pressures. The lisinopril and HCTZ that he was supposed to be taking prior to admission were discontinued.   He was additionally started on Lantus 5 units for his diabetes with good control of his blood sugars. He was started on atorvastatin 40 mg while inpatient   Issues for Follow Up:  1. Consider outpatient cardiology follow up 2. Ensure compliance with dialysis schedule 3. Consider started an oral agent for diabetes  Significant Procedures:  Echo Dialysis  Significant Labs and Imaging:   Recent Labs Lab 02/02/15 0650 02/03/15 0410 02/04/15 0633  WBC 6.5 7.8 5.8  HGB 7.5* 7.7* 8.2*  HCT 22.4* 23.5* 25.2*  PLT 244 272 252    Recent Labs Lab 01/31/15 0628 02/01/15 0334 02/02/15 0650 02/03/15 0410 02/04/15 0633  NA 133* 130* 127* 130* 135   K 3.7 3.7 4.0 4.2 4.3  CL 97* 93* 91* 92* 97*  CO2 $Re'28 27 26 24 29  'seu$ GLUCOSE 104* 135* 217* 105* 91  BUN 17 30* 43* 53* 23*  CREATININE 4.34* 6.11* 7.44* 8.36* 5.01*  CALCIUM 7.6* 7.7* 7.6* 8.0* 8.1*  PHOS 3.9 4.9* 5.5* 5.8* 3.8  ALBUMIN 2.1* 2.2* 2.3* 2.7* 2.6*      Results/Tests Pending at Time of Discharge:  None  Discharge Medications:    Medication List    TAKE these medications        amLODipine 5 MG tablet  Commonly known as:  NORVASC  Take 1 tablet (5 mg total) by mouth daily.     aspirin 81 MG EC tablet  Take 1 tablet (81 mg total) by mouth daily.     calcium acetate 667 MG capsule  Commonly known as:  PHOSLO  Take 3 capsules (2,001 mg total) by mouth 3 (three) times daily with meals.     hydrochlorothiazide 25 MG tablet  Commonly known as:  HYDRODIURIL  Take 1 tablet (25 mg total) by mouth daily.     insulin glargine 100 UNIT/ML injection  Commonly known as:  LANTUS  Inject 0.05 mLs (5 Units total) into the skin at bedtime.     lisinopril 40 MG tablet  Commonly known as:  PRINIVIL,ZESTRIL  Take 1 tablet (40 mg total) by mouth daily.     oxyCODONE-acetaminophen 5-325 MG per tablet  Commonly known as:  ROXICET  Take 1 tablet by mouth every 6 (six) hours as needed for severe pain.     pravastatin 40 MG tablet  Commonly known as:  PRAVACHOL  Take 1 tablet (40 mg total) by mouth daily.        Discharge Instructions: Please refer to Patient Instructions section of EMR for full details.  Patient was counseled important signs and symptoms that should prompt return to medical care, changes in medications, dietary instructions, activity restrictions, and follow up appointments.   Follow-Up Appointments: Follow-up Information    Follow up with Tinnie Gens, MD In 6 weeks.   Specialties:  Vascular Surgery, Interventional Cardiology, Cardiology   Why:  Our office will call you to arrange an appointment (sent)   Contact information:   [place] [phone]       Follow up with Lockie Pares, MD On 02/09/2015.   Specialty:  Family Medicine   Why:  11:00 am   Contact information:   [place] [address] [phone]       Veatrice Bourbon, MD 02/04/2015, 11:51 AM PGY-2, [place]

## 2015-02-01 NOTE — Progress Notes (Signed)
Family Medicine Teaching Service Daily Progress Note Intern Pager: [phone]  Patient name: Stephen Lane Medical record number: [id number] Date of birth: [birth year] Age: 54 y.o. Gender: male  Primary Care Provider: Phill Myron, MD Consultants: Cardiology, Renal Code Status: Full  Pt Overview and Major Events to Date:  8/23: admitted for SOB, chest pain 8/25: OR for HD cath and AVF, first dialysis 8/26: HD 8/27: HD, anemic to 6.8  Assessment and Plan: Stephen Lane is a 54 y.o. male presenting with shortness of breath x 2 weeks. PMH is significant for CKD, CAD s/p stenting 2013, T2DM, HTN, HLD  # ESRD: SCr improving with HD, 6.11 8/29, Received HD on 8/25-8/27. Etiology of Renal disease likely greatly contributed to by uncontrolled DM and HTN. However, must consider alternate etiologies like Good pastures, Wegener's, microscopic polyangitis, Churg strauss.  GN workup: elevated ESR >140, total complement >60, elevated Kappa/lambda light chains; negative ANA, Hepatitis B/C , anti-dsDNA, ASO; pending ANCA, SPEP, anti-GBM, anti mpo/pr3. - Renal following, appreciate recs - on HD now per renal - Will need outpatient IHD - Lasix per renal  # Metabolic Bone disorder- - Concern for secondary parahyperthyroidism - Cont calcitriol and phoslo epirically - follow Phos and Ca - 1, 25 dihydroxy vitamin D of 20  # Pulmonary- SOB for last 2-3 weeks: Improved SOB, likely secondary to renal failure, newly diagnosed diastolic heart failure and volume overload - s/p ceftriaxone and azithromycin x1D, d/ced as unlikely infectious picture - supplemental O2 as needed  # HTN: Improved  - Cont amlodipine 37m  - hydralazine IV PRN for SBP >180, DBP >110  # Anemia: Baseline hgb from 2 years ago around 11, now presents on admission with Hgb 8.6. 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 Hgb pending for this AM - Iron studies sig for Iron level 14 , TIBC 241, ferritin 194,  c/w ACD - type and screen done - consider  initiation of Darbepoeitin  # HpEF, H/o CAD- Echo shows G2 diastolic dysfunction with focal hypokinesis -  Per cardiology-no need for inpatient intervention. Ok to f/u as outpt - follow Hbg, maintain threshold of 8 for transfusion  # T2DM: Repeat A1c 6.3 - CBG monitoring qAC qHS - SSI - needs outpt diabetic eye exam on discharge  # HLD: lipid panel--> mildly elevated LDL, low HDL - continue atorvastatin  # Decreased Vision: Likely secondary to chronic uncontrolled diabetes and hypertension - Will need Optho visit as outpt  FEN/GI: diet carb mod  Prophylaxis: heparin sq  Disposition: Pending clinical improvement and outpatient HD set-up  Subjective:  Denies SOB, chest pain. Feels legs are less swollen  Objective: Temp:  [98 F (36.7 C)-98.3 F (36.8 C)] 98.2 F (36.8 C) (08/29 0419) Pulse Rate:  [72-78] 72 (08/29 0419) Resp:  [18-19] 18 (08/29 0419) BP: (97-126)/(57-69) 97/57 mmHg (08/29 0419) SpO2:  [99 %] 99 % (08/29 0419) Physical Exam: General: NAD,sitting in bedside chair Cardiovascular: RRR, no murmurs Respiratory: CTAB, mild cracklesto the mid lung fields, normal WOB Abdomen: soft, non tender, no r/g non distended Extremities: 2+ LE edema,  Non tender, left arm, fistula site with dressing C/d/i  Laboratory:  Recent Labs Lab 01/29/15 0604 01/30/15 0528 01/31/15 0628  WBC 5.1 5.0 5.0  HGB 7.2* 6.8* 7.4*  HCT 21.4* 20.4* 22.0*  PLT 279 233 221    Recent Labs Lab 01/26/15 0916  01/30/15 0528 01/31/15 0628 02/01/15 0334  NA 137  < > 131* 133* 130*  K 4.2  < > 4.1 3.7 3.7  CL 102  < > 95* 97* 93*  CO2 24  < > _0 BUN 60*  < > 28* 17 30*  CREATININE 7.85*  < > 5.75* 4.34* 6.11*  CALCIUM 6.3*  < > 7.7* 7.6* 7.7*  PROT 6.0*  --   --   --   --   BILITOT 0.5  --   --   --   --   ALKPHOS 155*  --   --   --   --   ALT 15*  --   --   --   --   AST 12*  --   --   --   --   GLUCOSE 96  < > 126* 104* 135*  < > = values in this interval not  displayed.   Imaging/Diagnostic Tests: Renal US 01/26/15 1. Increased renal parenchymal echogenicity consistent with chronic medical renal disease. No obstructive uropathy. 2. Bilateral pleural effusions, incidentally noted  CXR 01/25/15 Diffuse patchy nodular airspace disease throughout both lungs. Bilateral pleural effusions.  CXR 01/29/15 Heart is enlarged. There has been improvement in diffuse bilateral pulmonary infiltrates bilaterally. Faint patchy densities persist in upper and lower lobes bilaterally. Findings are consistent with improving pulmonary edema. There are small bilateral pleural effusions.   Veatrice Bourbon, MD 02/01/2015, 8:00 AM PGY-2, [place] Intern pager: [phone], text pages welcome

## 2015-02-01 NOTE — Progress Notes (Signed)
Patient ID: Stephen Lane, male   DOB: 10-20-1960, 54 y.o.   MRN: [id number] S: Feels better this AM. Continues to report minimal UO.  O:BP 97/57 mmHg  Pulse 72  Temp(Src) 98.2 F (36.8 C) (Oral)  Resp 18  Ht $R'5\' 3"'Cs$  (1.6 m)  Wt 148 lb 9.4 oz (67.4 kg)  BMI 26.33 kg/m2  SpO2 99%  Intake/Output Summary (Last 24 hours) at 02/01/15 0728 Last data filed at 02/01/15 0604  Gross per 24 hour  Intake    960 ml  Output    375 ml  Net    585 ml   Intake/Output: I/O last 3 completed shifts: In: 963 [P.O.:960; I.V.:3] Out: 3376 [Urine:375; Other:3001]  Intake/Output this shift:    Weight change:   General: Hispanic male, sitting in chair, NAD Cardiac: RRR, no rubs, murmurs or gallops Pulm: crackles to the mid-lung fields bilaterally Abd: soft, nontender, nondistended, BS present Ext: warm and well perfused, 1+ pitting edema bilaterally,  R tunneled IJ, L AVF with palpable thrill Neuro: responds to questions appropriately; moving all extremities freely   Recent Labs Lab 01/26/15 0916 01/26/15 1417 01/27/15 0540 01/28/15 1212 01/28/15 2243 01/29/15 0604 01/30/15 0528 01/31/15 0628 02/01/15 0334  NA 137  --  133* 136  136 132* 133* 131* 133* 130*  K 4.2  --  3.9 4.1  4.1 4.4 4.0 4.1 3.7 3.7  CL 102  --  96* 97*  96* 94* 94* 95* 97* 93*  CO2 24  --  $R'23 24  24 24 27 27 28 27  'Uo$ GLUCOSE 96  --  86 92  93 116* 99 126* 104* 135*  BUN 60*  --  63* 64*  64* 67* 44* 28* 17 30*  CREATININE 7.85*  --  8.09* 8.56*  8.55* 8.61* 6.94* 5.75* 4.34* 6.11*  ALBUMIN 2.4*  --   --  2.4* 2.4* 2.4* 2.1* 2.1* 2.2*  CALCIUM 6.3*  --  6.4* 6.7*  6.7* 6.4* 6.8* 7.7* 7.6* 7.7*  PHOS  --  6.6*  --  7.9* 8.0* 6.5* 5.0* 3.9 4.9*  AST 12*  --   --   --   --   --   --   --   --   ALT 15*  --   --   --   --   --   --   --   --    Liver Function Tests:  Recent Labs Lab 01/26/15 0916  01/30/15 0528 01/31/15 0628 02/01/15 0334  AST 12*  --   --   --   --   ALT 15*  --   --   --   --    ALKPHOS 155*  --   --   --   --   BILITOT 0.5  --   --   --   --   PROT 6.0*  --   --   --   --   ALBUMIN 2.4*  < > 2.1* 2.1* 2.2*  < > = values in this interval not displayed. No results for input(s): LIPASE, AMYLASE in the last 168 hours. No results for input(s): AMMONIA in the last 168 hours. CBC:  Recent Labs Lab 01/28/15 1212 01/28/15 2244 01/29/15 0604 01/30/15 0528 01/31/15 0628  WBC 6.2 6.4 5.1 5.0 5.0  HGB 7.5* 7.3* 7.2* 6.8* 7.4*  HCT 22.1* 21.9* 21.4* 20.4* 22.0*  MCV 89.1 87.6 88.1 88.6 89.1  PLT 269 281 279 233 221   Cardiac  Enzymes:  Recent Labs Lab 01/26/15 0916  TROPONINI 0.03   CBG:  Recent Labs Lab 01/30/15 2236 01/31/15 0734 01/31/15 1202 01/31/15 1630 01/31/15 2033  GLUCAP 186* 93 121* 114* 173*    Iron Studies: No results for input(s): IRON, TIBC, TRANSFERRIN, FERRITIN in the last 72 hours. Studies/Results: No results found. Marland Kitchen amLODipine  5 mg Oral BID  . atorvastatin  40 mg Oral q1800  . calcitRIOL  0.25 mcg Oral Daily  . calcium acetate  667 mg Oral TID WC  . ferric gluconate (FERRLECIT/NULECIT) IV  125 mg Intravenous Q T,Th,Sa-HD  . heparin  5,000 Units Subcutaneous 3 times per day  . insulin aspart  0-5 Units Subcutaneous QHS  . insulin aspart  0-9 Units Subcutaneous TID WC  . sodium chloride  3 mL Intravenous Q12H    BMET    Component Value Date/Time   NA 130* 02/01/2015 0334   K 3.7 02/01/2015 0334   CL 93* 02/01/2015 0334   CO2 27 02/01/2015 0334   GLUCOSE 135* 02/01/2015 0334   BUN 30* 02/01/2015 0334   CREATININE 6.11* 02/01/2015 0334   CREATININE 1.87* 12/27/2012 0933   CALCIUM 7.7* 02/01/2015 0334   GFRNONAA 9* 02/01/2015 0334   GFRAA 11* 02/01/2015 0334   CBC    Component Value Date/Time   WBC 5.0 01/31/2015 0628   RBC 2.47* 01/31/2015 0628   RBC 4.10* 04/14/2012 1122   HGB 7.4* 01/31/2015 0628   HCT 22.0* 01/31/2015 0628   PLT 221 01/31/2015 0628   MCV 89.1 01/31/2015 0628   MCH 30.0 01/31/2015 0628    MCHC 33.6 01/31/2015 0628   RDW 13.5 01/31/2015 0628   LYMPHSABS 2.0 01/21/2013 1509   MONOABS 0.4 01/21/2013 1509   EOSABS 0.5 01/21/2013 1509   BASOSABS 0.0 01/21/2013 1509     Assessment/Plan:  1. AKI/CKD stage 4 likely ESRD given 2 years without medical care and had underlying poorly controlled DM and HTN with advanced CKD at baseline now with rising Scr and echogenic kidneys on [place]. Received HD on 8/25-8/27. 1. Normal complements, ANA, dsDNA, ASO titer but elevated kappa/lambda ratio. ANCA, anti-GBM, SPEP/UPEP pending. (but doubtful that this is reversible given his advanced CKD due to DM and HTN 2 years ago). 2. Plan to transition to iHD today with rising creatinine 3. CM arranging for follow-up with [facility] following discharge. 2. HTN- stable 3. Pulmonary - bilateral rales, possible pulmonary edema vs. Infectious process. Will UF with HD and follow CXR and exam 1. Improving with HD and UF 2. Anti-GBM and ANCA pending 4. Anemia of chronic disease- low iron stores, started iron replacement and will need to start Aranesp  5. Secondary HPTH- started phoslo and calcitriol will follow ca/phos/iPTH 6. DM type 2 per primary svc 7. CAD- stable 8. Diastolic CHF- stable 9. Vascular access- s/p RIJ TDC and left brachiocephalic AVF 07/16/92 by Dr. Kellie Simmering.  Posey Pronto, [place]

## 2015-02-01 NOTE — Progress Notes (Signed)
02/01/2015 11:30 AM Hemodialysis Outpatient Note; I have been asked to begin the CLIP process for outpatient scheduling and regarding insurance, Caryl Pina in financial counseling will see this patient on Wednesday August 31 at 9:30 AM. Please see that the patient is available at that time.Thank you. Gordy Savers

## 2015-02-02 LAB — ANCA TITERS
Atypical P-ANCA titer: <1:20 {titer}
C-ANCA: <1:20 {titer}
P-ANCA: <1:20 {titer}

## 2015-02-02 LAB — RENAL FUNCTION PANEL
ANION GAP: 10 (ref 5–15)
Albumin: 2.3 g/dL — ABNORMAL LOW (ref 3.5–5.0)
BUN: 43 mg/dL — ABNORMAL HIGH (ref 6–20)
CALCIUM: 7.6 mg/dL — AB (ref 8.9–10.3)
CO2: 26 mmol/L (ref 22–32)
CREATININE: 7.44 mg/dL — AB (ref 0.61–1.24)
Chloride: 91 mmol/L — ABNORMAL LOW (ref 101–111)
GFR, EST AFRICAN AMERICAN: 8 mL/min — AB (ref >60)
GFR, EST NON AFRICAN AMERICAN: 7 mL/min — AB (ref >60)
Glucose, Bld: 217 mg/dL — ABNORMAL HIGH (ref 65–99)
PHOSPHORUS: 5.5 mg/dL — AB (ref 2.5–4.6)
Potassium: 4 mmol/L (ref 3.5–5.1)
SODIUM: 127 mmol/L — AB (ref 135–145)

## 2015-02-02 LAB — CBC
HCT: 22.4 % — ABNORMAL LOW (ref 39.0–52.0)
HEMOGLOBIN: 7.5 g/dL — AB (ref 13.0–17.0)
MCH: 29.9 pg (ref 26.0–34.0)
MCHC: 33.5 g/dL (ref 30.0–36.0)
MCV: 89.2 fL (ref 78.0–100.0)
PLATELETS: 244 10*3/uL (ref 150–400)
RBC: 2.51 MIL/uL — AB (ref 4.22–5.81)
RDW: 13.6 % (ref 11.5–15.5)
WBC: 6.5 10*3/uL (ref 4.0–10.5)

## 2015-02-02 LAB — GLUCOSE, CAPILLARY
GLUCOSE-CAPILLARY: 199 mg/dL — AB (ref 65–99)
GLUCOSE-CAPILLARY: 75 mg/dL (ref 65–99)
GLUCOSE-CAPILLARY: 151 mg/dL — AB (ref 65–99)
Glucose-Capillary: 111 mg/dL — ABNORMAL HIGH (ref 65–99)

## 2015-02-02 MED ORDER — DARBEPOETIN ALFA 60 MCG/0.3ML IJ SOSY
60.0000 ug | PREFILLED_SYRINGE | INTRAMUSCULAR | Status: DC
  Administered 2015-02-03: 60 ug via INTRAVENOUS
  Filled 2015-02-02: qty 0.3

## 2015-02-02 MED ORDER — INSULIN GLARGINE 100 UNIT/ML ~~LOC~~ SOLN
5.0000 [IU] | Freq: Every day | SUBCUTANEOUS | Status: DC
  Administered 2015-02-02 – 2015-02-03 (×2): 5 [IU] via SUBCUTANEOUS
  Filled 2015-02-02 (×4): qty 0.05

## 2015-02-02 MED ORDER — CALCIUM ACETATE (PHOS BINDER) 667 MG PO CAPS
2001.0000 mg | ORAL_CAPSULE | Freq: Three times a day (TID) | ORAL | Status: DC
  Administered 2015-02-02 – 2015-02-04 (×4): 2001 mg via ORAL
  Filled 2015-02-02 (×3): qty 3

## 2015-02-02 NOTE — Progress Notes (Signed)
Patient ID: Stephen Lane, male   DOB: April 22, 1961, 54 y.o.   MRN: [id number] S: No complains this AM. Reports he would like T/H/Sa schedule.  O:BP 125/63 mmHg  Pulse 77  Temp(Src) 98.3 F (36.8 C) (Oral)  Resp 18  Ht $R'5\' 3"'dw$  (1.6 m)  Wt 148 lb 9.4 oz (67.4 kg)  BMI 26.33 kg/m2  SpO2 99%  Intake/Output Summary (Last 24 hours) at 02/02/15 0809 Last data filed at 02/02/15 0616  Gross per 24 hour  Intake    723 ml  Output      0 ml  Net    723 ml   Intake/Output: I/O last 3 completed shifts: In: 723 [P.O.:720; I.V.:3] Out: 0   Intake/Output this shift:    Weight change:   General: Hispanic male, sitting in chair, NAD Cardiac: RRR, no rubs, murmurs or gallops Pulm: crackles to the mid-lung fields bilaterally, stable from yesterday Abd: soft, nontender, nondistended, BS present Ext: warm and well perfused, 1+ pitting edema bilaterally R>L,  R tunneled IJ, L AVF with palpable thrill Neuro: responds to questions appropriately; moving all extremities freely   Recent Labs Lab 01/26/15 0916  01/28/15 1212 01/28/15 2243 01/29/15 0604 01/30/15 0528 01/31/15 0628 02/01/15 0334 02/02/15 0650  NA 137  < > 136  136 132* 133* 131* 133* 130* 127*  K 4.2  < > 4.1  4.1 4.4 4.0 4.1 3.7 3.7 4.0  CL 102  < > 97*  96* 94* 94* 95* 97* 93* 91*  CO2 24  < > $R'24  24 24 27 27 28 27 26  'Jr$ GLUCOSE 96  < > 92  93 116* 99 126* 104* 135* 217*  BUN 60*  < > 64*  64* 67* 44* 28* 17 30* 43*  CREATININE 7.85*  < > 8.56*  8.55* 8.61* 6.94* 5.75* 4.34* 6.11* 7.44*  ALBUMIN 2.4*  --  2.4* 2.4* 2.4* 2.1* 2.1* 2.2* 2.3*  CALCIUM 6.3*  < > 6.7*  6.7* 6.4* 6.8* 7.7* 7.6* 7.7* 7.6*  PHOS  --   < > 7.9* 8.0* 6.5* 5.0* 3.9 4.9* 5.5*  AST 12*  --   --   --   --   --   --   --   --   ALT 15*  --   --   --   --   --   --   --   --   < > = values in this interval not displayed. Liver Function Tests:  Recent Labs Lab 01/26/15 0916  01/31/15 0628 02/01/15 0334 02/02/15 0650  AST 12*  --   --    --   --   ALT 15*  --   --   --   --   ALKPHOS 155*  --   --   --   --   BILITOT 0.5  --   --   --   --   PROT 6.0*  --   --   --   --   ALBUMIN 2.4*  < > 2.1* 2.2* 2.3*  < > = values in this interval not displayed. No results for input(s): LIPASE, AMYLASE in the last 168 hours. No results for input(s): AMMONIA in the last 168 hours. CBC:  Recent Labs Lab 01/29/15 0604 01/30/15 0528 01/31/15 0628 02/01/15 0912 02/02/15 0650  WBC 5.1 5.0 5.0 6.6 6.5  HGB 7.2* 6.8* 7.4* 8.6* 7.5*  HCT 21.4* 20.4* 22.0* 25.5* 22.4*  MCV 88.1 88.6 89.1 87.9  89.2  PLT 279 233 221 280 244   Cardiac Enzymes:  Recent Labs Lab 01/26/15 0916  TROPONINI 0.03   CBG:  Recent Labs Lab 02/01/15 0813 02/01/15 1204 02/01/15 1635 02/01/15 2059 02/02/15 0717  GLUCAP 114* 141* 145* 114* 199*    Iron Studies: No results for input(s): IRON, TIBC, TRANSFERRIN, FERRITIN in the last 72 hours. Studies/Results: No results found. Marland Kitchen amLODipine  5 mg Oral BID  . atorvastatin  40 mg Oral q1800  . calcitRIOL  0.25 mcg Oral Daily  . calcium acetate  667 mg Oral TID WC  . darbepoetin (ARANESP) injection - DIALYSIS  60 mcg Intravenous Q Mon-HD  . ferric gluconate (FERRLECIT/NULECIT) IV  125 mg Intravenous Q T,Th,Sa-HD  . heparin  5,000 Units Subcutaneous 3 times per day  . insulin aspart  0-5 Units Subcutaneous QHS  . insulin aspart  0-9 Units Subcutaneous TID WC  . sodium chloride  3 mL Intravenous Q12H    BMET    Component Value Date/Time   NA 127* 02/02/2015 0650   K 4.0 02/02/2015 0650   CL 91* 02/02/2015 0650   CO2 26 02/02/2015 0650   GLUCOSE 217* 02/02/2015 0650   BUN 43* 02/02/2015 0650   CREATININE 7.44* 02/02/2015 0650   CREATININE 1.87* 12/27/2012 0933   CALCIUM 7.6* 02/02/2015 0650   GFRNONAA 7* 02/02/2015 0650   GFRAA 8* 02/02/2015 0650   CBC    Component Value Date/Time   WBC 6.5 02/02/2015 0650   RBC 2.51* 02/02/2015 0650   RBC 4.10* 04/14/2012 1122   HGB 7.5* 02/02/2015  0650   HCT 22.4* 02/02/2015 0650   PLT 244 02/02/2015 0650   MCV 89.2 02/02/2015 0650   MCH 29.9 02/02/2015 0650   MCHC 33.5 02/02/2015 0650   RDW 13.6 02/02/2015 0650   LYMPHSABS 2.0 01/21/2013 1509   MONOABS 0.4 01/21/2013 1509   EOSABS 0.5 01/21/2013 1509   BASOSABS 0.0 01/21/2013 1509     Assessment/Plan:  1. AKI/CKD stage 4 likely ESRD given 2 years without medical care and had underlying poorly controlled DM and HTN with advanced CKD at baseline now with rising Scr and echogenic kidneys on [place]. Received HD on 8/25-8/27. 1. Normal complements, ANA, dsDNA, ASO titer, ANCA, anti-GBM, SPEP/UPEP. Elevated kappa/lambda ratio consistent with ESRD (<3).  2. Needs HD today 3. CM arranged for follow-up with [facility] following discharge. 2. Hyponatremia: Corrected value is 129-130, stable from yesterday. Should correct with dialysis. 3. HTN- stable 4. Pulmonary - bilateral rales, likely pulmonary edema. Will UF with HD and follow CXR and exam 1. Improving with HD and UF 2. Anti-GBM and ANCA pending 5. Anemia of chronic disease- low iron stores, started iron replacement and Aranesp 6. Secondary HPTH- started phoslo and calcitriol will follow ca/phos/iPTH 7. DM type 2 per primary svc 8. CAD- stable 9. Diastolic CHF- stable 10. Vascular access- s/p RIJ TDC and left brachiocephalic AVF 6/56/81 by Dr. Kellie Simmering.  Disposition: Pending CLIP.  Posey Pronto, [place] Shew

## 2015-02-02 NOTE — Progress Notes (Signed)
Family Medicine Teaching Service Daily Progress Note Intern Pager: [phone]  Patient name: Stephen Lane Medical record number: [id number] Date of birth: 1960-10-13 Age: 54 y.o. Gender: male  Primary Care Provider: Phill Myron, MD Consultants: Cardiology, Renal Code Status: Full  Pt Overview and Major Events to Date:  8/23: admitted for SOB, chest pain 8/25: OR for HD cath and AVF, first dialysis 8/26: HD 8/27: HD, anemic to 6.8  Assessment and Plan: Stephen Lane is a 54 y.o. male presenting with shortness of breath x 2 weeks. PMH is significant for CKD, CAD s/p stenting 2013, T2DM, HTN, HLD  # ESRD: SCr improving with HD, 6.11 8/29, Received HD on 8/25-8/27. Etiology of Renal disease likely greatly contributed to by uncontrolled DM and HTN. However, must consider alternate etiologies like Good pastures, Wegener's, microscopic polyangitis, Churg strauss.  GN workup: elevated ESR >140, total complement >60, elevated Kappa/lambda light chains, elevated urine excretion of light chains; nl SPEP, UPEP negative ANA, Hepatitis B/C , anti-dsDNA, ASO, anti-GBM, anti mpo/pr3; pending ANCA - Renal following, appreciate recs - Findings consistent with ESRD - on HD now per renal - Will need outpatient IHD - financial counseling meeting on 8/31 at 9:30 AM - s/p Lasix  # Hyponatremia: Na 127, corrected Na 129, likely secondary to missed dialysis 8/29 - Dialysis today 8/30, anticipate will correct Na  # Metabolic Bone disorder, secondary parahyperthyroidism - Cont calcitriol and phoslo epirically - follow Phos and Ca - 1, 25 dihydroxy vitamin D of 20  # Pulmonary- SOB improved,. SOB was likely secondary worsening renal function and Hfpef - s/p ceftriaxone and azithromycin x1D, d/ced as unlikely infectious picture - supplemental O2 as needed  # HTN: Improved  - will switch norvasc with beta blocker fgiven  - hydralazine IV PRN for SBP >180, DBP >110  # Anemia: Baseline hgb  from 2 years ago around 11, now presents on admission with Hgb 8.6. Likely ACD. Hgb pending for this AM - Iron studies sig for Iron level 14 , TIBC 241, ferritin 194,  c/w ACD - type and screen done - Darbepoeitin, iron supplement  # HfpEF, H/o CAD- Echo shows G2 diastolic dysfunction with focal hypokinesis -  Per cardiology-no need for inpatient intervention. Ok to f/u as outpt - hgb 7.5 today, consider transfusion with dialysis today  # T2DM: Repeat A1c 6.3-- increasing CBGs - CBG monitoring qAC qHS - Will start Lantus 5 units today - SSI - needs outpt diabetic eye exam on discharge  # HLD: lipid panel--> mildly elevated LDL, low HDL - continue atorvastatin  # Decreased Vision: Likely secondary to chronic uncontrolled diabetes and hypertension - Will need Optho visit as outpt  FEN/GI: diet carb mod  Prophylaxis: heparin sq  Disposition: Pending clinical improvement and outpatient HD set-up  Subjective:  Denies SOB, chest pain, states feels better  Objective: Temp:  [98.2 F (36.8 C)-98.6 F (37 C)] 98.3 F (36.8 C) (08/30 0548) Pulse Rate:  [75-77] 77 (08/30 0548) Resp:  [17-18] 18 (08/30 0548) BP: (117-147)/(63-72) 125/63 mmHg (08/30 0548) SpO2:  [98 %-99 %] 99 % (08/30 0548) Physical Exam: General: NAD,sitting in bedside chair Cardiovascular: RRR, no murmurs Respiratory: CTAB, mild cracklesto the mid lung fields, normal WOB Abdomen: soft, non tender, no r/g non distended Extremities: 2+ LE edema,  Non tender, left arm, fistula site with dressing C/d/i  Laboratory:  Recent Labs Lab 01/31/15 0628 02/01/15 0912 02/02/15 0650  WBC 5.0 6.6 6.5  HGB 7.4* 8.6* 7.5*  HCT  22.0* 25.5* 22.4*  PLT 221 280 244    Recent Labs Lab 01/26/15 0916  01/31/15 0628 02/01/15 0334 02/02/15 0650  NA 137  < > 133* 130* 127*  K 4.2  < > 3.7 3.7 4.0  CL 102  < > 97* 93* 91*  CO2 24  < > '28 27 26  ' BUN 60*  < > 17 30* 43*  CREATININE 7.85*  < > 4.34* 6.11* 7.44*  CALCIUM  6.3*  < > 7.6* 7.7* 7.6*  PROT 6.0*  --   --   --   --   BILITOT 0.5  --   --   --   --   ALKPHOS 155*  --   --   --   --   ALT 15*  --   --   --   --   AST 12*  --   --   --   --   GLUCOSE 96  < > 104* 135* 217*  < > = values in this interval not displayed.   Imaging/Diagnostic Tests: Renal US 01/26/15 1. Increased renal parenchymal echogenicity consistent with chronic medical renal disease. No obstructive uropathy. 2. Bilateral pleural effusions, incidentally noted  CXR 01/25/15 Diffuse patchy nodular airspace disease throughout both lungs. Bilateral pleural effusions.  CXR 01/29/15 Heart is enlarged. There has been improvement in diffuse bilateral pulmonary infiltrates bilaterally. Faint patchy densities persist in upper and lower lobes bilaterally. Findings are consistent with improving pulmonary edema. There are small bilateral pleural effusions.   Veatrice Bourbon, MD 02/02/2015, 8:04 AM PGY-2, [place] Intern pager: [phone], text pages welcome

## 2015-02-03 LAB — RENAL FUNCTION PANEL
ALBUMIN: 2.7 g/dL — AB (ref 3.5–5.0)
Anion gap: 14 (ref 5–15)
BUN: 53 mg/dL — AB (ref 6–20)
CO2: 24 mmol/L (ref 22–32)
Calcium: 8 mg/dL — ABNORMAL LOW (ref 8.9–10.3)
Chloride: 92 mmol/L — ABNORMAL LOW (ref 101–111)
Creatinine, Ser: 8.36 mg/dL — ABNORMAL HIGH (ref 0.61–1.24)
GFR calc Af Amer: 7 mL/min — ABNORMAL LOW (ref >60)
GFR, EST NON AFRICAN AMERICAN: 6 mL/min — AB (ref >60)
Glucose, Bld: 105 mg/dL — ABNORMAL HIGH (ref 65–99)
PHOSPHORUS: 5.8 mg/dL — AB (ref 2.5–4.6)
POTASSIUM: 4.2 mmol/L (ref 3.5–5.1)
Sodium: 130 mmol/L — ABNORMAL LOW (ref 135–145)

## 2015-02-03 LAB — GLUCOSE, CAPILLARY
GLUCOSE-CAPILLARY: 109 mg/dL — AB (ref 65–99)
GLUCOSE-CAPILLARY: 92 mg/dL (ref 65–99)
Glucose-Capillary: 119 mg/dL — ABNORMAL HIGH (ref 65–99)
Glucose-Capillary: 138 mg/dL — ABNORMAL HIGH (ref 65–99)

## 2015-02-03 LAB — CBC
HEMATOCRIT: 23.5 % — AB (ref 39.0–52.0)
HEMOGLOBIN: 7.7 g/dL — AB (ref 13.0–17.0)
MCH: 28.8 pg (ref 26.0–34.0)
MCHC: 32.8 g/dL (ref 30.0–36.0)
MCV: 88 fL (ref 78.0–100.0)
Platelets: 272 10*3/uL (ref 150–400)
RBC: 2.67 MIL/uL — ABNORMAL LOW (ref 4.22–5.81)
RDW: 13.5 % (ref 11.5–15.5)
WBC: 7.8 10*3/uL (ref 4.0–10.5)

## 2015-02-03 MED ORDER — DARBEPOETIN ALFA 60 MCG/0.3ML IJ SOSY
PREFILLED_SYRINGE | INTRAMUSCULAR | Status: AC
  Filled 2015-02-03: qty 0.3

## 2015-02-03 MED ORDER — DARBEPOETIN ALFA 60 MCG/0.3ML IJ SOSY
60.0000 ug | PREFILLED_SYRINGE | Freq: Once | INTRAMUSCULAR | Status: DC
  Filled 2015-02-03: qty 0.3

## 2015-02-03 MED ORDER — HEPARIN SODIUM (PORCINE) 1000 UNIT/ML DIALYSIS: 20.0000 [IU]/kg | INTRAMUSCULAR | Status: DC | PRN

## 2015-02-03 NOTE — Progress Notes (Signed)
Patient ID: Stephen Lane, male   DOB: Dec 29, 1960, 54 y.o.   MRN: [id number] S: No complaints this AM. Explained schedule was full yesterday and that he will receive HD today.   O:BP 118/65 mmHg  Pulse 72  Temp(Src) 98.4 F (36.9 C) (Oral)  Resp 20  Ht $R'5\' 3"'OU$  (1.6 m)  Wt 160 lb 4.4 oz (72.7 kg)  BMI 28.40 kg/m2  SpO2 99%  Intake/Output Summary (Last 24 hours) at 02/03/15 0806 Last data filed at 02/03/15 0557  Gross per 24 hour  Intake    720 ml  Output    200 ml  Net    520 ml   Intake/Output: I/O last 3 completed shifts: In: 723 [P.O.:720; I.V.:3] Out: 200 [Urine:200]  Intake/Output this shift:    Weight change:   General: Hispanic male, sitting in chair, NAD Cardiac: RRR, no rubs, murmurs or gallops Pulm: crackles to the mid-lung fields bilaterally, stable from yesterday Abd: soft, nontender, nondistended, BS present Ext: warm and well perfused, 1+ pitting edema bilaterally R>L,  R tunneled IJ, L AVF with palpable thrill Neuro: responds to questions appropriately; moving all extremities freely   Recent Labs Lab 01/28/15 2243 01/29/15 0604 01/30/15 0528 01/31/15 0628 02/01/15 0334 02/02/15 0650 02/03/15 0410  NA 132* 133* 131* 133* 130* 127* 130*  K 4.4 4.0 4.1 3.7 3.7 4.0 4.2  CL 94* 94* 95* 97* 93* 91* 92*  CO2 $Re'24 27 27 28 27 26 24  'rlY$ GLUCOSE 116* 99 126* 104* 135* 217* 105*  BUN 67* 44* 28* 17 30* 43* 53*  CREATININE 8.61* 6.94* 5.75* 4.34* 6.11* 7.44* 8.36*  ALBUMIN 2.4* 2.4* 2.1* 2.1* 2.2* 2.3* 2.7*  CALCIUM 6.4* 6.8* 7.7* 7.6* 7.7* 7.6* 8.0*  PHOS 8.0* 6.5* 5.0* 3.9 4.9* 5.5* 5.8*   Liver Function Tests:  Recent Labs Lab 02/01/15 0334 02/02/15 0650 02/03/15 0410  ALBUMIN 2.2* 2.3* 2.7*   No results for input(s): LIPASE, AMYLASE in the last 168 hours. No results for input(s): AMMONIA in the last 168 hours. CBC:  Recent Labs Lab 01/30/15 0528 01/31/15 0628 02/01/15 0912 02/02/15 0650 02/03/15 0410  WBC 5.0 5.0 6.6 6.5 7.8  HGB 6.8*  7.4* 8.6* 7.5* 7.7*  HCT 20.4* 22.0* 25.5* 22.4* 23.5*  MCV 88.6 89.1 87.9 89.2 88.0  PLT 233 221 280 244 272   Cardiac Enzymes: No results for input(s): CKTOTAL, CKMB, CKMBINDEX, TROPONINI in the last 168 hours. CBG:  Recent Labs Lab 02/02/15 0717 02/02/15 1157 02/02/15 1632 02/02/15 2048 02/03/15 0725  GLUCAP 199* 75 151* 111* 109*    Iron Studies: No results for input(s): IRON, TIBC, TRANSFERRIN, FERRITIN in the last 72 hours. Studies/Results: No results found. Marland Kitchen amLODipine  5 mg Oral BID  . atorvastatin  40 mg Oral q1800  . calcitRIOL  0.25 mcg Oral Daily  . calcium acetate  2,001 mg Oral TID WC  . darbepoetin (ARANESP) injection - DIALYSIS  60 mcg Intravenous Q Tue-HD  . ferric gluconate (FERRLECIT/NULECIT) IV  125 mg Intravenous Q T,Th,Sa-HD  . heparin  5,000 Units Subcutaneous 3 times per day  . insulin aspart  0-5 Units Subcutaneous QHS  . insulin aspart  0-9 Units Subcutaneous TID WC  . insulin glargine  5 Units Subcutaneous QHS  . sodium chloride  3 mL Intravenous Q12H    BMET    Component Value Date/Time   NA 130* 02/03/2015 0410   K 4.2 02/03/2015 0410   CL 92* 02/03/2015 0410   CO2 24 02/03/2015  0410   GLUCOSE 105* 02/03/2015 0410   BUN 53* 02/03/2015 0410   CREATININE 8.36* 02/03/2015 0410   CREATININE 1.87* 12/27/2012 0933   CALCIUM 8.0* 02/03/2015 0410   GFRNONAA 6* 02/03/2015 0410   GFRAA 7* 02/03/2015 0410   CBC    Component Value Date/Time   WBC 7.8 02/03/2015 0410   RBC 2.67* 02/03/2015 0410   RBC 4.10* 04/14/2012 1122   HGB 7.7* 02/03/2015 0410   HCT 23.5* 02/03/2015 0410   PLT 272 02/03/2015 0410   MCV 88.0 02/03/2015 0410   MCH 28.8 02/03/2015 0410   MCHC 32.8 02/03/2015 0410   RDW 13.5 02/03/2015 0410   LYMPHSABS 2.0 01/21/2013 1509   MONOABS 0.4 01/21/2013 1509   EOSABS 0.5 01/21/2013 1509   BASOSABS 0.0 01/21/2013 1509     Assessment/Plan:  1. AKI/CKD stage 4 likely ESRD given 2 years without medical care and had  underlying poorly controlled DM and HTN with advanced CKD at baseline now with rising Scr and echogenic kidneys on [place]. Received HD on 8/25-8/27. 1. Normal complements, ANA, dsDNA, ASO titer, ANCA, anti-GBM, SPEP. UPEP with elevated light chain excretion and elevated serum kappa/lambda ratio consistent with ESRD as it's <3.  2. Will need HD today 3. CM arranged for follow-up with [facility] following discharge. 2. Hyponatremia: Na 130, stable from yesterday, b/c he did not get HD. Likely from mild hypervolemia. 3. HTN- stable 4. Pulmonary - bilateral rales, likely pulmonary edema. Will UF with HD and follow CXR and exam 1. Improving with HD and UF 5. Anemia of chronic disease- low iron stores, started iron replacement and Aranesp 6. Secondary HPTH- started phoslo and calcitriol will follow ca/phos/iPTH 7. DM type 2 per primary svc 8. CAD- stable 9. Diastolic CHF- stable 10. Vascular access- s/p RIJ TDC and left brachiocephalic AVF 0/34/03 by Dr. Kellie Simmering.  Disposition: Pending CLIP.  Posey Pronto, Nicloe Frontera

## 2015-02-03 NOTE — Progress Notes (Signed)
Placed call to telephonic interpreter, [place], [phone]. Pt educated on Lantus, medication administration, and diet. Capable of re verbalizing understanding. Dorthey Sawyer, RN

## 2015-02-03 NOTE — Procedures (Addendum)
I was present at this dialysis session. I have reviewed the session itself and made appropriate changes. Doingw ell.  TDC. BP ok.  AVF +B/T  Pt CLIP to [facility] [employer].  Plan for HD 9/1, then able for discharge.    Pearson Grippe  MD 02/03/2015, 2:43 PM

## 2015-02-03 NOTE — Progress Notes (Signed)
02/03/2015 4:17 PM Hemodialysis Outpatient Note; this patient has been accepted at the [place] center on a Tuesday, Thursday and Saturday 2nd shift schedule.The patient can report to the center on Saturday at 11:30 AM and sign paperwork and consents or stop by the center on Friday sometime before 2:00PM to sign paperwork. I have asked the daughter to accompany the patient to act as an interpreter. Thank you. Gordy Savers

## 2015-02-03 NOTE — Progress Notes (Signed)
02/03/2015 10:07 AM Clinical social worker and interpreter at bedside, meeting with pt. Dola Argyle, RN

## 2015-02-03 NOTE — Progress Notes (Signed)
Family Medicine Teaching Service Daily Progress Note Intern Pager: [phone]  Patient name: Stephen Lane Medical record number: [id number] Date of birth: November 16, 1960 Age: 54 y.o. Gender: male  Primary Care Provider: Phill Myron, MD Consultants: Cardiology, Renal Code Status: Full  Pt Overview and Major Events to Date:  8/23: admitted for SOB, chest pain 8/25: OR for HD cath and AVF, first dialysis 8/26: HD 8/27: HD, anemic to 6.8  Assessment and Plan: Stephen Lane is a 54 y.o. male presenting with shortness of breath x 2 weeks. PMH is significant for CKD, CAD s/p stenting 2013, T2DM, HTN, HLD  # ESRD: , Received HD on 8/25-8/27. Etiology of Renal disease likely greatly contributed to by uncontrolled DM and HTN. However, must consider alternate etiologies like Good pastures, Wegener's, microscopic polyangitis, Churg strauss.  GN workup: elevated ESR >140, total complement >60, elevated Kappa/lambda light chains, elevated urine excretion of light chains likely elevated in chronic illness and now ESRD; nl SPEP, UPEP negative ANA, Hepatitis B/C , anti-dsDNA, ASO, anti-GBM, anti mpo/pr3; nl ANCA - Renal following, appreciate recs - Findings consistent with ESRD - on HD now per renal - Will need outpatient IHD - financial counseling meeting on 8/31 at 9:30 AM, today - s/p Lasix  # Hyponatremia: Na 130 likely secondary to missed dialysis - Dialysis today 8/31, anticipate will correct Na  # Metabolic Bone disorder, secondary parahyperthyroidism - Cont calcitriol and phoslo epirically - follow Phos and Ca - 1, 25 dihydroxy vitamin D of 20  # Pulmonary- SOB improved,. SOB was likely secondary worsening renal function and Hfpef - s/p ceftriaxone and azithromycin x1D, d/ced as unlikely infectious picture - supplemental O2 as needed  # HTN: Improved  - continue home beta blocker  - hydralazine IV PRN for SBP >180, DBP >110  # Anemia: Hgb 7.7 this AM, likely contributed  to by ACD - Iron studies sig for Iron level 14 , TIBC 241, ferritin 194,  c/w ACD - type and screen done - Darbepoeitin, iron supplement  # HfpEF, H/o CAD- Echo shows G2 diastolic dysfunction with focal hypokinesis -  Per cardiology-no need for inpatient intervention. Ok to f/u as outpt - hgb 7.5 today, consider transfusion with dialysis today  # T2DM: Repeat A1c 6.3 improved CBGs with lantus - CBG monitoring qAC qHS - continue Lantus 5 units today - SSI - needs outpt diabetic eye exam on discharge  # HLD: lipid panel--> mildly elevated LDL, low HDL - continue atorvastatin  # Decreased Vision: Likely secondary to chronic uncontrolled diabetes and hypertension - Will need Optho visit as outpt  FEN/GI: diet carb mod  Prophylaxis: heparin sq  Disposition: Pending clinical improvement and outpatient HD set-up  Subjective:  Denies SOB, chest pain, states feels better  Objective: Temp:  [97.9 F (36.6 C)-98.4 F (36.9 C)] 98.4 F (36.9 C) (08/31 0437) Pulse Rate:  [72-75] 72 (08/31 0437) Resp:  [17-20] 20 (08/31 0437) BP: (117-132)/(65-68) 118/65 mmHg (08/31 0437) SpO2:  [98 %-99 %] 99 % (08/31 0437) Weight:  [160 lb 4.4 oz (72.7 kg)] 160 lb 4.4 oz (72.7 kg) (08/30 2049) Physical Exam: General: NAD,sitting in bedside chair Cardiovascular: RRR, no murmurs Respiratory: CTAB, mild cracklesto the mid lung fields, normal WOB Abdomen: soft, non tender, no r/g non distended Extremities: 2+ LE edema,  Non tender, left arm, fistula site with dressing C/d/i  Laboratory:  Recent Labs Lab 02/01/15 0912 02/02/15 0650 02/03/15 0410  WBC 6.6 6.5 7.8  HGB 8.6* 7.5* 7.7*  HCT 25.5* 22.4* 23.5*  PLT 280 244 272    Recent Labs Lab 02/01/15 0334 02/02/15 0650 02/03/15 0410  NA 130* 127* 130*  K 3.7 4.0 4.2  CL 93* 91* 92*  CO2 _0 BUN 30* 43* 53*  CREATININE 6.11* 7.44* 8.36*  CALCIUM 7.7* 7.6* 8.0*  GLUCOSE 135* 217* 105*     Imaging/Diagnostic Tests: Renal  US 01/26/15 1. Increased renal parenchymal echogenicity consistent with chronic medical renal disease. No obstructive uropathy. 2. Bilateral pleural effusions, incidentally noted  CXR 01/25/15 Diffuse patchy nodular airspace disease throughout both lungs. Bilateral pleural effusions.  CXR 01/29/15 Heart is enlarged. There has been improvement in diffuse bilateral pulmonary infiltrates bilaterally. Faint patchy densities persist in upper and lower lobes bilaterally. Findings are consistent with improving pulmonary edema. There are small bilateral pleural effusions.   Veatrice Bourbon, MD 02/03/2015, 9:38 AM PGY-2, [place] Intern pager: [phone], text pages welcome

## 2015-02-04 LAB — RENAL FUNCTION PANEL
ANION GAP: 9 (ref 5–15)
Albumin: 2.6 g/dL — ABNORMAL LOW (ref 3.5–5.0)
BUN: 23 mg/dL — ABNORMAL HIGH (ref 6–20)
CHLORIDE: 97 mmol/L — AB (ref 101–111)
CO2: 29 mmol/L (ref 22–32)
Calcium: 8.1 mg/dL — ABNORMAL LOW (ref 8.9–10.3)
Creatinine, Ser: 5.01 mg/dL — ABNORMAL HIGH (ref 0.61–1.24)
GFR calc non Af Amer: 12 mL/min — ABNORMAL LOW (ref >60)
GFR, EST AFRICAN AMERICAN: 14 mL/min — AB (ref >60)
Glucose, Bld: 91 mg/dL (ref 65–99)
Phosphorus: 3.8 mg/dL (ref 2.5–4.6)
Potassium: 4.3 mmol/L (ref 3.5–5.1)
Sodium: 135 mmol/L (ref 135–145)

## 2015-02-04 LAB — CBC
HCT: 25.2 % — ABNORMAL LOW (ref 39.0–52.0)
HEMOGLOBIN: 8.2 g/dL — AB (ref 13.0–17.0)
MCH: 29.3 pg (ref 26.0–34.0)
MCHC: 32.5 g/dL (ref 30.0–36.0)
MCV: 90 fL (ref 78.0–100.0)
Platelets: 252 10*3/uL (ref 150–400)
RBC: 2.8 MIL/uL — AB (ref 4.22–5.81)
RDW: 14.1 % (ref 11.5–15.5)
WBC: 5.8 10*3/uL (ref 4.0–10.5)

## 2015-02-04 LAB — GLUCOSE, CAPILLARY: Glucose-Capillary: 108 mg/dL — ABNORMAL HIGH (ref 65–99)

## 2015-02-04 MED ORDER — ATORVASTATIN CALCIUM 40 MG PO TABS: 40.0000 mg | ORAL_TABLET | Freq: Every day | ORAL | Status: DC

## 2015-02-04 MED ORDER — OXYCODONE-ACETAMINOPHEN 5-325 MG PO TABS: 1.0000 | ORAL_TABLET | Freq: Four times a day (QID) | ORAL | Status: DC | PRN

## 2015-02-04 MED ORDER — INSULIN GLARGINE 100 UNIT/ML ~~LOC~~ SOLN: 5.0000 [IU] | Freq: Every day | SUBCUTANEOUS | Status: DC

## 2015-02-04 MED ORDER — CALCIUM ACETATE (PHOS BINDER) 667 MG PO CAPS: 2001.0000 mg | ORAL_CAPSULE | Freq: Three times a day (TID) | ORAL | Status: DC

## 2015-02-04 MED ORDER — CALCITRIOL 0.25 MCG PO CAPS: 0.2500 ug | ORAL_CAPSULE | Freq: Every day | ORAL | Status: DC

## 2015-02-04 NOTE — Progress Notes (Signed)
Received call from [facility] and spoke with [place].She wants to know who the authorizing physician is because the MD who sent prescription earlier,her name is not on the list on patient's card. Call made to on call MD and notified of the call.MD to call [facility]. Hollister Wessler, Wonda Cheng, Therapist, sports

## 2015-02-04 NOTE — Progress Notes (Signed)
Hemodialysis nurse notified that patient no need to be held in hospital to give ferric gluconate per Dr. Joelyn Oms and will be taken care as an outpatient.

## 2015-02-04 NOTE — Progress Notes (Signed)
Hemodialysis- Pt tolerated treatment well. Did not receive ferric gluconate. Dr. Joelyn Oms notified, ok to discharge and will resume this medication outpatient, no need to hold to give. Primary RN notified.

## 2015-02-04 NOTE — Care Management Note (Signed)
Case Management Note  Patient Details  Name: Stephen Lane MRN: [id number] Date of Birth: 1960/08/24  Subjective/Objective:           CM following for progression and d/c planning.         Action/Plan: [place] letter given and pt will be followed by family practice in their clinic.   Expected Discharge Date:       02/04/2015           Expected Discharge Plan:  Home/Self Care  In-House Referral:  Clinical Social Work  Discharge planning Services  CM Consult, [facility] Program  Post Acute Care Choice:    Choice offered to:     DME Arranged:    DME Agency:     HH Arranged:    [place] Agency:     Status of Service:  Completed, signed off  Medicare Important Message Given:    Date Medicare IM Given:    Medicare IM give by:    Date Additional Medicare IM Given:    Additional Medicare Important Message give by:     If discussed at [place] of Stay Meetings, dates discussed:    Additional Comments:  Adron Bene, RN 02/04/2015, 1:34 PM

## 2015-02-04 NOTE — Progress Notes (Signed)
Family Medicine Teaching Service Daily Progress Note Intern Pager: [phone]  Patient name: Stephen Lane Medical record number: [id number] Date of birth: [birth year] Age: 54 y.o. Gender: male  Primary Care Provider: Phill Myron, MD Consultants: Cardiology, Renal Code Status: Full  Pt Overview and Major Events to Date:  8/23: admitted for SOB, chest pain 8/25: OR for HD cath and AVF, first dialysis 8/26: HD 8/27: HD, anemic to 6.8  Assessment and Plan: Stephen Lane is a 54 y.o. male presenting with shortness of breath x 2 weeks. PMH is significant for CKD, CAD s/p stenting 2013, T2DM, HTN, HLD  # ESRD: , Received HD on 8/25-8/27. Etiology of Renal disease likely greatly contributed to by uncontrolled DM and HTN. However, must consider alternate etiologies like Good pastures, Wegener's, microscopic polyangitis, Churg strauss.  GN workup: elevated ESR >140, total complement >60, elevated Kappa/lambda light chains, elevated urine excretion of light chains likely elevated in chronic illness and now ESRD; nl SPEP, UPEP negative ANA, Hepatitis B/C , anti-dsDNA, ASO, anti-GBM, anti mpo/pr3; nl ANCA - Renal following, appreciate recs - Findings consistent with ESRD - on HD now per renal - Out pt HD schedule established, will follow with [facility]  # Hyponatremia: Na 135 resolved post dialysis - Dialysis today 9/1  # Metabolic Bone disorder, secondary parahyperthyroidism - Cont calcitriol and phoslo epirically - follow Phos and Ca, improving - 1, 25 dihydroxy vitamin D of 20  # Pulmonary- SOB improved,. SOB was likely secondary worsening renal function and Hfpef - s/p ceftriaxone and azithromycin x1D, d/ced as unlikely infectious picture - supplemental O2 as needed  # HTN: Improved  - continue home beta blocker  - hydralazine IV PRN for SBP >180, DBP >110  # Anemia: Hgb 7.7 this AM, likely contributed to by ACD - Iron studies sig for Iron level 14 , TIBC 241,  ferritin 194,  c/w ACD - type and screen done - Darbepoeitin, iron supplement  # HfpEF, H/o CAD- Echo shows G2 diastolic dysfunction with focal hypokinesis -  Per cardiology-no need for inpatient intervention. Ok to f/u as outpt - hgb 7.5 today, consider transfusion with dialysis today  # T2DM: Repeat A1c 6.3 improved CBGs with lantus - CBG monitoring qAC qHS - continue Lantus 5 units today - SSI - needs outpt diabetic eye exam on discharge  # HLD: lipid panel--> mildly elevated LDL, low HDL - continue atorvastatin  # Decreased Vision: Likely secondary to chronic uncontrolled diabetes and hypertension - Will need Optho visit as outpt  FEN/GI: diet carb mod  Prophylaxis: heparin sq  Disposition: Pending clinical improvement and outpatient HD set-up  Subjective:  Denies SOB, chest pain, states feels better  Objective: Temp:  [98.1 F (36.7 C)-98.4 F (36.9 C)] 98.2 F (36.8 C) (09/01 0754) Pulse Rate:  [71-80] 77 (09/01 0926) Resp:  [13-19] 19 (09/01 0754) BP: (104-137)/(57-74) 127/66 mmHg (09/01 0926) SpO2:  [97 %-98 %] 98 % (09/01 0754) Weight:  [148 lb 2.4 oz (67.2 kg)-154 lb 15.7 oz (70.3 kg)] 149 lb 14.6 oz (68 kg) (09/01 0754) Physical Exam: General: NAD, in dialysis Cardiovascular: RRR, no murmurs Respiratory: CTAB, normal WOB Abdomen: soft, non tender, no r/g non distended Extremities: 2+ LE edema,  Non tender, left arm, fistula site with dressing C/d/i  Laboratory:  Recent Labs Lab 02/02/15 0650 02/03/15 0410 02/04/15 0633  WBC 6.5 7.8 5.8  HGB 7.5* 7.7* 8.2*  HCT 22.4* 23.5* 25.2*  PLT 244 272 252    Recent Labs  Lab 02/02/15 0650 02/03/15 0410 02/04/15 0633  NA 127* 130* 135  K 4.0 4.2 4.3  CL 91* 92* 97*  CO2 _0 BUN 43* 53* 23*  CREATININE 7.44* 8.36* 5.01*  CALCIUM 7.6* 8.0* 8.1*  GLUCOSE 217* 105* 91     Imaging/Diagnostic Tests: Renal US 01/26/15 1. Increased renal parenchymal echogenicity consistent with chronic medical  renal disease. No obstructive uropathy. 2. Bilateral pleural effusions, incidentally noted  CXR 01/25/15 Diffuse patchy nodular airspace disease throughout both lungs. Bilateral pleural effusions.  CXR 01/29/15 Heart is enlarged. There has been improvement in diffuse bilateral pulmonary infiltrates bilaterally. Faint patchy densities persist in upper and lower lobes bilaterally. Findings are consistent with improving pulmonary edema. There are small bilateral pleural effusions.   Veatrice Bourbon, MD 02/04/2015, 9:58 AM PGY-2, [place] Intern pager: [phone], text pages welcome

## 2015-02-04 NOTE — Discharge Instructions (Signed)
You were admitted for kidney failure. Please go to dialysis at [facility] on 9/3. You have follow up as below:  Follow-up Information    Follow up with Tinnie Gens, MD In 6 weeks.   Specialties:  Vascular Surgery, Interventional Cardiology, Cardiology   Why:  Our office will call you to arrange an appointment (sent)   Contact information:   [place] [address] [phone]       Follow up with Lockie Pares, MD On 02/09/2015.   Specialty:  Family Medicine   Why:  11:00 am   Contact information:   [place] [address] [phone]

## 2015-02-04 NOTE — Progress Notes (Signed)
Patient Discharge: Disposition: Patient discharged to home with daughters. Education: Reviewed the discharge summary, medications, prescriptions, and follow-up appointments.  Discharge summary printed in both [place] and [place].  Match letter for the prescription given, understood and acknowledged. IV: Discontinued IV before discharge. Telemetry: Discontinued before discharge.  CCMD notified. Transportation: Patient walked out of the unit with daughters, didn't want w/c. Belongings: patient took all his belongings with him.

## 2015-02-04 NOTE — Procedures (Signed)
I was present at this dialysis session. I have reviewed the session itself and made appropriate changes.   No issues.  For DC today, knows to go for paperwork tomorrow or Sat in AM.  NExt HD as outpt Saturday  Pearson Grippe  MD 02/04/2015, 11:27 AM

## 2015-02-09 ENCOUNTER — Ambulatory Visit: Payer: Self-pay | Admitting: Internal Medicine

## 2015-02-09 ENCOUNTER — Inpatient Hospital Stay: Payer: Self-pay | Admitting: Family Medicine

## 2015-02-09 DIAGNOSIS — R918 Other nonspecific abnormal finding of lung field: Secondary | ICD-10-CM | POA: Insufficient documentation

## 2015-02-15 ENCOUNTER — Ambulatory Visit (INDEPENDENT_AMBULATORY_CARE_PROVIDER_SITE_OTHER): Payer: Self-pay | Admitting: Internal Medicine

## 2015-02-15 ENCOUNTER — Encounter: Payer: Self-pay | Admitting: Internal Medicine

## 2015-02-15 ENCOUNTER — Telehealth: Payer: Self-pay | Admitting: Family Medicine

## 2015-02-15 VITALS — BP 178/84 | HR 85 | Temp 98.3°F | Ht 63.0 in | Wt 145.5 lb

## 2015-02-15 DIAGNOSIS — E1122 Type 2 diabetes mellitus with diabetic chronic kidney disease: Secondary | ICD-10-CM

## 2015-02-15 DIAGNOSIS — I509 Heart failure, unspecified: Secondary | ICD-10-CM

## 2015-02-15 DIAGNOSIS — I10 Essential (primary) hypertension: Secondary | ICD-10-CM

## 2015-02-15 DIAGNOSIS — N186 End stage renal disease: Secondary | ICD-10-CM

## 2015-02-15 DIAGNOSIS — E785 Hyperlipidemia, unspecified: Secondary | ICD-10-CM

## 2015-02-15 DIAGNOSIS — I5030 Unspecified diastolic (congestive) heart failure: Secondary | ICD-10-CM

## 2015-02-15 DIAGNOSIS — N189 Chronic kidney disease, unspecified: Secondary | ICD-10-CM

## 2015-02-15 MED ORDER — GLIPIZIDE 5 MG PO TABS
5.0000 mg | ORAL_TABLET | Freq: Two times a day (BID) | ORAL | Status: DC
Start: 2015-02-15 — End: 2015-02-22

## 2015-02-15 MED ORDER — AMLODIPINE BESYLATE 5 MG PO TABS: 5.0000 mg | ORAL_TABLET | Freq: Every day | ORAL | Status: DC

## 2015-02-15 MED ORDER — INSULIN GLARGINE 100 UNIT/ML ~~LOC~~ SOLN: 5.0000 [IU] | Freq: Every evening | SUBCUTANEOUS | Status: DC | PRN

## 2015-02-15 NOTE — Telephone Encounter (Signed)
Pt calling about meds that were prescribed today, is not at the pharmacy yet and pt believes one medication was sent in error

## 2015-02-15 NOTE — Assessment & Plan Note (Signed)
ESRD  - Continue T/TH/Sa dialysis  - Patient has appointment with nephrology every Tuesday before dialysis

## 2015-02-15 NOTE — Progress Notes (Signed)
[facility] Kerrin Mo, MD Phone: [phone]  Subjective:   # Diabetes. Patient takes 5 units of lantus once a day in the afternoon around 5-6pm. However, does not take Lantus every day consistently. Blood sugars in morning are in the 80s. In the afternoon blood sugars are about 102.  Patient's highest blood sugar is 200s. Patient had not taken his Lantus the day before when he had this blood sugar level. That night the patient then 10 units of Lantus that night, and following morning had blood sugar of 79.   ESRD - T, TH, Sa.  No issues with getting to dialysis appointments. Feeling better overall. Patient states that he see his nephrologist.   Cardiology- Patient is waiting for cardiology to call to make an appointment.   All relevant systems were reviewed and were negative unless otherwise noted in the HPI  Past Medical History Reviewed problem list.  Medications- reviewed and updated Current Outpatient Prescriptions  Medication Sig Dispense Refill  . amLODipine (NORVASC) 5 MG tablet Take 1 tablet (5 mg total) by mouth daily. 90 tablet 3  . aspirin EC 81 MG EC tablet Take 1 tablet (81 mg total) by mouth daily. (Patient not taking: Reported on 01/25/2015)    . atorvastatin (LIPITOR) 40 MG tablet Take 1 tablet (40 mg total) by mouth daily at 6 PM. 30 tablet 5  . calcitRIOL (ROCALTROL) 0.25 MCG capsule Take 1 capsule (0.25 mcg total) by mouth daily. 30 capsule 3  . calcium acetate (PHOSLO) 667 MG capsule Take 3 capsules (2,001 mg total) by mouth 3 (three) times daily with meals. 30 capsule 3  . glipiZIDE (GLUCOTROL) 5 MG tablet Take 1 tablet (5 mg total) by mouth 2 (two) times daily before a meal. 60 tablet 0  . insulin glargine (LANTUS) 100 UNIT/ML injection Inject 0.05 mLs (5 Units total) into the skin at bedtime as needed (Take 5 units is blood sugar level at night is greater than 200.). 10 mL 11  . oxyCODONE-acetaminophen (ROXICET) 5-325 MG per tablet Take 1  tablet by mouth every 6 (six) hours as needed for severe pain. 20 tablet 0   No current facility-administered medications for this visit.   Chief complaint-noted No additions to family history Social history- patient is a non-smoker  Objective: BP 178/84 mmHg  Pulse 85  Temp(Src) 98.3 F (36.8 C) (Oral)  Ht $R'5\' 3"'gr$  (1.6 m)  Wt 145 lb 8 oz (65.998 kg)  BMI 25.78 kg/m2 Gen: NAD, alert, cooperative with exam Neck: FROM, supple CV: RRR, good S1/S2, no murmur, cap refill <3 Resp: CTABL, no wheezes, non-labored Ext: No edema, warm, normal tone, moves UE/LE spontaneously Neuro: Alert and oriented, No gross deficits Skin: no rashes no lesions  Assessment/Plan: See problem based a/p  Hypertension, essential Hypertension. Patient's blood pressure is elevated. Not currently taking an medications for blood pressure. No headaches or blurry vision.  - Restart patient's Norvasc 5 mg once daily - Patient to come in for a blood pressure check in a week    Type 2 diabetes mellitus with diabetic chronic kidney disease Type 2 DM, Hgb A1C 6.3 on 8/23. Patient eating carbohydrate with almost every meal.  - Patient to d/c daily Lantus  - Start Glipizide 5 mg twice daily  - PRN Lantus only when blood sugars in the afternoon are over 200, then can take 5 units of lantus at night.  - Will need to see patient in 2 weeks for diabetes check  -  Return precautions including low blood sugars, eating meals the administration of sulfonylurea    ESRD (end stage renal disease) ESRD  - Continue T/TH/Sa dialysis  - Patient has appointment with nephrology every Tuesday before dialysis

## 2015-02-15 NOTE — Progress Notes (Deleted)
Patient ID: Stephen Lane, male   DOB: [birth year], 54 y.o.   MRN: [id number]     [facility] Kerrin Mo, MD Phone: [phone]  Subjective:   # Hospital Follow up -  1. Consider outpatient cardiology follow up 2. Ensure compliance with dialysis schedule 3. Consider started an oral agent for diabetes All relevant systems were reviewed and were negative unless otherwise noted in the HPI  Past Medical History Reviewed problem list.  Medications- reviewed and updated Current Outpatient Prescriptions  Medication Sig Dispense Refill  . amLODipine (NORVASC) 5 MG tablet Take 1 tablet (5 mg total) by mouth daily. (Patient not taking: Reported on 01/25/2015) 90 tablet 3  . aspirin EC 81 MG EC tablet Take 1 tablet (81 mg total) by mouth daily. (Patient not taking: Reported on 01/25/2015)    . atorvastatin (LIPITOR) 40 MG tablet Take 1 tablet (40 mg total) by mouth daily at 6 PM. 30 tablet 5  . calcitRIOL (ROCALTROL) 0.25 MCG capsule Take 1 capsule (0.25 mcg total) by mouth daily. 30 capsule 3  . calcium acetate (PHOSLO) 667 MG capsule Take 3 capsules (2,001 mg total) by mouth 3 (three) times daily with meals. 30 capsule 3  . hydrochlorothiazide (HYDRODIURIL) 25 MG tablet Take 1 tablet (25 mg total) by mouth daily. (Patient not taking: Reported on 01/25/2015) 30 tablet 11  . insulin glargine (LANTUS) 100 UNIT/ML injection Inject 0.05 mLs (5 Units total) into the skin at bedtime. 10 mL 11  . lisinopril (PRINIVIL,ZESTRIL) 40 MG tablet Take 1 tablet (40 mg total) by mouth daily. (Patient not taking: Reported on 01/25/2015) 30 tablet 11  . oxyCODONE-acetaminophen (ROXICET) 5-325 MG per tablet Take 1 tablet by mouth every 6 (six) hours as needed for severe pain. 20 tablet 0  . pravastatin (PRAVACHOL) 40 MG tablet Take 1 tablet (40 mg total) by mouth daily. (Patient not taking: Reported on 01/25/2015) 30 tablet 11   No current facility-administered medications for this  visit.   Chief complaint-noted No additions to family history Social history- patient is a *** smoker  Objective: BP 186/86 mmHg  Pulse 85  Temp(Src) 98.3 F (36.8 C) (Oral)  Ht $R'5\' 3"'Eu$  (1.6 m)  Wt 145 lb 8 oz (65.998 kg)  BMI 25.78 kg/m2 Gen: NAD, alert, cooperative with exam HEENT: NCAT, EOMI, PERRL, TMs nml Neck: FROM, supple CV: RRR, good S1/S2, no murmur, cap refill <3 Resp: CTABL, no wheezes, non-labored Abd: SNTND, BS present, no guarding or organomegaly Ext: No edema, warm, normal tone, moves UE/LE spontaneously Neuro: Alert and oriented, No gross deficits Skin: no rashes no lesions  Assessment/Plan: See problem based a/p

## 2015-02-15 NOTE — Assessment & Plan Note (Signed)
Hypertension. Patient's blood pressure is elevated. Not currently taking an medications for blood pressure. No headaches or blurry vision.  - Restart patient's Norvasc 5 mg once daily - Patient to come in for a blood pressure check in a week

## 2015-02-15 NOTE — Assessment & Plan Note (Signed)
Diastolic Heart Failure - Patient still waiting for appointment  - If no appointment in two weeks, will send in a referral to cardiology

## 2015-02-15 NOTE — Patient Instructions (Addendum)
Continue to Check blood sugars twice a day, once in the morning before breakfast and once at night before dinner. Please start taking the Glipizide twice a day. If blood sugar in the afternoon is greater than 200, you can take 5 units of Lantus. Please start taking Norvasc as well for your blood pressure. Please make sure to follow up with your cardiologist and nephrologist. Please follow up with [place] in 2 weeks   Hipoglucemia (Hypoglycemia) La hipoglucemia se produce cuando el nivel de glucosa en la sangre es demasiado bajo. La glucosa es un tipo de azcar, que es la principal fuente de energa del cuerpo. Hormonas, como la insulina y Secretary/administrator, Probation officer el nivel de glucosa en la [place]. La insulina reduce el nivel de la glucosa en la sangre, mientras que el glucagn lo [place]. Si tiene demasiada insulina en el torrente sanguneo o si no ingiere suficientes alimentos que contengan azcar, puede desarrollar hipoglucemia. Esta afeccin puede manifestarse en personas con o sin diabetes. Puede desarrollarse rpidamente y, como consecuencia, necesitar atencin urgente.  CAUSAS   Omitir o retrasar comidas.  No ingerir demasiados carbohidratos en las comidas.  Consumo excesivo de medicamentos para la diabetes.  No coordinar el horario de la toma de medicamentos por va oral para la diabetes o de insulina, con las comidas, las colaciones y Adult nurse.  Nuseas y vmitos.  Algunos medicamentos.  Enfermedades graves, como hepatitis, trastornos renales y ciertos trastornos de Youth worker.  Aumento de la actividad fsica o el ejercicio, sin ingerir alimentos adicionales o ajustar los medicamentos.  Beber alcohol en exceso.  Un trastorno nervioso que afecta las funciones corporales, como la frecuencia cardaca, presin arterial y digestin (neuropata [place]).  Una afeccin en la cual los msculos del estmago no funcionan apropiadamente (gastroparesia). Por consiguiente, los medicamentos y  los alimentos no pueden absorberse Personal assistant.  Pocas veces un tumor de pncreas puede producir demasiada insulina. SNTOMAS   Hambre.  Sudoraciones (diaforesis).  Cambio en la Firefighter.  Temblores.  Dolor de [place].  Ansiedad.  Aturdimiento.  Irritabilidad.  Dificultad para concentrarse.  Sequedad en la boca.  Hormigueo o adormecimiento de las manos y los pies.  Sueo agitado o alteraciones del sueo.  Alteracin en el habla y la coordinacin.  Cambio en el estado mental.  Convulsiones breves o prolongadas.  Agresividad  Somnolencia (letargo).  Debilidad.  Aumento de la frecuencia cardaca o palpitaciones.  Confusin.  Piel plida o de [employer].  Visin borrosa o doble.  Desmayos. DIAGNSTICO  Le harn un examen fsico y [place] historia clnica. Su mdico puede hacer un diagnstico en funcin de sus sntomas. Pueden realizarle anlisis de sangre y otras pruebas de laboratorio para Physicist, medical diagnstico. Una vez realizado el diagnstico, su mdico observar si los signos y sntomas desaparecen, una vez que aumenta el nivel de la glucosa en la [place].  TRATAMIENTO  Por lo general, la hipoglucemia puede tratarse fcilmente cuando se observan sntomas.  Controle su nivel de glucosa en la sangre. Si es menor que 70 mg/dl, tome uno de los siguientes:  3 o 4 comprimidos de glucosa.   taza de jugo.   taza de una gaseosa comn.  [place]   a 1 pomo de glucosa en gel.  5 a 6 caramelos duros.  Evite las bebidas o los alimentos con alto contenido de grasa, que pueden retrasar el aumento de los niveles de glucosa en la [place].  No ingiera ms de la cantidad recomendada  de alimentos, bebidas, gel o comprimidos que contengan azcar. Si lo hace, el nivel de glucosa en la sangre subir demasiado.  Espere de 10 a 15 minutos y vuelva a Chief Technology Officer su nivel de glucosa en la sangre. Si an es Scientist, product/process development 70 mg/dl o est por debajo  del intervalo indicado, repita el tratamiento.  Ingiera una colacin si falta ms de 1 hora para su prxima comida. Es posible que, alguna vez, su nivel de glucosa en la sangre baje [place], de modo que no pueda tratarse en su casa, cuando comience a observar los sntomas. Probablemente necesite ayuda. Incluso puede desmayarse o ser incapaz de tragar. Si no puede tratarse por s solo, alguien Software engineer al hospital.  INSTRUCCIONES PARA EL CUIDADO EN EL HOGAR  Si tiene diabetes, siga su plan de control de la diabetes:  Tome los medicamentos segn las indicaciones.  Siga el plan de ejercicio.  Siga el plan de comidas. No saltee comidas. Coma a horario.  Controle su nivel de glucosa en la sangre peridicamente. Controle su nivel de glucosa en la sangre antes y despus de ejercitarse. Si hace ejercicio durante ms tiempo o de [employer] de lo habitual, asegrese de Chief Technology Officer su nivel de glucosa en la sangre con mayor frecuencia.  Use su pulsera o medalla de alerta mdica, que indica que usted tiene diabetes.  Identifique la causa de su hipoglucemia. Luego, desarrolle formas de prevenir la recurrencia de la hipoglucemia.  No tome un bao o una ducha caliente inmediatamente despus de una inyeccin de insulina.  Siempre lleve [employer]. Las pastillas de glucosa son fciles de Catering manager.  Si va a beber alcohol, bbalo solo con las comidas.  Informe a familiares y amigos qu pueden hacer para mantenerlo seguro durante una convulsin. Esto puede incluir retirar [employer] duros o filosos del rea o colocarlo de costado.  Mantenga un peso saludable. SOLICITE ATENCIN MDICA SI:   Tiene problemas para Advertising account executive de glucosa en la sangre dentro del intervalo indicado.  Tiene episodios frecuentes de hipoglucemia.  Siente efectos secundarios por los medicamentos prescritos.  No est seguro por qu su nivel de glucosa en la sangre es tan bajo.  Nota cambios o un  nuevo problema en la visin . SOLICITE ATENCIN MDICA DE INMEDIATO SI:   Presenta confusin.  Se produce un cambio en su estado mental.  Es incapaz de tragar.  Se desmaya. Document Released: 05/22/2005 Document Revised: 05/27/2013 [facility] Patient Information 2015 [place]. This information is not intended to replace advice given to you by your health care provider. Make sure you discuss any questions you have with your health care provider.

## 2015-02-15 NOTE — Assessment & Plan Note (Signed)
Type 2 DM, Hgb A1C 6.3 on 8/23. Patient eating carbohydrate with almost every meal.  - Patient to d/c daily Lantus  - Start Glipizide 5 mg twice daily  - PRN Lantus only when blood sugars in the afternoon are over 200, then can take 5 units of lantus at night.  - Will need to see patient in 2 weeks for diabetes check  - Return precautions including low blood sugars, eating meals the administration of sulfonylurea

## 2015-02-22 ENCOUNTER — Ambulatory Visit (INDEPENDENT_AMBULATORY_CARE_PROVIDER_SITE_OTHER): Payer: Self-pay | Admitting: *Deleted

## 2015-02-22 VITALS — BP 176/90 | HR 96

## 2015-02-22 DIAGNOSIS — E1122 Type 2 diabetes mellitus with diabetic chronic kidney disease: Secondary | ICD-10-CM

## 2015-02-22 DIAGNOSIS — E785 Hyperlipidemia, unspecified: Secondary | ICD-10-CM

## 2015-02-22 DIAGNOSIS — I10 Essential (primary) hypertension: Secondary | ICD-10-CM

## 2015-02-22 MED ORDER — AMLODIPINE BESYLATE 5 MG PO TABS: 5.0000 mg | ORAL_TABLET | Freq: Every day | ORAL | Status: DC

## 2015-02-22 MED ORDER — GLIPIZIDE 5 MG PO TABS: 5.0000 mg | ORAL_TABLET | Freq: Two times a day (BID) | ORAL | Status: DC

## 2015-02-23 NOTE — [place] Notes (Signed)
Patient in for blood pressure check.  BP today was 176/90, patient had'nt had any blood pressure mediation. Was unable to pick up medications from pharmacy due to cost.  [place] and they will give a 30-day supply of meds for $4/each this one time, Rx's for amlodipine and glipzide resent to [place] for a 1 month supply and patient given $8 out of our indigent fund to go pick up medications. Patient has BP follow up scheduled with Mikell on 9/26, will forward note to her and PCP.

## 2015-03-01 ENCOUNTER — Ambulatory Visit (INDEPENDENT_AMBULATORY_CARE_PROVIDER_SITE_OTHER): Payer: Self-pay | Admitting: Internal Medicine

## 2015-03-01 ENCOUNTER — Encounter: Payer: Self-pay | Admitting: Internal Medicine

## 2015-03-01 VITALS — BP 171/84 | HR 87 | Temp 98.1°F | Ht 63.0 in | Wt 146.0 lb

## 2015-03-01 DIAGNOSIS — E109 Type 1 diabetes mellitus without complications: Secondary | ICD-10-CM

## 2015-03-01 DIAGNOSIS — E1122 Type 2 diabetes mellitus with diabetic chronic kidney disease: Secondary | ICD-10-CM

## 2015-03-01 DIAGNOSIS — E785 Hyperlipidemia, unspecified: Secondary | ICD-10-CM

## 2015-03-01 DIAGNOSIS — E119 Type 2 diabetes mellitus without complications: Principal | ICD-10-CM

## 2015-03-01 DIAGNOSIS — I10 Essential (primary) hypertension: Secondary | ICD-10-CM

## 2015-03-01 DIAGNOSIS — Z794 Long term (current) use of insulin: Principal | ICD-10-CM

## 2015-03-01 DIAGNOSIS — IMO0001 Reserved for inherently not codable concepts without codable children: Secondary | ICD-10-CM

## 2015-03-01 DIAGNOSIS — N189 Chronic kidney disease, unspecified: Secondary | ICD-10-CM

## 2015-03-01 MED ORDER — AMLODIPINE BESYLATE 5 MG PO TABS: 10.0000 mg | ORAL_TABLET | Freq: Every day | ORAL | Status: DC

## 2015-03-01 NOTE — Patient Instructions (Addendum)
Please return once you have your medicare card as we would like to refer you for opthalmology appointment. Continue to take medications as discussed. Will see you back to readjust blood pressure medications as needed. For now, take 2 pills of Norvasc as discussed .   Diabetes y cuidados del pie (Diabetes and Foot Care) La diabetes puede ser la causa de que el flujo sanguneo (circulacin) en las piernas y los pies sea deficiente. Debido a esto, la piel de los pies se torna ms delgada, se rompe con facilidad y se cura ms lentamente. La piel puede estar seca, despellejarse y Medical illustrator. Tambin pueden estar daados los nervios de las piernas y de los pies lo que provoca una disminucin de la sensibilidad. Es posible que no advierta heridas ms pequeas en los pies, que pueden causar infecciones graves. Cuidar sus pies es una de las cosas ms importantes que puede hacer por usted mismo.  INSTRUCCIONES PARA EL CUIDADO EN EL HOGAR  Use siempre calzado, an dentro de su casa. No camine descalzo. Caminar descalzo facilita que se lastime.  Controle sus pies diariamente para observar ampollas, cortes y enrojecimiento. Si no puede ver la planta del pie, use un espejo o pdale ayuda a Nurse, children's.  Lave sus pies con agua tibia (no use agua caliente) y un [place]. Seque bien sus pies, y la zona [employer] dedos dando Paden, hasta que estn completamente secos. Noremoje los pies, ya que esto puede resecar la piel.  Aplique una locin hidratante o vaselina (que no contenga alcohol ni perfume) en los pies y en las uas secas y [place]. No aplique locin entre los dedos.  Recorte las uas en forma recta. No escarbe debajo de las uas o alrededor [employer]. Lime los bordes de las uas con una lima o esmeril.  No corte las durezas o callosidades, ni trate de quitarlas con medicamentos.  Use calcetines de algodn o medias [employer]. Asegrese de que no le [employer]. Nouse  calcetines que le lleguen a las rodillas, ya que podran disminuir el flujo de sangre a las piernas.  Use zapatos de cuero que le queden bien y que sean acolchados. Para amoldar los zapatos, clcelos slo algunas horas por da. Esto evitar lesiones en los pies. Revise siempre los zapatos antes de ponerlos para asegurarse de que no haya objetos en su interior.  No cruce las piernas. Esto puede disminuir el flujo de sangre a los pies.  Si algo le ha raspado, cortado o lastimado la piel de los pies, mantenga la piel de esa zona limpia y [place]. Debe higienizar estas zonas con agua y un jabn suave. No limpie la zona con agua oxigenada, alcohol ni yodo.  Cuando se quite un vendaje adhesivo, asegrese de no daar la piel.  Si tiene una herida, obsrvela varias veces por da para asegurarse de que se est curando.  No use bolsas de agua caliente ni almohadillas trmicas. Podran causar quemaduras. Si ha perdido la sensibilidad en los pies o las piernas, no sabr lo que le est sucediendo hasta que sea demasiado tarde.  Asegrese de que su mdico le haga un examen completo de los pies por lo menos una vez al ao, o con ms frecuencia si usted tiene [employer]. Informe todos los cortes, llagas o moretones a su mdico inmediatamente. SOLICITE ATENCIN MDICA SI:   Tiene una lesin que no se cura.  Tiene cortes o rajaduras en la piel.  Wilma Flavin  ua encarnada.  Nota una zona irritada en las piernas o los pies.  Siente una sensacin de ardor u hormigueo en las piernas o los pies.  Siente dolor o calambres en las piernas o los pies.  Las piernas o los pies estn adormecidos.  Siente los pies siempre fros. SOLICITE ATENCIN MDICA DE INMEDIATO SI:   Presenta enrojecimiento, hinchazn o aumento del dolor en una herida.  Nota una lnea roja que sube por pierna.  Aparece pus en la herida.  Le sube la fiebre o segn lo que le indique el mdico.  Advierte un olor ftido que  proviene de una lcera o una herida. Document Released: 05/22/2005 Document Revised: 01/22/2013 [facility] Patient Information 2015 [place]. This information is not intended to replace advice given to you by your health care provider. Make sure you discuss any questions you have with your health care provider.

## 2015-03-01 NOTE — Assessment & Plan Note (Signed)
DM Type 2, Controlled   - Continue Glipizide and Lantus (5units), for blood sugars greater than 200  - Discussed Diabetic foot precautions with patient, as patient monofilament test significant for decreased sensation  - Patient will need an opthalmology referral at next visit, patient currently working to get insurance card.

## 2015-03-01 NOTE — Progress Notes (Signed)
Patient ID: Stephen Lane, male   DOB: 1961-04-10, 54 y.o.   MRN: [id number]   [facility] Kerrin Mo, MD Phone: [phone]  Subjective:   # Diabetes: Patient needs referral for opthalmology appointment as has not seen an eye doctor in 5 years and states over the past 3 years has had increased blurry vision. Patient has been compliant with taking glipizide, was seen per patient at family medicine for samples. Fasting blood sugars per patient range from 76- 90. Night time blood sugars in th 120s. Patient denies any hypoglycemia on current regiment. Patient has a max CBG of 240, once after eating a big meal, but has not had any other blood sugars values above 200. He did not take his Lantus at that time as was discussed for his blood sugar above 200, because he stated "he knew it would come down eventually."     # HTN: Blood pressure continues to be elevated. Patient has been on Norvasc for over a week. Patient does not take any home blood pressure measurements. Patient denies headaches or acute blurry vision.   All relevant systems were reviewed and were negative unless otherwise noted in the HPI  Past Medical History Reviewed problem list.  Medications- reviewed and updated Current Outpatient Prescriptions  Medication Sig Dispense Refill  . amLODipine (NORVASC) 5 MG tablet Take 2 tablets (10 mg total) by mouth daily. ATTN: CHRIS 30 tablet 0  . aspirin EC 81 MG EC tablet Take 1 tablet (81 mg total) by mouth daily. (Patient not taking: Reported on 01/25/2015)    . atorvastatin (LIPITOR) 40 MG tablet Take 1 tablet (40 mg total) by mouth daily at 6 PM. 30 tablet 5  . calcitRIOL (ROCALTROL) 0.25 MCG capsule Take 1 capsule (0.25 mcg total) by mouth daily. 30 capsule 3  . calcium acetate (PHOSLO) 667 MG capsule Take 3 capsules (2,001 mg total) by mouth 3 (three) times daily with meals. 30 capsule 3  . glipiZIDE (GLUCOTROL) 5 MG tablet Take 1 tablet (5 mg total) by  mouth 2 (two) times daily before a meal. ATTN: CHRIS 60 tablet 0  . insulin glargine (LANTUS) 100 UNIT/ML injection Inject 0.05 mLs (5 Units total) into the skin at bedtime as needed (Take 5 units is blood sugar level at night is greater than 200.). 10 mL 11  . oxyCODONE-acetaminophen (ROXICET) 5-325 MG per tablet Take 1 tablet by mouth every 6 (six) hours as needed for severe pain. 20 tablet 0   No current facility-administered medications for this visit.   Chief complaint-noted No additions to family history Social history- patient is a non smoker  Objective: BP 171/84 mmHg  Pulse 87  Temp(Src) 98.1 F (36.7 C) (Oral)  Ht $R'5\' 3"'aC$  (1.6 m)  Wt 146 lb (66.225 kg)  BMI 25.87 kg/m2 Gen: NAD, alert, cooperative with exam Neck: FROM, supple CV: RRR, good S1/S2, no murmur, cap refill <3 Resp: CTABL, no wheezes, non-labored Ext: No edema, warm, normal tone, moves UE/LE spontaneously Foot Exam: Patient with no sensation in either foot with monofilament testing. DP/PT pulses  2+ bilaterally. Positive achilles reflex   Assessment/Plan:  Hypertension, essential HTN, Blood pressure remains elevated  - Increase Norvasc to 10 mg from 5 mg  - Will continue monitor, will see patient in  2 weeks for blood pressure follow up   Type 2 diabetes mellitus with diabetic chronic kidney disease DM Type 2, Controlled   - Continue Glipizide and Lantus (5units), for blood sugars  greater than 200  - Discussed Diabetic foot precautions with patient, as patient monofilament test significant for decreased sensation  - Patient will need an opthalmology referral at next visit, patient currently working to get insurance card.

## 2015-03-01 NOTE — Assessment & Plan Note (Signed)
HTN, Blood pressure remains elevated  - Increase Norvasc to 10 mg from 5 mg  - Will continue monitor, will see patient in  2 weeks for blood pressure follow up

## 2015-03-04 DIAGNOSIS — Z0279 Encounter for issue of other medical certificate: Secondary | ICD-10-CM | POA: Diagnosis not present

## 2015-03-05 ENCOUNTER — Encounter: Payer: Self-pay | Admitting: Vascular Surgery

## 2015-03-09 ENCOUNTER — Ambulatory Visit (HOSPITAL_COMMUNITY)
Admission: RE | Admit: 2015-03-09 | Discharge: 2015-03-09 | Disposition: A | Discharge status: Home / Self Care | Payer: Medicaid Other | Source: Ambulatory Visit | Attending: Vascular Surgery | Admitting: Vascular Surgery

## 2015-03-09 ENCOUNTER — Encounter: Payer: Self-pay | Admitting: Vascular Surgery

## 2015-03-09 ENCOUNTER — Other Ambulatory Visit: Payer: Self-pay

## 2015-03-09 ENCOUNTER — Encounter (HOSPITAL_COMMUNITY): Payer: Self-pay

## 2015-03-09 ENCOUNTER — Ambulatory Visit (INDEPENDENT_AMBULATORY_CARE_PROVIDER_SITE_OTHER): Payer: Self-pay | Admitting: Vascular Surgery

## 2015-03-09 VITALS — BP 159/85 | HR 84 | Temp 96.9°F | Resp 16 | Ht 63.0 in | Wt 152.0 lb

## 2015-03-09 DIAGNOSIS — Z48812 Encounter for surgical aftercare following surgery on the circulatory system: Secondary | ICD-10-CM | POA: Insufficient documentation

## 2015-03-09 DIAGNOSIS — N186 End stage renal disease: Secondary | ICD-10-CM | POA: Diagnosis not present

## 2015-03-09 MED ORDER — HEPARIN SODIUM (PORCINE) 1000 UNIT/ML DIALYSIS: 20.0000 [IU]/kg | INTRAMUSCULAR | Status: DC | PRN

## 2015-03-09 NOTE — Progress Notes (Signed)
Filed Vitals:   03/09/15 0932 03/09/15 0937  BP: 164/83 159/85  Pulse: 84 84  Temp: 96.9 F (36.1 C)   TempSrc: Oral   Resp: 16   Height: $Remove'5\' 3"'LGvxOuh$  (1.6 m)   Weight: 152 lb (68.947 kg)   SpO2: 100%

## 2015-03-09 NOTE — Progress Notes (Signed)
Subjective:     Patient ID: Stephen Lane, male   DOB: 1960/10/10, 54 y.o.   MRN: [id number]  HPI this 54 year old male with end-stage renal disease has dialysis on Tuesday Thursday Saturday. He had creation of left brachial-cephalic AV fistula by me on 01/28/2015. He has occasionally had some tingling of the left fourth and fifth fingers while on dialysis but that has not been a significant problem. He denies any pain in the left hand. Catheter in the right IJ has been used without difficulty.  Past Medical History  Diagnosis Date  . Coronary atherosclerosis     a. admx with [place] 11/13 => LHC 04/15/12: pLAD 50%, oD1 75% (small), pCFX 40% followed by mCFX 95%, pRCA 60%, mRCA 90%, EF 55-65%. PCI 04/15/12: DES to the [facility] and DES to the [facility].  . Mixed hyperlipidemia   . Essential hypertension   . CKD (chronic kidney disease) stage 3, GFR 30-59 ml/min   . Carotid stenosis     a. Carotid US (05/2013):  1-39% bilateral ICA; brachial waveforms triphasic bilat; vertebrals with antegrade flow bilat; repeat 1 year  . Type II diabetes mellitus ([place])   . Daily headache   . Chronic high back pain     "where the lungs are located" (01/26/2015)    Social History  Substance Use Topics  . Smoking status: Former Smoker -- 0.25 packs/day for 10 years    Types: Cigarettes  . Smokeless tobacco: Never Used     Comment: "quit smoking cigarettes in the 1980's"  . Alcohol Use: Yes     Comment: "quit drinking in ~ 2010"    Family History  Problem Relation Age of Onset  . Hypertension Mother     died 52 years old unknown reasons  . Other Father     died when Griselda was 80 years old from "infection"  . Diabetes Brother   . Hypertension Sister     No Known Allergies   Current outpatient prescriptions:  .  amLODipine (NORVASC) 5 MG tablet, Take 2 tablets (10 mg total) by mouth daily. ATTN: CHRIS, Disp: 30 tablet, Rfl: 0 .  aspirin EC 81 MG EC tablet, Take 1 tablet (81 mg total) by mouth  daily., Disp: , Rfl:  .  atorvastatin (LIPITOR) 40 MG tablet, Take 1 tablet (40 mg total) by mouth daily at 6 PM., Disp: 30 tablet, Rfl: 5 .  calcitRIOL (ROCALTROL) 0.25 MCG capsule, Take 1 capsule (0.25 mcg total) by mouth daily., Disp: 30 capsule, Rfl: 3 .  calcium acetate (PHOSLO) 667 MG capsule, Take 3 capsules (2,001 mg total) by mouth 3 (three) times daily with meals., Disp: 30 capsule, Rfl: 3 .  glipiZIDE (GLUCOTROL) 5 MG tablet, Take 1 tablet (5 mg total) by mouth 2 (two) times daily before a meal. ATTN: CHRIS, Disp: 60 tablet, Rfl: 0 .  insulin glargine (LANTUS) 100 UNIT/ML injection, Inject 0.05 mLs (5 Units total) into the skin at bedtime as needed (Take 5 units is blood sugar level at night is greater than 200.). (Patient not taking: Reported on 03/09/2015), Disp: 10 mL, Rfl: 11 .  oxyCODONE-acetaminophen (ROXICET) 5-325 MG per tablet, Take 1 tablet by mouth every 6 (six) hours as needed for severe pain. (Patient not taking: Reported on 03/09/2015), Disp: 20 tablet, Rfl: 0  Filed Vitals:   03/09/15 0932 03/09/15 0937  BP: 164/83 159/85  Pulse: 84 84  Temp: 96.9 F (36.1 C)   TempSrc: Oral   Resp: 16   Height:  $'5\' 3"'v$  (1.6 m)   Weight: 152 lb (68.947 kg)   SpO2: 100%     Body mass index is 26.93 kg/(m^2).         Review of Systems denies chest pain, dyspnea on exertion, PND, orthopnea, hemoptysis, claudication.    Objective:   Physical Exam BP 159/85 mmHg  Pulse 84  Temp(Src) 96.9 F (36.1 C) (Oral)  Resp 16  Ht $R'5\' 3"'Fp$  (1.6 m)  Wt 152 lb (68.947 kg)  BMI 26.93 kg/m2  SpO2 100%  Gen.-alert and oriented x3 in no apparent distress HEENT normal for age Lungs no rhonchi or wheezing Cardiovascular regular rhythm no murmurs carotid pulses 3+ palpable no bruits audible Abdomen soft nontender no palpable masses Musculoskeletal free of  major deformities Skin clear -no rashes Neurologic normal Lower extremities 3+ femoral and dorsalis pedis pulses palpable bilaterally  with no edema Left upper extremity with brachial-cephalic AV fistula with excellent pulse and palpable thrill. Arch branch in the distal third of the upper arm is easily visible which communicates with the fistula and then extends down into the forearm where he is also readily visible and pulsatile. 2+ radial pulse palpable with well-perfused left hand.  Patient had duplex scan of the fistula today which revealed excellent flow throughout the fistula but also did reveal this branch in the distal upper arm       Assessment:     Nicely functioning left brachial-cephalic AV fistula performed on 01/28/2015 with 1 significant competing branch and distal upper arm    Plan:     Plan ligation of single competing branch left upper arm brachial-cephalic AV fistula. Discussed with patient via interpreter and he understands and would like to proceed tomorrow under local anesthesia with sedation

## 2015-03-10 ENCOUNTER — Ambulatory Visit (HOSPITAL_COMMUNITY): Payer: Medicaid Other | Admitting: Certified Registered"

## 2015-03-10 ENCOUNTER — Ambulatory Visit (HOSPITAL_COMMUNITY)
Admission: RE | Admit: 2015-03-10 | Discharge: 2015-03-10 | Disposition: A | Discharge status: Home / Self Care | Payer: Medicaid Other | Source: Ambulatory Visit | Attending: Vascular Surgery | Admitting: Vascular Surgery

## 2015-03-10 ENCOUNTER — Encounter (HOSPITAL_COMMUNITY)
Admission: RE | Disposition: A | Discharge status: Home / Self Care | Payer: Self-pay | Source: Ambulatory Visit | Attending: Vascular Surgery

## 2015-03-10 DIAGNOSIS — Z992 Dependence on renal dialysis: Secondary | ICD-10-CM | POA: Diagnosis not present

## 2015-03-10 DIAGNOSIS — I120 Hypertensive chronic kidney disease with stage 5 chronic kidney disease or end stage renal disease: Secondary | ICD-10-CM | POA: Insufficient documentation

## 2015-03-10 DIAGNOSIS — I2510 Atherosclerotic heart disease of native coronary artery without angina pectoris: Secondary | ICD-10-CM | POA: Diagnosis not present

## 2015-03-10 DIAGNOSIS — E782 Mixed hyperlipidemia: Secondary | ICD-10-CM | POA: Diagnosis not present

## 2015-03-10 DIAGNOSIS — I493 Ventricular premature depolarization: Secondary | ICD-10-CM | POA: Diagnosis not present

## 2015-03-10 DIAGNOSIS — G894 Chronic pain syndrome: Secondary | ICD-10-CM | POA: Insufficient documentation

## 2015-03-10 DIAGNOSIS — E1122 Type 2 diabetes mellitus with diabetic chronic kidney disease: Secondary | ICD-10-CM | POA: Insufficient documentation

## 2015-03-10 DIAGNOSIS — N186 End stage renal disease: Secondary | ICD-10-CM | POA: Insufficient documentation

## 2015-03-10 DIAGNOSIS — Y9289 Other specified places as the place of occurrence of the external cause: Secondary | ICD-10-CM | POA: Diagnosis not present

## 2015-03-10 DIAGNOSIS — I6523 Occlusion and stenosis of bilateral carotid arteries: Secondary | ICD-10-CM | POA: Diagnosis not present

## 2015-03-10 DIAGNOSIS — T82898A Other specified complication of vascular prosthetic devices, implants and grafts, initial encounter: Secondary | ICD-10-CM | POA: Insufficient documentation

## 2015-03-10 DIAGNOSIS — Y813 Surgical instruments, materials and general- and plastic-surgery devices (including sutures) associated with adverse incidents: Secondary | ICD-10-CM | POA: Diagnosis not present

## 2015-03-10 DIAGNOSIS — M5489 Other dorsalgia: Secondary | ICD-10-CM | POA: Diagnosis not present

## 2015-03-10 DIAGNOSIS — Z7982 Long term (current) use of aspirin: Secondary | ICD-10-CM | POA: Diagnosis not present

## 2015-03-10 DIAGNOSIS — Z87891 Personal history of nicotine dependence: Secondary | ICD-10-CM | POA: Insufficient documentation

## 2015-03-10 HISTORY — PX: LIGATION OF COMPETING BRANCHES OF ARTERIOVENOUS FISTULA: SHX5949

## 2015-03-10 LAB — GLUCOSE, CAPILLARY
GLUCOSE-CAPILLARY: 116 mg/dL — AB (ref 65–99)
Glucose-Capillary: 124 mg/dL — ABNORMAL HIGH (ref 65–99)
Glucose-Capillary: 105 mg/dL — ABNORMAL HIGH (ref 65–99)

## 2015-03-10 LAB — POCT I-STAT 4, (NA,K, GLUC, HGB,HCT)
GLUCOSE: 135 mg/dL — AB (ref 65–99)
HEMATOCRIT: 43 % (ref 39.0–52.0)
HEMOGLOBIN: 14.6 g/dL (ref 13.0–17.0)
Potassium: 5 mmol/L (ref 3.5–5.1)
SODIUM: 135 mmol/L (ref 135–145)

## 2015-03-10 SURGERY — LIGATION OF COMPETING BRANCHES OF ARTERIOVENOUS FISTULA
Anesthesia: Monitor Anesthesia Care | Site: Arm Lower | Laterality: Left

## 2015-03-10 MED ORDER — DEXTROSE 5 % IV SOLN
1.5000 g | INTRAVENOUS | Status: AC
  Administered 2015-03-10: 1.5 g via INTRAVENOUS
  Filled 2015-03-10: qty 1.5

## 2015-03-10 MED ORDER — PHENYLEPHRINE HCL 10 MG/ML IJ SOLN
INTRAMUSCULAR | Status: DC | PRN
  Administered 2015-03-10: 80 ug via INTRAVENOUS
  Administered 2015-03-10: 40 ug via INTRAVENOUS

## 2015-03-10 MED ORDER — MIDAZOLAM HCL 2 MG/2ML IJ SOLN
INTRAMUSCULAR | Status: AC
  Filled 2015-03-10: qty 4

## 2015-03-10 MED ORDER — LIDOCAINE HCL (CARDIAC) 20 MG/ML IV SOLN
INTRAVENOUS | Status: AC
  Filled 2015-03-10: qty 5

## 2015-03-10 MED ORDER — FENTANYL CITRATE (PF) 250 MCG/5ML IJ SOLN
INTRAMUSCULAR | Status: AC
  Filled 2015-03-10: qty 5

## 2015-03-10 MED ORDER — CHLORHEXIDINE GLUCONATE CLOTH 2 % EX PADS: 6.0000 | MEDICATED_PAD | Freq: Once | CUTANEOUS | Status: DC

## 2015-03-10 MED ORDER — PROPOFOL 10 MG/ML IV BOLUS
INTRAVENOUS | Status: AC
  Filled 2015-03-10: qty 20

## 2015-03-10 MED ORDER — OXYCODONE-ACETAMINOPHEN 5-325 MG PO TABS: 1.0000 | ORAL_TABLET | Freq: Four times a day (QID) | ORAL | Status: DC | PRN

## 2015-03-10 MED ORDER — LIDOCAINE HCL (CARDIAC) 20 MG/ML IV SOLN
INTRAVENOUS | Status: DC | PRN
Start: 2015-03-10 — End: 2015-03-10
  Administered 2015-03-10: 80 mg via INTRAVENOUS

## 2015-03-10 MED ORDER — PROPOFOL 10 MG/ML IV BOLUS
INTRAVENOUS | Status: DC | PRN
  Administered 2015-03-10: 20 mg via INTRAVENOUS
  Administered 2015-03-10 (×2): 50 mg via INTRAVENOUS

## 2015-03-10 MED ORDER — SODIUM CHLORIDE 0.9 % IV SOLN
INTRAVENOUS | Status: DC
  Administered 2015-03-10 (×2): via INTRAVENOUS

## 2015-03-10 MED ORDER — ONDANSETRON HCL 4 MG/2ML IJ SOLN
INTRAMUSCULAR | Status: AC
  Filled 2015-03-10: qty 2

## 2015-03-10 MED ORDER — LIDOCAINE-EPINEPHRINE (PF) 1 %-1:200000 IJ SOLN
INTRAMUSCULAR | Status: AC
  Filled 2015-03-10: qty 30

## 2015-03-10 MED ORDER — LIDOCAINE-EPINEPHRINE (PF) 1 %-1:200000 IJ SOLN
INTRAMUSCULAR | Status: DC | PRN
  Administered 2015-03-10: 4 mL

## 2015-03-10 MED ORDER — MIDAZOLAM HCL 2 MG/2ML IJ SOLN
INTRAMUSCULAR | Status: DC | PRN
  Administered 2015-03-10: 2 mg via INTRAVENOUS

## 2015-03-10 MED ORDER — LIDOCAINE HCL (PF) 1 % IJ SOLN
INTRAMUSCULAR | Status: AC
  Filled 2015-03-10: qty 30

## 2015-03-10 MED ORDER — FENTANYL CITRATE (PF) 250 MCG/5ML IJ SOLN
INTRAMUSCULAR | Status: DC | PRN
  Administered 2015-03-10: 50 ug via INTRAVENOUS

## 2015-03-10 MED ORDER — 0.9 % SODIUM CHLORIDE (POUR BTL) OPTIME
TOPICAL | Status: DC | PRN
  Administered 2015-03-10: 1000 mL

## 2015-03-10 SURGICAL SUPPLY — 31 items
CANISTER SUCTION 2500CC (MISCELLANEOUS) ×3 IMPLANT
CLIP TI MEDIUM 6 (CLIP) ×2 IMPLANT
CLIP TI WIDE RED SMALL 6 (CLIP) ×2 IMPLANT
DECANTER SPIKE VIAL GLASS SM (MISCELLANEOUS) ×2 IMPLANT
ELECT REM PT RETURN 9FT ADLT (ELECTROSURGICAL) ×3
ELECTRODE REM PT RTRN 9FT ADLT (ELECTROSURGICAL) ×1 IMPLANT
GAUZE SPONGE 4X4 12PLY STRL (GAUZE/BANDAGES/DRESSINGS) ×3 IMPLANT
GEL ULTRASOUND 20GR AQUASONIC (MISCELLANEOUS) IMPLANT
GLOVE BIOGEL PI IND STRL 6.5 (GLOVE) IMPLANT
GLOVE BIOGEL PI INDICATOR 6.5 (GLOVE) ×4
GLOVE ECLIPSE 7.5 STRL STRAW (GLOVE) ×2 IMPLANT
GLOVE SS BIOGEL STRL SZ 7 (GLOVE) ×1 IMPLANT
GLOVE SUPERSENSE BIOGEL SZ 7 (GLOVE) ×2
GOWN STRL REUS W/ TWL LRG LVL3 (GOWN DISPOSABLE) ×3 IMPLANT
GOWN STRL REUS W/ TWL XL LVL3 (GOWN DISPOSABLE) IMPLANT
GOWN STRL REUS W/TWL LRG LVL3 (GOWN DISPOSABLE) ×6
GOWN STRL REUS W/TWL XL LVL3 (GOWN DISPOSABLE) ×3
KIT BASIN OR (CUSTOM PROCEDURE TRAY) ×3 IMPLANT
KIT ROOM TURNOVER OR (KITS) ×3 IMPLANT
LIQUID BAND (GAUZE/BANDAGES/DRESSINGS) ×3 IMPLANT
NS IRRIG 1000ML POUR BTL (IV SOLUTION) ×3 IMPLANT
PACK CV ACCESS (CUSTOM PROCEDURE TRAY) ×3 IMPLANT
PAD ARMBOARD 7.5X6 YLW CONV (MISCELLANEOUS) ×6 IMPLANT
SUT PROLENE 6 0 BV (SUTURE) IMPLANT
SUT SILK 0 TIES 10X30 (SUTURE) ×3 IMPLANT
SUT VIC AB 3-0 SH 27 (SUTURE) ×3
SUT VIC AB 3-0 SH 27X BRD (SUTURE) ×1 IMPLANT
SWAB COLLECTION DEVICE MRSA (MISCELLANEOUS) IMPLANT
TUBE ANAEROBIC SPECIMEN COL (MISCELLANEOUS) IMPLANT
UNDERPAD 30X30 INCONTINENT (UNDERPADS AND DIAPERS) ×3 IMPLANT
WATER STERILE IRR 1000ML POUR (IV SOLUTION) ×1 IMPLANT

## 2015-03-10 NOTE — H&P (View-Only) (Signed)
Subjective:     Patient ID: Stephen Lane, male   DOB: Jan 25, 1961, 54 y.o.   MRN: [id number]  HPI this 54 year old male with end-stage renal disease has dialysis on Tuesday Thursday Saturday. He had creation of left brachial-cephalic AV fistula by me on 01/28/2015. He has occasionally had some tingling of the left fourth and fifth fingers while on dialysis but that has not been a significant problem. He denies any pain in the left hand. Catheter in the right IJ has been used without difficulty.  Past Medical History  Diagnosis Date  . Coronary atherosclerosis     a. admx with [place] 11/13 => LHC 04/15/12: pLAD 50%, oD1 75% (small), pCFX 40% followed by mCFX 95%, pRCA 60%, mRCA 90%, EF 55-65%. PCI 04/15/12: [facility] to the [facility] and DES to the [facility].  . Mixed hyperlipidemia   . Essential hypertension   . CKD (chronic kidney disease) stage 3, GFR 30-59 ml/min   . Carotid stenosis     a. Carotid US (05/2013):  1-39% bilateral ICA; brachial waveforms triphasic bilat; vertebrals with antegrade flow bilat; repeat 1 year  . Type II diabetes mellitus ([place])   . Daily headache   . Chronic high back pain     "where the lungs are located" (01/26/2015)    Social History  Substance Use Topics  . Smoking status: Former Smoker -- 0.25 packs/day for 10 years    Types: Cigarettes  . Smokeless tobacco: Never Used     Comment: "quit smoking cigarettes in the 1980's"  . Alcohol Use: Yes     Comment: "quit drinking in ~ 2010"    Family History  Problem Relation Age of Onset  . Hypertension Mother     died 60 years old unknown reasons  . Other Father     died when [name] was 33 years old from "infection"  . Diabetes Brother   . Hypertension Sister     No Known Allergies   Current outpatient prescriptions:  .  amLODipine (NORVASC) 5 MG tablet, Take 2 tablets (10 mg total) by mouth daily. ATTN: CHRIS, Disp: 30 tablet, Rfl: 0 .  aspirin EC 81 MG EC tablet, Take 1 tablet (81 mg total) by mouth  daily., Disp: , Rfl:  .  atorvastatin (LIPITOR) 40 MG tablet, Take 1 tablet (40 mg total) by mouth daily at 6 PM., Disp: 30 tablet, Rfl: 5 .  calcitRIOL (ROCALTROL) 0.25 MCG capsule, Take 1 capsule (0.25 mcg total) by mouth daily., Disp: 30 capsule, Rfl: 3 .  calcium acetate (PHOSLO) 667 MG capsule, Take 3 capsules (2,001 mg total) by mouth 3 (three) times daily with meals., Disp: 30 capsule, Rfl: 3 .  glipiZIDE (GLUCOTROL) 5 MG tablet, Take 1 tablet (5 mg total) by mouth 2 (two) times daily before a meal. ATTN: CHRIS, Disp: 60 tablet, Rfl: 0 .  insulin glargine (LANTUS) 100 UNIT/ML injection, Inject 0.05 mLs (5 Units total) into the skin at bedtime as needed (Take 5 units is blood sugar level at night is greater than 200.). (Patient not taking: Reported on 03/09/2015), Disp: 10 mL, Rfl: 11 .  oxyCODONE-acetaminophen (ROXICET) 5-325 MG per tablet, Take 1 tablet by mouth every 6 (six) hours as needed for severe pain. (Patient not taking: Reported on 03/09/2015), Disp: 20 tablet, Rfl: 0  Filed Vitals:   03/09/15 0932 03/09/15 0937  BP: 164/83 159/85  Pulse: 84 84  Temp: 96.9 F (36.1 C)   TempSrc: Oral   Resp: 16   Height:  $'5\' 3"'c$  (1.6 m)   Weight: 152 lb (68.947 kg)   SpO2: 100%     Body mass index is 26.93 kg/(m^2).         Review of Systems denies chest pain, dyspnea on exertion, PND, orthopnea, hemoptysis, claudication.    Objective:   Physical Exam BP 159/85 mmHg  Pulse 84  Temp(Src) 96.9 F (36.1 C) (Oral)  Resp 16  Ht $R'5\' 3"'JR$  (1.6 m)  Wt 152 lb (68.947 kg)  BMI 26.93 kg/m2  SpO2 100%  Gen.-alert and oriented x3 in no apparent distress HEENT normal for age Lungs no rhonchi or wheezing Cardiovascular regular rhythm no murmurs carotid pulses 3+ palpable no bruits audible Abdomen soft nontender no palpable masses Musculoskeletal free of  major deformities Skin clear -no rashes Neurologic normal Lower extremities 3+ femoral and dorsalis pedis pulses palpable bilaterally  with no edema Left upper extremity with brachial-cephalic AV fistula with excellent pulse and palpable thrill. Arch branch in the distal third of the upper arm is easily visible which communicates with the fistula and then extends down into the forearm where he is also readily visible and pulsatile. 2+ radial pulse palpable with well-perfused left hand.  Patient had duplex scan of the fistula today which revealed excellent flow throughout the fistula but also did reveal this branch in the distal upper arm       Assessment:     Nicely functioning left brachial-cephalic AV fistula performed on 01/28/2015 with 1 significant competing branch and distal upper arm    Plan:     Plan ligation of single competing branch left upper arm brachial-cephalic AV fistula. Discussed with patient via interpreter and he understands and would like to proceed tomorrow under local anesthesia with sedation

## 2015-03-10 NOTE — Anesthesia Preprocedure Evaluation (Addendum)
Anesthesia Evaluation  Patient identified by MRN, date of birth, ID band Patient awake    Reviewed: Allergy & Precautions, NPO status , Patient's Chart, lab work & pertinent test results  History of Anesthesia Complications Negative for: history of anesthetic complications  Airway Mallampati: II  TM Distance: >3 FB Neck ROM: Full    Dental  (+) Teeth Intact, Dental Advisory Given, Partial Upper, Partial Lower   Pulmonary former smoker,    Pulmonary exam normal        Cardiovascular hypertension, + CAD and + Peripheral Vascular Disease  Normal cardiovascular exam  Study Conclusions  - Left ventricle: The cavity size was normal. Wall thickness was increased in a pattern of mild LVH. Systolic function was normal. The estimated ejection fraction was in the range of 50% to 55%. There is hypokinesis of the mid-apicallateral myocardium.    Neuro/Psych negative neurological ROS  negative psych ROS   GI/Hepatic Neg liver ROS, GERD  ,  Endo/Other  diabetes  Renal/GU ESRF and DialysisRenal disease     Musculoskeletal   Abdominal   Peds  Hematology   Anesthesia Other Findings   Reproductive/Obstetrics                            Anesthesia Physical Anesthesia Plan  ASA: III  Anesthesia Plan: MAC   Post-op Pain Management:    Induction: Intravenous  Airway Management Planned: Simple Face Mask  Additional Equipment:   Intra-op Plan:   Post-operative Plan:   Informed Consent: I have reviewed the patients History and Physical, chart, labs and discussed the procedure including the risks, benefits and alternatives for the proposed anesthesia with the patient or authorized representative who has indicated his/her understanding and acceptance.   Dental advisory given  Plan Discussed with: CRNA, Anesthesiologist and Surgeon  Anesthesia Plan Comments: (Interpreter was used for the  interview)       Anesthesia Quick Evaluation

## 2015-03-10 NOTE — Interval H&P Note (Signed)
History and Physical Interval Note:  03/10/2015 10:01 AM  Stephen Lane  has presented today for surgery, with the diagnosis of End Stage Renal Disease N18.6  The various methods of treatment have been discussed with the patient and family. After consideration of risks, benefits and other options for treatment, the patient has consented to  Procedure(s): LIGATION OF COMPETING BRANCHES OF BRACHIOCEPHALIC ARTERIOVENOUS FISTULA (Left) as a surgical intervention .  The patient's history has been reviewed, patient examined, no change in status, stable for surgery.  I have reviewed the patient's chart and labs.  Questions were answered to the patient's satisfaction.     Tinnie Gens

## 2015-03-10 NOTE — Op Note (Signed)
OPERATIVE REPORT  Date of Surgery: 03/10/2015  Surgeon: Tinnie Gens, MD  Assistant: Nurse  Pre-op Diagnosis: End Stage Renal Disease N18.6  Post-op Diagnosis: End Stage Renal Disease N18.6  Procedure: Procedure(s): LIGATION OF COMPETING BRANCHES OF LEFT ARM BRACHIOCEPHALIC ARTERIOVENOUS FISTULA  Anesthesia: Mac  EBL: Minimal  Complications: None  Procedure Details: The patient was taken the operating room placed in supine position. The left upper extremity was then imaged with B-mode ultrasound. There was a large competing branch from the left brachial-cephalic AV fistula which arose in the distal third of the upper arm and provided pulsatile flow into the forearm from this large branch. This was marked the patient was prepped and draped in routine sterile manner. After infiltration of 1 Xylocaine with epinephrine a short longitudinal incision was made over the origin of the branch. The branch was easily identified and traced up to the origin where it converged with the main cephalic vein. It was ligated with 3-0 silk tie. This immediately decompressed the pulsatile branch and slightly improved the flow proximally into the cephalic vein. Adequate hemostasis was achieved and wound closed in layers with Vicryls subcutaneous tic or fashion with Dermabond patient taken to recovery in satisfactory condition   Tinnie Gens, MD 03/10/2015 10:55 AM

## 2015-03-10 NOTE — Transfer of Care (Signed)
Immediate Anesthesia Transfer of Care Note  Patient: Stephen Lane  Procedure(s) Performed: Procedure(s): LIGATION OF COMPETING BRANCHES OF LEFT ARM BRACHIOCEPHALIC ARTERIOVENOUS FISTULA (Left)  Patient Location: PACU  Anesthesia Type:MAC  Level of Consciousness: awake and alert   Airway & Oxygen Therapy: Patient Spontanous Breathing and Patient connected to face mask oxygen  Post-op Assessment: Report given to RN and Post -op Vital signs reviewed and stable  Post vital signs: Reviewed and stable  Last Vitals:  Filed Vitals:   03/10/15 0753  BP: 141/80  Pulse: 76  Temp: 36.4 C  Resp: 16    Complications: No apparent anesthesia complications

## 2015-03-10 NOTE — Anesthesia Postprocedure Evaluation (Signed)
Anesthesia Post Note  Patient: Stephen Lane  Procedure(s) Performed: Procedure(s) (LRB): LIGATION OF COMPETING BRANCHES OF LEFT ARM BRACHIOCEPHALIC ARTERIOVENOUS FISTULA (Left)  Anesthesia type: MAC  Patient location: PACU  Post pain: Pain level controlled  Post assessment: Patient's Cardiovascular Status Stable  Last Vitals:  Filed Vitals:   03/10/15 1118  BP: 105/63  Pulse: 71  Temp:   Resp: 14    Post vital signs: Reviewed and stable  Level of consciousness: sedated  Complications: No apparent anesthesia complications

## 2015-03-11 ENCOUNTER — Encounter (HOSPITAL_COMMUNITY): Payer: Self-pay | Admitting: Vascular Surgery

## 2015-03-17 ENCOUNTER — Ambulatory Visit: Payer: Self-pay | Admitting: Internal Medicine

## 2015-09-27 ENCOUNTER — Encounter: Payer: Self-pay | Admitting: Family Medicine

## 2015-09-27 ENCOUNTER — Ambulatory Visit (INDEPENDENT_AMBULATORY_CARE_PROVIDER_SITE_OTHER): Payer: Medicaid Other | Admitting: Family Medicine

## 2015-09-27 VITALS — BP 126/60 | HR 86 | Temp 97.7°F | Ht 63.0 in | Wt 154.3 lb

## 2015-09-27 DIAGNOSIS — N186 End stage renal disease: Secondary | ICD-10-CM

## 2015-09-27 DIAGNOSIS — N185 Chronic kidney disease, stage 5: Secondary | ICD-10-CM | POA: Diagnosis not present

## 2015-09-27 DIAGNOSIS — E1122 Type 2 diabetes mellitus with diabetic chronic kidney disease: Secondary | ICD-10-CM

## 2015-09-27 DIAGNOSIS — I10 Essential (primary) hypertension: Secondary | ICD-10-CM

## 2015-09-27 DIAGNOSIS — E785 Hyperlipidemia, unspecified: Secondary | ICD-10-CM | POA: Diagnosis not present

## 2015-09-27 DIAGNOSIS — I2510 Atherosclerotic heart disease of native coronary artery without angina pectoris: Secondary | ICD-10-CM

## 2015-09-27 DIAGNOSIS — E1142 Type 2 diabetes mellitus with diabetic polyneuropathy: Secondary | ICD-10-CM

## 2015-09-27 LAB — CBC
HEMATOCRIT: 33.8 % — AB (ref 38.5–50.0)
Hemoglobin: 11.4 g/dL — ABNORMAL LOW (ref 13.2–17.1)
MCH: 31.7 pg (ref 27.0–33.0)
MCHC: 33.7 g/dL (ref 32.0–36.0)
MCV: 93.9 fL (ref 80.0–100.0)
MPV: 9.9 fL (ref 7.5–12.5)
PLATELETS: 276 10*3/uL (ref 140–400)
RBC: 3.6 MIL/uL — ABNORMAL LOW (ref 4.20–5.80)
RDW: 14.8 % (ref 11.0–15.0)
WBC: 6.8 10*3/uL (ref 3.8–10.8)

## 2015-09-27 LAB — POCT GLYCOSYLATED HEMOGLOBIN (HGB A1C): Hemoglobin A1C: 7.8

## 2015-09-27 MED ORDER — ASPIRIN EC 81 MG PO TBEC: 81.0000 mg | DELAYED_RELEASE_TABLET | Freq: Every day | ORAL | Status: DC

## 2015-09-27 MED ORDER — GLIPIZIDE ER 2.5 MG PO TB24: 2.5000 mg | ORAL_TABLET | Freq: Every day | ORAL | Status: DC

## 2015-09-27 MED ORDER — AMLODIPINE BESYLATE 5 MG PO TABS: 10.0000 mg | ORAL_TABLET | Freq: Every day | ORAL | Status: DC

## 2015-09-27 MED ORDER — ATORVASTATIN CALCIUM 40 MG PO TABS: 40.0000 mg | ORAL_TABLET | Freq: Every day | ORAL | Status: DC

## 2015-09-27 NOTE — Patient Instructions (Signed)
Su nivel de Designer, television/film set sangre es alto hoy. - Reinicie glipizide ER 2.5 mg al da - Revise su nivel de azcar cada maana; Antes de tomar medicamentos o desayunar - Vuelta en 3-4 semanas; Lleve su medidor o registro de azcar en la sangre a esta cita - Le remiti a la oftalmologa para el examen de la vista de la diabetes; Por favor hgamelo saber si usted no ha sido programado con ellos en las prximas 2 semanas  Tu presin arterial es buena hoy - Contine tomando Norvasc 10 mg (dos [employer]) al da  Llame y programe su colonoscopia  Llamar y programar el seguimiento con su cardilogo

## 2015-09-27 NOTE — Progress Notes (Signed)
  Patient name: Stephen Lane MRN [id number]  Date of birth: [birth year]  CC & HPI:  Stephen Lane is a 55 y.o. male presenting today for DM, HTN, HLD.   DIABETES  Lab Results  Component Value Date   HGBA1C 7.8 09/27/2015    Blood Sugar Ranges: Average: reports 120-130; Max 170-190  Symptoms of Hypoglycemia? No  Comorbid Symptoms: Neuropathy: Yes; numbness;  Vision problems: yes; blurry  Medication Side Effects: Not taking medications  CHRONIC HYPERTENSION BP Readings from Last 3 Encounters:  09/27/15 126/60  03/10/15 104/67  03/09/15 159/85    Disease Monitoring  Blood pressure range outside clinc: No  Chest pain: no   Dyspnea: no   Claudication: no  Medication Side Effects: Not taking   HLD Lab Results  Component Value Date   LDLCALC 124* 01/26/2015    Medication Compliance: Not taking  Medication compliance: no, not taking Lantus, glipizide, asa, lipitor   Preventitive Healthcare:  Smoking: Previous   Smoking History Noted  Objective Findings:  Vitals: BP 126/60 mmHg  Pulse 86  Temp(Src) 97.7 F (36.5 C) (Oral)  Ht $R'5\' 3"'GD$  (1.6 m)  Wt 154 lb 4.8 oz (69.99 kg)  BMI 27.34 kg/m2  Gen: NAD CV: RRR 4/6 systolic murmur, pulses +2 b/l Resp: CTAB w/ normal respiratory effort Foot Exam: Pulses 2+; No Ulcers, bruises or cuts; Monofilament testing: decrease sensation  Assessment & Plan:   Coronary Artery Disease Stressed importance of being compliant with aspirin and Lipitor given CAD stents - Recommended following up with cardiologist yearly - Refilled ASA 81 mg, Lipitor 40 mg daily at bedtime  Hypertension, essential BP at goal Today - Continue Norvasc 10 mg daily  ESRD (end stage renal disease) Has duplicate bottles for calcium.  Recommended bringing all medications to his next appointment with his nephrologist - check CMP & CBC  Type 2 diabetes mellitus with diabetic chronic kidney disease A1c greater than 7.  Has been noncompliant  with glipizide and Lantus.  Reports hyperglycemia previously with Lantus.  - Restart glipizide 2.5 ER  - Recommended checking blood sugars daily and Follow-up in 3-4 weeks - Has blurry vision; refer to ophthalmology  Hyperlipidemia Has been noncompliant with Lipitor - Restart Lipitor 40 mg daily at bedtime - Check LDL

## 2015-09-27 NOTE — Assessment & Plan Note (Signed)
A1c greater than 7.  Has been noncompliant with glipizide and Lantus.  Reports hyperglycemia previously with Lantus.  - Restart glipizide 2.5 ER  - Recommended checking blood sugars daily and Follow-up in 3-4 weeks - Has blurry vision; refer to ophthalmology

## 2015-09-27 NOTE — Assessment & Plan Note (Signed)
Has been noncompliant with Lipitor - Restart Lipitor 40 mg daily at bedtime - Check LDL

## 2015-09-27 NOTE — Assessment & Plan Note (Signed)
Stressed importance of being compliant with aspirin and Lipitor given CAD stents - Recommended following up with cardiologist yearly - Refilled ASA 81 mg, Lipitor 40 mg daily at bedtime

## 2015-09-27 NOTE — Assessment & Plan Note (Signed)
BP at goal Today - Continue Norvasc 10 mg daily

## 2015-09-27 NOTE — Assessment & Plan Note (Signed)
Has duplicate bottles for calcium.  Recommended bringing all medications to his next appointment with his nephrologist - check CMP & CBC

## 2015-09-28 LAB — COMPLETE METABOLIC PANEL WITH GFR
ALT: 13 U/L (ref 9–46)
AST: 8 U/L — AB (ref 10–35)
Albumin: 4.4 g/dL (ref 3.6–5.1)
Alkaline Phosphatase: 83 U/L (ref 40–115)
BUN: 58 mg/dL — AB (ref 7–25)
CALCIUM: 8.7 mg/dL (ref 8.6–10.3)
CHLORIDE: 90 mmol/L — AB (ref 98–110)
CO2: 25 mmol/L (ref 20–31)
Creat: 8.66 mg/dL — ABNORMAL HIGH (ref 0.70–1.33)
GFR, Est African American: 7 mL/min — ABNORMAL LOW (ref >=60)
GFR, Est Non African American: 6 mL/min — ABNORMAL LOW (ref >=60)
Glucose, Bld: 272 mg/dL — ABNORMAL HIGH (ref 65–99)
POTASSIUM: 4.7 mmol/L (ref 3.5–5.3)
Sodium: 133 mmol/L — ABNORMAL LOW (ref 135–146)
Total Bilirubin: 0.5 mg/dL (ref 0.2–1.2)
Total Protein: 7.6 g/dL (ref 6.1–8.1)

## 2015-09-28 LAB — LDL CHOLESTEROL, DIRECT: LDL DIRECT: 122 mg/dL (ref <130)

## 2015-10-08 ENCOUNTER — Telehealth: Payer: Self-pay | Admitting: Family Medicine

## 2015-10-08 NOTE — Telephone Encounter (Signed)
Stephen Lane is calling from [facility] and they would like to have the last office notes faxed to them at [phone] attention [place]. He has an appointment on 10/11/15. Blima Rich

## 2015-10-08 NOTE — Telephone Encounter (Signed)
Note printed and faxed to [facility]at. Jazmin Hartsell,CMA

## 2017-04-18 ENCOUNTER — Ambulatory Visit: Payer: Medicare Other | Admitting: Internal Medicine

## 2017-04-19 ENCOUNTER — Encounter: Payer: Self-pay | Admitting: Internal Medicine

## 2017-04-19 ENCOUNTER — Ambulatory Visit (INDEPENDENT_AMBULATORY_CARE_PROVIDER_SITE_OTHER): Payer: Medicare Other | Admitting: Internal Medicine

## 2017-04-19 VITALS — BP 150/90 | HR 81 | Temp 98.0°F | Wt 154.8 lb

## 2017-04-19 DIAGNOSIS — E1122 Type 2 diabetes mellitus with diabetic chronic kidney disease: Secondary | ICD-10-CM

## 2017-04-19 DIAGNOSIS — Z114 Encounter for screening for human immunodeficiency virus [HIV]: Secondary | ICD-10-CM

## 2017-04-19 DIAGNOSIS — I10 Essential (primary) hypertension: Secondary | ICD-10-CM | POA: Diagnosis not present

## 2017-04-19 DIAGNOSIS — Z1159 Encounter for screening for other viral diseases: Secondary | ICD-10-CM | POA: Diagnosis not present

## 2017-04-19 LAB — POCT GLYCOSYLATED HEMOGLOBIN (HGB A1C): Hemoglobin A1C: 8.9

## 2017-04-19 MED ORDER — GLIPIZIDE ER 5 MG PO TB24: 5.0000 mg | ORAL_TABLET | Freq: Every day | ORAL | 0 refills | Status: DC

## 2017-04-19 NOTE — Patient Instructions (Addendum)
  Fue tan lindo verte! Hemos ordenado laboratorios de VIH, hepatitis B y hepatitis C para usted. Le llamaremos cuando esos resultados estn listos para ser recogidos en la recepcin. Tambin he enviado una referencia al [place] para [employer]. Debera escuchar de nuestra oficina en 2 semanas para programar esta cita.  Su A1c es 8.9%. Nos gustara que este nmero fuera menor de 7. He aumentado su Glipizide de 2.5 mg a 5 mg por da. Si tiene algn nivel bajo de azcar en la sangre despus de hacer esto, hgamelo saber.  Nos veremos de nuevo en la clnica en 3 meses!  -Dr. Brett Albino

## 2017-04-20 LAB — HIV ANTIBODY (ROUTINE TESTING W REFLEX): HIV SCREEN 4TH GENERATION: NONREACTIVE

## 2017-04-20 LAB — HEPATITIS B SURFACE ANTIBODY,QUALITATIVE: HEP B SURFACE AB, QUAL: REACTIVE

## 2017-04-20 LAB — HEPATITIS B SURFACE ANTIGEN: HEP B S AG: NEGATIVE

## 2017-04-20 LAB — HEPATITIS C ANTIBODY

## 2017-04-20 MED ORDER — AMLODIPINE BESYLATE 5 MG PO TABS: 10.0000 mg | ORAL_TABLET | Freq: Every day | ORAL | 2 refills | Status: DC

## 2017-04-20 NOTE — Assessment & Plan Note (Signed)
Uncontrolled. Last A1c 7.8%, increased to 8.9% today.  - Increase Glipizide from 2.$RemoveBeforeDE'5mg'ZrUhNmulNrqDbNj$  daily to $Remove'5mg'xerDDSw$  daily. May need to ultimately add on DPP-4 inhibitor (with dose adjustment), but will hold off on doing this right now, as patient is going to [place] for a few months and we will not be able monitor. - Continue checking blood sugars twice daily - Diabetic foot exam performed today- neuropathy present, but no ulcers - Ophthalmology referral placed- patient can hopefully have this done when he returns from [place] - Follow-up in a few months when he returns from [place]

## 2017-04-20 NOTE — Progress Notes (Signed)
   [facility] Phone: [phone]  Subjective:  Stephen Lane is a 56 year old male presenting to clinic for follow-up of his diabetes, HTN, and for pre-travel labs.  T2DM: Taking Glipizide 2.5mg  daily. No side effects. Checking blood sugars twice a day. Have been ranging from 100-145. No lows, no shakiness, no sweatiness. He does endorse numbness and tingling on the bottoms of his feet. No polyuria, no polydipsia.  HTN: Has taken Norvasc 10mg  in the past. Has not been taking this because he ran out. No chest pain, no shortness of breath, no lower extremity edema, no headaches.  Needs Labs for Travel: Traveling to [place] in 2 weeks. Needs HD while he is there. He was told that he would need to have HIV, Hep B, and Hep C labs before he was able to receive HD in [place]. No weight loss, no lymphadenopathy, no RUQ pain.  ROS: See HPI for pertinent positives and negatives  Past Medical History- CAD, HFpEF, T2DM with neuropathy, ESRD on HD, HLD  Family history reviewed for today's visit. No changes.  Social history- patient is a former smoker, quit in 1980  Objective: BP (!) 150/90   Pulse 81   Temp 98 F (36.7 C)   Wt 154 lb 12.8 oz (70.2 kg)   SpO2 98%   BMI 27.42 kg/m  Gen: NAD, alert, cooperative with exam HEENT: NCAT, EOMI, MMM Neck: FROM, supple CV: RRR, no murmur Resp: Normal work of breathing Msk: No lower extremity edema Diabetic Foot: No deformities, no ulcerations, no other skin breakdown bilaterally, PT and DP pulses intact bilaterally, sensation decreased to touch and monofilament testing throughout the entire bottom of the foot bilaterally Neuro: Awake, alert, oriented, no gross deficits, normal gait Psych: Normal affect, normal behavior Skin: No rashes or lesions on exposed skin  Assessment/Plan: T2DM: Uncontrolled. Last A1c 7.8%, increased to 8.9% today.  - Increase Glipizide from 2.5mg  daily to 5mg  daily. May need to ultimately add on DPP-4  inhibitor (with dose adjustment), but will hold off on doing this right now, as patient is going to [place] for a few months and we will not be able monitor. - Continue checking blood sugars twice daily - Diabetic foot exam performed today- neuropathy present, but no ulcers - Ophthalmology referral placed- patient can hopefully have this done when he returns from [place] - Follow-up in a few months when he returns from [place]  HTN: Uncontrolled. BP 150/90 in clinic today. Has been off his Norvasc for some time. - Restart Norvasc 10mg  daily- new prescription sent to pharmacy - Patient will be returning to clinic in the next few days to pick up his lab results. Will recheck blood pressure at that time. - Follow-up in 3 months  Travel Labs: Patient needs HIV, Hep B, and Hep C labs before he can receive HD in [place] - HIV, Hep B, and Hep C ordered   Hyman Bible, MD PGY-3

## 2017-04-20 NOTE — Assessment & Plan Note (Signed)
Uncontrolled. BP 150/90 in clinic today. Has been off his Norvasc for some time. - Restart Norvasc $RemoveBeforeD'10mg'RWntvElwNAmdMt$  daily- new prescription sent to pharmacy - Patient will be returning to clinic in the next few days to pick up his lab results. Will recheck blood pressure at that time. - Follow-up in 3 months

## 2017-07-17 ENCOUNTER — Emergency Department (HOSPITAL_COMMUNITY): Payer: Medicare Other

## 2017-07-17 ENCOUNTER — Encounter (HOSPITAL_COMMUNITY): Payer: Self-pay

## 2017-07-17 ENCOUNTER — Inpatient Hospital Stay (HOSPITAL_COMMUNITY)
Admission: EM | Admit: 2017-07-17 | Discharge: 2017-07-20 | DRG: 193 | Disposition: A | Discharge status: Home / Self Care | Payer: Medicare Other | Attending: Family Medicine | Admitting: Family Medicine

## 2017-07-17 DIAGNOSIS — Z87891 Personal history of nicotine dependence: Secondary | ICD-10-CM

## 2017-07-17 DIAGNOSIS — R0902 Hypoxemia: Secondary | ICD-10-CM | POA: Diagnosis present

## 2017-07-17 DIAGNOSIS — R0682 Tachypnea, not elsewhere classified: Secondary | ICD-10-CM

## 2017-07-17 DIAGNOSIS — Z7982 Long term (current) use of aspirin: Secondary | ICD-10-CM

## 2017-07-17 DIAGNOSIS — Z8249 Family history of ischemic heart disease and other diseases of the circulatory system: Secondary | ICD-10-CM

## 2017-07-17 DIAGNOSIS — Z7984 Long term (current) use of oral hypoglycemic drugs: Secondary | ICD-10-CM

## 2017-07-17 DIAGNOSIS — I132 Hypertensive heart and chronic kidney disease with heart failure and with stage 5 chronic kidney disease, or end stage renal disease: Secondary | ICD-10-CM | POA: Diagnosis present

## 2017-07-17 DIAGNOSIS — J9811 Atelectasis: Secondary | ICD-10-CM | POA: Diagnosis present

## 2017-07-17 DIAGNOSIS — Z992 Dependence on renal dialysis: Secondary | ICD-10-CM

## 2017-07-17 DIAGNOSIS — N186 End stage renal disease: Secondary | ICD-10-CM

## 2017-07-17 DIAGNOSIS — Z955 Presence of coronary angioplasty implant and graft: Secondary | ICD-10-CM

## 2017-07-17 DIAGNOSIS — Z833 Family history of diabetes mellitus: Secondary | ICD-10-CM

## 2017-07-17 DIAGNOSIS — E1122 Type 2 diabetes mellitus with diabetic chronic kidney disease: Secondary | ICD-10-CM | POA: Diagnosis present

## 2017-07-17 DIAGNOSIS — E1140 Type 2 diabetes mellitus with diabetic neuropathy, unspecified: Secondary | ICD-10-CM | POA: Diagnosis present

## 2017-07-17 DIAGNOSIS — N2581 Secondary hyperparathyroidism of renal origin: Secondary | ICD-10-CM | POA: Diagnosis present

## 2017-07-17 DIAGNOSIS — E782 Mixed hyperlipidemia: Secondary | ICD-10-CM | POA: Diagnosis present

## 2017-07-17 DIAGNOSIS — I2510 Atherosclerotic heart disease of native coronary artery without angina pectoris: Secondary | ICD-10-CM | POA: Diagnosis present

## 2017-07-17 DIAGNOSIS — I5032 Chronic diastolic (congestive) heart failure: Secondary | ICD-10-CM | POA: Diagnosis present

## 2017-07-17 DIAGNOSIS — I6523 Occlusion and stenosis of bilateral carotid arteries: Secondary | ICD-10-CM | POA: Diagnosis present

## 2017-07-17 DIAGNOSIS — J101 Influenza due to other identified influenza virus with other respiratory manifestations: Secondary | ICD-10-CM | POA: Diagnosis not present

## 2017-07-17 DIAGNOSIS — I10 Essential (primary) hypertension: Secondary | ICD-10-CM

## 2017-07-17 DIAGNOSIS — D649 Anemia, unspecified: Secondary | ICD-10-CM | POA: Diagnosis present

## 2017-07-17 HISTORY — DX: End stage renal disease: N18.6

## 2017-07-17 HISTORY — DX: Pneumonia, unspecified organism: J18.9

## 2017-07-17 HISTORY — DX: End stage renal disease: Z99.2

## 2017-07-17 LAB — BASIC METABOLIC PANEL
Anion gap: 17 — ABNORMAL HIGH (ref 5–15)
BUN: 40 mg/dL — AB (ref 6–20)
CALCIUM: 8.6 mg/dL — AB (ref 8.9–10.3)
CO2: 24 mmol/L (ref 22–32)
Chloride: 92 mmol/L — ABNORMAL LOW (ref 101–111)
Creatinine, Ser: 8.02 mg/dL — ABNORMAL HIGH (ref 0.61–1.24)
GFR calc Af Amer: 8 mL/min — ABNORMAL LOW (ref >60)
GFR calc non Af Amer: 7 mL/min — ABNORMAL LOW (ref >60)
GLUCOSE: 351 mg/dL — AB (ref 65–99)
Potassium: 3.9 mmol/L (ref 3.5–5.1)
Sodium: 133 mmol/L — ABNORMAL LOW (ref 135–145)

## 2017-07-17 LAB — CBC WITH DIFFERENTIAL/PLATELET
Basophils Absolute: 0 10*3/uL (ref 0.0–0.1)
Basophils Relative: 0 %
Eosinophils Absolute: 0 10*3/uL (ref 0.0–0.7)
Eosinophils Relative: 0 %
HEMATOCRIT: 36 % — AB (ref 39.0–52.0)
Hemoglobin: 12.3 g/dL — ABNORMAL LOW (ref 13.0–17.0)
LYMPHS PCT: 7 %
Lymphs Abs: 0.9 10*3/uL (ref 0.7–4.0)
MCH: 32.7 pg (ref 26.0–34.0)
MCHC: 34.2 g/dL (ref 30.0–36.0)
MCV: 95.7 fL (ref 78.0–100.0)
MONO ABS: 0.5 10*3/uL (ref 0.1–1.0)
MONOS PCT: 4 %
NEUTROS ABS: 10.9 10*3/uL — AB (ref 1.7–7.7)
Neutrophils Relative %: 89 %
PLATELETS: 169 10*3/uL (ref 150–400)
RBC: 3.76 MIL/uL — ABNORMAL LOW (ref 4.22–5.81)
RDW: 13.7 % (ref 11.5–15.5)
WBC: 12.2 10*3/uL — ABNORMAL HIGH (ref 4.0–10.5)

## 2017-07-17 MED ORDER — SODIUM CHLORIDE 0.9 % IV BOLUS (SEPSIS)
500.0000 mL | Freq: Once | INTRAVENOUS | Status: AC
  Administered 2017-07-18: 500 mL via INTRAVENOUS

## 2017-07-17 MED ORDER — ONDANSETRON 4 MG PO TBDP
4.0000 mg | ORAL_TABLET | Freq: Once | ORAL | Status: AC
  Administered 2017-07-17: 4 mg via ORAL
  Filled 2017-07-17: qty 1

## 2017-07-17 MED ORDER — ALBUTEROL SULFATE (2.5 MG/3ML) 0.083% IN NEBU
5.0000 mg | INHALATION_SOLUTION | Freq: Once | RESPIRATORY_TRACT | Status: AC
  Administered 2017-07-17: 5 mg via RESPIRATORY_TRACT
  Filled 2017-07-17: qty 6

## 2017-07-17 NOTE — ED Provider Notes (Signed)
[facility] EMERGENCY DEPARTMENT Provider Note   CSN: [id number] Arrival date & time: 07/17/17  1638     History   Chief Complaint No chief complaint on file.   HPI Stephen Lane is a 57 y.o. male.  Patient with a complicated medical history including CKD (baseline Cr 8) on HD (T, Th, S), CAD, T2DM, HTN, HLD presents with frontal headache, congestion, weakness, cough, nausea without vomiting, x 2 days. No sick contacts. No vomiting. He is eating and drinking less. He denies SOB, chest pain. He reports upper abdominal discomfort related to coughing only.    The history is provided by the patient and a relative. No language interpreter was used.    Past Medical History:  Diagnosis Date  . Carotid stenosis    a. Carotid US (05/2013):  1-39% bilateral ICA; brachial waveforms triphasic bilat; vertebrals with antegrade flow bilat; repeat 1 year  . Chronic high back pain    "where the lungs are located" (01/26/2015)  . CKD (chronic kidney disease) stage 3, GFR 30-59 ml/min (HCC)   . Coronary atherosclerosis    a. admx with [place] 11/13 => LHC 04/15/12: pLAD 50%, oD1 75% (small), pCFX 40% followed by mCFX 95%, pRCA 60%, mRCA 90%, EF 55-65%. PCI 04/15/12: DES to the [facility] and DES to the [facility].  . Daily headache   . Essential hypertension   . Mixed hyperlipidemia   . Type II diabetes mellitus [facility])     Patient Active Problem List   Diagnosis Date Noted  . Heart failure with preserved ejection fraction (HCC) 02/01/2015  . Type 2 diabetes mellitus with diabetic chronic kidney disease (HCC)   . ESRD (end stage renal disease) (HCC)   . Diabetic neuropathy (HCC) 06/04/2012  . Coronary Artery Disease 04/24/2012  . Hypertension, essential 04/24/2012  . Hyperlipidemia 04/17/2012  . GERD (gastroesophageal reflux disease) 04/15/2012  . Normocytic anemia 04/13/2012    Past Surgical History:  Procedure Laterality Date  . AV FISTULA PLACEMENT Left 01/28/2015   Procedure: LEFT BRACHIAL-CEPHALIC ARTERIOVENOUS (AV) FISTULA CREATION;  Surgeon: Pryor Ochoa, MD;  Location: [facility] OR;  Service: Vascular;  Laterality: Left;  . CATARACT EXTRACTION W/ INTRAOCULAR LENS IMPLANT Left   . CORONARY ANGIOPLASTY WITH STENT PLACEMENT  04/15/2012  . EYE SURGERY Right 2007   "blood behind eyeball"  . INSERTION OF DIALYSIS CATHETER N/A 01/28/2015   Procedure: ULTRASOUND GUIDED INSERTION OF HEMODIALYSIS CATHETER RIGHT IJ;  Surgeon: Pryor Ochoa, MD;  Location: [facility] OR;  Service: Vascular;  Laterality: N/A;  . LEFT HEART CATHETERIZATION WITH CORONARY ANGIOGRAM N/A 04/15/2012   Procedure: LEFT HEART CATHETERIZATION WITH CORONARY ANGIOGRAM;  Surgeon: Laurey Morale, MD;  Location: [facility] CATH LAB;  Service: Cardiovascular;  Laterality: N/A;  . LIGATION OF COMPETING BRANCHES OF ARTERIOVENOUS FISTULA Left 03/10/2015   Procedure: LIGATION OF COMPETING BRANCHES OF LEFT ARM BRACHIOCEPHALIC ARTERIOVENOUS FISTULA;  Surgeon: Pryor Ochoa, MD;  Location: [facility] OR;  Service: Vascular;  Laterality: Left;  . PERCUTANEOUS CORONARY STENT INTERVENTION (PCI-S) N/A 04/15/2012   Procedure: PERCUTANEOUS CORONARY STENT INTERVENTION (PCI-S);  Surgeon: Tonny Bollman, MD;  Location: [facility] CATH LAB;  Service: Cardiovascular;  Laterality: N/A;       Home Medications    Prior to Admission medications   Medication Sig Start Date End Date Taking? Authorizing Provider  amLODipine (NORVASC) 5 MG tablet Take 2 tablets (10 mg total) daily by mouth. ATTN: CHRIS 04/20/17   Mayo, Allyn Kenner, MD  aspirin EC 81 MG tablet  Take 1 tablet (81 mg total) by mouth daily. 09/27/15   Olam Idler, MD  atorvastatin (LIPITOR) 40 MG tablet Take 1 tablet (40 mg total) by mouth daily at 6 PM. 09/27/15   Olam Idler, MD  calcitRIOL (ROCALTROL) 0.25 MCG capsule Take 1 capsule (0.25 mcg total) by mouth daily. 02/04/15   Veatrice Bourbon, MD  calcium acetate (PHOSLO) 667 MG capsule Take 3 capsules (2,001 mg total) by mouth 3 (three)  times daily with meals. 02/04/15   Haney, Amedeo Plenty, MD  glipiZIDE (GLUCOTROL XL) 5 MG 24 hr tablet Take 1 tablet (5 mg total) daily with breakfast by mouth. 04/19/17   Mayo, Pete Pelt, MD    Family History Family History  Problem Relation Age of Onset  . Hypertension Mother        died 54 years old unknown reasons  . Other Father        died when [place] was 60 years old from "infection"  . Diabetes Brother   . Hypertension Sister     Social History Social History   Tobacco Use  . Smoking status: Former Smoker    Packs/day: 0.25    Years: 10.00    Pack years: 2.50    Types: Cigarettes  . Smokeless tobacco: Never Used  . Tobacco comment: "quit smoking cigarettes in the 1980's"  Substance Use Topics  . Alcohol use: Yes    Comment: "quit drinking in ~ 2010"  . Drug use: No     Allergies   Patient has no known allergies.   Review of Systems Review of Systems  Constitutional: Negative for chills and fever.  HENT: Positive for congestion, sinus pressure, sinus pain and sore throat. Negative for trouble swallowing.   Respiratory: Positive for cough. Negative for shortness of breath.   Cardiovascular: Negative.  Negative for chest pain.  Gastrointestinal: Positive for abdominal pain (Epigastric discomfort related to cough) and nausea. Negative for diarrhea and rectal pain.  Musculoskeletal: Negative.  Negative for myalgias.  Skin: Negative.   Neurological: Positive for weakness and headaches. Negative for syncope.     Physical Exam Updated Vital Signs BP 129/65 (BP Location: Right Arm)   Pulse 91   Temp 98.2 F (36.8 C) (Oral)   Resp 16   Ht $R'5\' 3"'oj$  (1.6 m)   Wt 69 kg (152 lb 1.9 oz)   SpO2 97%   BMI 26.95 kg/m   Physical Exam  Constitutional: He is oriented to person, place, and time. He appears well-developed and well-nourished.  HENT:  Head: Normocephalic.  Nose: Mucosal edema present. Right sinus exhibits maxillary sinus tenderness and frontal sinus  tenderness. Left sinus exhibits maxillary sinus tenderness and frontal sinus tenderness.  Mouth/Throat: Uvula is midline. Mucous membranes are dry. No posterior oropharyngeal edema or posterior oropharyngeal erythema.  Neck: Normal range of motion. Neck supple.  Cardiovascular: Normal rate and regular rhythm.  No murmur heard. Pulmonary/Chest: Effort normal. He has no wheezes. He has rales. He exhibits no tenderness.  Abdominal: Soft. Bowel sounds are normal. There is no tenderness. There is no rebound and no guarding.  Musculoskeletal: Normal range of motion.  Neurological: He is alert and oriented to person, place, and time.  Skin: Skin is warm and dry. No rash noted.  Psychiatric: He has a normal mood and affect.     ED Treatments / Results  Labs (all labs ordered are listed, but only abnormal results are displayed) Labs Reviewed  CBC WITH DIFFERENTIAL/PLATELET - Abnormal; Notable  for the following components:      Result Value   WBC 12.2 (*)    RBC 3.76 (*)    Hemoglobin 12.3 (*)    HCT 36.0 (*)    Neutro Abs 10.9 (*)    All other components within normal limits  BASIC METABOLIC PANEL - Abnormal; Notable for the following components:   Sodium 133 (*)    Chloride 92 (*)    Glucose, Bld 351 (*)    BUN 40 (*)    Creatinine, Ser 8.02 (*)    Calcium 8.6 (*)    GFR calc non Af Amer 7 (*)    GFR calc Af Amer 8 (*)    Anion gap 17 (*)    All other components within normal limits  LACTIC ACID, PLASMA  LACTIC ACID, PLASMA  INFLUENZA PANEL BY PCR (TYPE A & B)    EKG  EKG Interpretation None       Radiology Dg Chest 2 View  Result Date: 07/17/2017 CLINICAL DATA:  Cough, weakness, dizziness EXAM: CHEST  2 VIEW COMPARISON:  01/29/2015 FINDINGS: Low lung volumes with bibasilar atelectasis. Heart size is accentuated by the low volumes, borderline. Mild vascular congestion. No acute bony abnormality. IMPRESSION: Low lung volumes with bibasilar atelectasis and vascular  congestion. Electronically Signed   By: Rolm Baptise M.D.   On: 07/17/2017 18:03    Procedures Procedures (including critical care time)  Medications Ordered in ED Medications  sodium chloride 0.9 % bolus 500 mL (not administered)  albuterol (PROVENTIL) (2.5 MG/3ML) 0.083% nebulizer solution 5 mg (not administered)  ondansetron (ZOFRAN-ODT) disintegrating tablet 4 mg (4 mg Oral Given 07/17/17 1651)     Initial Impression / Assessment and Plan / ED Course  I have reviewed the triage vital signs and the nursing notes.  Pertinent labs & imaging results that were available during my care of the patient were reviewed by me and considered in my medical decision making (see chart for details).     Patient BIB family with concern for SOB, congestion, headache that started as frontal and is now generalized and cough. He is a dialysis patient and reports compliance. He denies chest pain.  The patient is given light IVF's and reports this helps him feel better. He has been persistently tachypneic. Albuterol nebulizer given for SOB and congestion, again with improvement.   He is found to be positive for influenza A. His oxygenation is fluctuating between upper 80's and lower 90's. Admission based on persistent tachypnea, low O2 saturations and Flu + patient considered high risk. Discussed with [facility] who accepts the patient for admission  Final Clinical Impressions(s) / ED Diagnoses   Final diagnoses:  None   1. Influenza A 2. Tachypnea 3. Hypoxia  ED Discharge Orders    None       Charlann Lange, PA-C 07/18/17 0356    Pattricia Boss, MD 07/21/17 [phone]

## 2017-07-17 NOTE — ED Triage Notes (Signed)
Pt presents with 3 day h/o frontal headache that is now generalized, and dry cough.  Pt denies any shortness of breath. Had HD today.

## 2017-07-18 ENCOUNTER — Encounter (HOSPITAL_COMMUNITY): Payer: Self-pay | Admitting: General Practice

## 2017-07-18 ENCOUNTER — Other Ambulatory Visit: Payer: Self-pay

## 2017-07-18 DIAGNOSIS — Z87891 Personal history of nicotine dependence: Secondary | ICD-10-CM | POA: Diagnosis not present

## 2017-07-18 DIAGNOSIS — R0682 Tachypnea, not elsewhere classified: Secondary | ICD-10-CM | POA: Diagnosis present

## 2017-07-18 DIAGNOSIS — N186 End stage renal disease: Secondary | ICD-10-CM | POA: Diagnosis present

## 2017-07-18 DIAGNOSIS — R0902 Hypoxemia: Secondary | ICD-10-CM

## 2017-07-18 DIAGNOSIS — Z955 Presence of coronary angioplasty implant and graft: Secondary | ICD-10-CM | POA: Diagnosis not present

## 2017-07-18 DIAGNOSIS — I2510 Atherosclerotic heart disease of native coronary artery without angina pectoris: Secondary | ICD-10-CM | POA: Diagnosis present

## 2017-07-18 DIAGNOSIS — J101 Influenza due to other identified influenza virus with other respiratory manifestations: Principal | ICD-10-CM

## 2017-07-18 DIAGNOSIS — E1140 Type 2 diabetes mellitus with diabetic neuropathy, unspecified: Secondary | ICD-10-CM | POA: Diagnosis present

## 2017-07-18 DIAGNOSIS — I5032 Chronic diastolic (congestive) heart failure: Secondary | ICD-10-CM | POA: Diagnosis present

## 2017-07-18 DIAGNOSIS — I10 Essential (primary) hypertension: Secondary | ICD-10-CM

## 2017-07-18 DIAGNOSIS — Z7982 Long term (current) use of aspirin: Secondary | ICD-10-CM | POA: Diagnosis not present

## 2017-07-18 DIAGNOSIS — E782 Mixed hyperlipidemia: Secondary | ICD-10-CM | POA: Diagnosis present

## 2017-07-18 DIAGNOSIS — Z7984 Long term (current) use of oral hypoglycemic drugs: Secondary | ICD-10-CM | POA: Diagnosis not present

## 2017-07-18 DIAGNOSIS — I132 Hypertensive heart and chronic kidney disease with heart failure and with stage 5 chronic kidney disease, or end stage renal disease: Secondary | ICD-10-CM | POA: Diagnosis present

## 2017-07-18 DIAGNOSIS — D649 Anemia, unspecified: Secondary | ICD-10-CM | POA: Diagnosis present

## 2017-07-18 DIAGNOSIS — Z8249 Family history of ischemic heart disease and other diseases of the circulatory system: Secondary | ICD-10-CM | POA: Diagnosis not present

## 2017-07-18 DIAGNOSIS — Z992 Dependence on renal dialysis: Secondary | ICD-10-CM | POA: Diagnosis not present

## 2017-07-18 DIAGNOSIS — Z833 Family history of diabetes mellitus: Secondary | ICD-10-CM | POA: Diagnosis not present

## 2017-07-18 DIAGNOSIS — J9811 Atelectasis: Secondary | ICD-10-CM | POA: Diagnosis present

## 2017-07-18 DIAGNOSIS — E1122 Type 2 diabetes mellitus with diabetic chronic kidney disease: Secondary | ICD-10-CM | POA: Diagnosis present

## 2017-07-18 DIAGNOSIS — N2581 Secondary hyperparathyroidism of renal origin: Secondary | ICD-10-CM | POA: Diagnosis present

## 2017-07-18 DIAGNOSIS — I6523 Occlusion and stenosis of bilateral carotid arteries: Secondary | ICD-10-CM | POA: Diagnosis present

## 2017-07-18 LAB — BASIC METABOLIC PANEL
ANION GAP: 18 — AB (ref 5–15)
BUN: 48 mg/dL — ABNORMAL HIGH (ref 6–20)
CALCIUM: 8 mg/dL — AB (ref 8.9–10.3)
CO2: 23 mmol/L (ref 22–32)
Chloride: 93 mmol/L — ABNORMAL LOW (ref 101–111)
Creatinine, Ser: 9.08 mg/dL — ABNORMAL HIGH (ref 0.61–1.24)
GFR, EST AFRICAN AMERICAN: 7 mL/min — AB (ref >60)
GFR, EST NON AFRICAN AMERICAN: 6 mL/min — AB (ref >60)
GLUCOSE: 220 mg/dL — AB (ref 65–99)
POTASSIUM: 3.9 mmol/L (ref 3.5–5.1)
Sodium: 134 mmol/L — ABNORMAL LOW (ref 135–145)

## 2017-07-18 LAB — CBC
HEMATOCRIT: 32.9 % — AB (ref 39.0–52.0)
Hemoglobin: 11.2 g/dL — ABNORMAL LOW (ref 13.0–17.0)
MCH: 32.7 pg (ref 26.0–34.0)
MCHC: 34 g/dL (ref 30.0–36.0)
MCV: 96.2 fL (ref 78.0–100.0)
PLATELETS: 151 10*3/uL (ref 150–400)
RBC: 3.42 MIL/uL — AB (ref 4.22–5.81)
RDW: 13.7 % (ref 11.5–15.5)
WBC: 9.4 10*3/uL (ref 4.0–10.5)

## 2017-07-18 LAB — LIPID PANEL
Cholesterol: 191 mg/dL (ref 0–200)
HDL: 38 mg/dL — ABNORMAL LOW (ref >40)
LDL Cholesterol: 110 mg/dL — ABNORMAL HIGH (ref 0–99)
Total CHOL/HDL Ratio: 5 RATIO
Triglycerides: 215 mg/dL — ABNORMAL HIGH (ref <150)
VLDL: 43 mg/dL — ABNORMAL HIGH (ref 0–40)

## 2017-07-18 LAB — HEMOGLOBIN A1C
Hgb A1c MFr Bld: 7.9 % — ABNORMAL HIGH (ref 4.8–5.6)
MEAN PLASMA GLUCOSE: 180.03 mg/dL

## 2017-07-18 LAB — CBG MONITORING, ED
GLUCOSE-CAPILLARY: 163 mg/dL — AB (ref 65–99)
GLUCOSE-CAPILLARY: 211 mg/dL — AB (ref 65–99)
Glucose-Capillary: 190 mg/dL — ABNORMAL HIGH (ref 65–99)

## 2017-07-18 LAB — GLUCOSE, CAPILLARY
GLUCOSE-CAPILLARY: 132 mg/dL — AB (ref 65–99)
Glucose-Capillary: 161 mg/dL — ABNORMAL HIGH (ref 65–99)

## 2017-07-18 LAB — MRSA PCR SCREENING: MRSA by PCR: NEGATIVE

## 2017-07-18 LAB — INFLUENZA PANEL BY PCR (TYPE A & B)
INFLAPCR: POSITIVE — AB
Influenza B By PCR: NEGATIVE

## 2017-07-18 LAB — LACTIC ACID, PLASMA
Lactic Acid, Venous: 1.3 mmol/L (ref 0.5–1.9)
Lactic Acid, Venous: 1.8 mmol/L (ref 0.5–1.9)

## 2017-07-18 MED ORDER — ACETAMINOPHEN 650 MG RE SUPP: 650.0000 mg | Freq: Four times a day (QID) | RECTAL | Status: DC | PRN

## 2017-07-18 MED ORDER — BENZONATATE 200 MG PO CAPS: 200.0000 mg | ORAL_CAPSULE | Freq: Three times a day (TID) | ORAL | 0 refills | Status: AC | PRN

## 2017-07-18 MED ORDER — OSELTAMIVIR PHOSPHATE 30 MG PO CAPS: 30.0000 mg | ORAL_CAPSULE | ORAL | 0 refills | Status: AC

## 2017-07-18 MED ORDER — HEPARIN SODIUM (PORCINE) 5000 UNIT/ML IJ SOLN
5000.0000 [IU] | Freq: Three times a day (TID) | INTRAMUSCULAR | Status: DC
  Administered 2017-07-18 – 2017-07-20 (×6): 5000 [IU] via SUBCUTANEOUS
  Filled 2017-07-18 (×5): qty 1

## 2017-07-18 MED ORDER — POLYETHYLENE GLYCOL 3350 17 G PO PACK: 17.0000 g | PACK | Freq: Every day | ORAL | Status: DC | PRN

## 2017-07-18 MED ORDER — AMLODIPINE BESYLATE 10 MG PO TABS
10.0000 mg | ORAL_TABLET | Freq: Every day | ORAL | Status: DC
  Administered 2017-07-18: 10 mg via ORAL
  Filled 2017-07-18: qty 2

## 2017-07-18 MED ORDER — INSULIN ASPART 100 UNIT/ML ~~LOC~~ SOLN: 0.0000 [IU] | Freq: Every day | SUBCUTANEOUS | Status: DC

## 2017-07-18 MED ORDER — OSELTAMIVIR PHOSPHATE 30 MG PO CAPS
30.0000 mg | ORAL_CAPSULE | ORAL | Status: DC
  Administered 2017-07-20: 30 mg via ORAL
  Filled 2017-07-18: qty 1

## 2017-07-18 MED ORDER — INSULIN ASPART 100 UNIT/ML ~~LOC~~ SOLN
0.0000 [IU] | Freq: Three times a day (TID) | SUBCUTANEOUS | Status: DC
  Administered 2017-07-18: 1 [IU] via SUBCUTANEOUS
  Administered 2017-07-18: 2 [IU] via SUBCUTANEOUS
  Administered 2017-07-18: 3 [IU] via SUBCUTANEOUS
  Administered 2017-07-19: 1 [IU] via SUBCUTANEOUS
  Administered 2017-07-19 – 2017-07-20 (×2): 2 [IU] via SUBCUTANEOUS
  Filled 2017-07-18 (×2): qty 1

## 2017-07-18 MED ORDER — SUCROFERRIC OXYHYDROXIDE 500 MG PO CHEW
500.0000 mg | CHEWABLE_TABLET | Freq: Three times a day (TID) | ORAL | Status: DC
  Administered 2017-07-18 – 2017-07-19 (×3): 500 mg via ORAL
  Filled 2017-07-18 (×6): qty 1

## 2017-07-18 MED ORDER — CALCITRIOL 0.25 MCG PO CAPS
0.2500 ug | ORAL_CAPSULE | Freq: Every day | ORAL | Status: DC
  Administered 2017-07-18: 0.25 ug via ORAL
  Filled 2017-07-18: qty 1

## 2017-07-18 MED ORDER — ATORVASTATIN CALCIUM 40 MG PO TABS
40.0000 mg | ORAL_TABLET | Freq: Every day | ORAL | Status: DC
  Administered 2017-07-18 – 2017-07-19 (×2): 40 mg via ORAL
  Filled 2017-07-18 (×3): qty 1

## 2017-07-18 MED ORDER — ACETAMINOPHEN 325 MG PO TABS
650.0000 mg | ORAL_TABLET | Freq: Four times a day (QID) | ORAL | Status: DC | PRN
  Administered 2017-07-18 – 2017-07-20 (×4): 650 mg via ORAL
  Filled 2017-07-18 (×5): qty 2

## 2017-07-18 MED ORDER — OSELTAMIVIR PHOSPHATE 30 MG PO CAPS
30.0000 mg | ORAL_CAPSULE | Freq: Once | ORAL | Status: AC
  Administered 2017-07-18: 30 mg via ORAL
  Filled 2017-07-18: qty 1

## 2017-07-18 MED ORDER — ALBUTEROL SULFATE (2.5 MG/3ML) 0.083% IN NEBU
2.5000 mg | INHALATION_SOLUTION | Freq: Four times a day (QID) | RESPIRATORY_TRACT | Status: DC | PRN
  Administered 2017-07-18 – 2017-07-19 (×2): 2.5 mg via RESPIRATORY_TRACT
  Filled 2017-07-18 (×2): qty 3

## 2017-07-18 MED ORDER — ASPIRIN EC 81 MG PO TBEC
81.0000 mg | DELAYED_RELEASE_TABLET | Freq: Every day | ORAL | Status: DC
  Administered 2017-07-18 – 2017-07-19 (×2): 81 mg via ORAL
  Filled 2017-07-18: qty 1

## 2017-07-18 MED ORDER — PHENOL 1.4 % MT LIQD: 1.0000 | OROMUCOSAL | Status: DC | PRN

## 2017-07-18 MED ORDER — BENZONATATE 100 MG PO CAPS: 200.0000 mg | ORAL_CAPSULE | Freq: Three times a day (TID) | ORAL | Status: DC | PRN

## 2017-07-18 NOTE — H&P (Signed)
[facility] Admission History and Physical Service Pager: [phone]  Patient name: Stephen Lane Medical record number: [id number] Date of birth: [birth year] Age: 57 y.o. Gender: male  Primary Care Provider: Mayo, Pete Pelt, MD Consultants: None Code Status: Full  Chief Complaint: fevers, chills, body aches  Assessment and Plan: Stephen Lane is a 57 y.o. male presenting with influenza. PMH is significant for HTN, T2DM, CAD, HLD, ESRD on HD TTS, HFpEF.  Hypoxia secondary to influenza A: Patient with fevers, chills, body aches, and URI symptoms. Tested positive for influenza A in the ED. Having desaturations to 87% on RA and required 2L O2 by Stephen Lane to maintain saturations in the 90s. CXR without signs of a superimposed pneumonia, but does show some vascular congestion so volume overload may be contributing. - Admit to med-surg under observation status, attending Dr. Gwendlyn Deutscher - Start Tamiflu $RemoveBefor'30mg'BFmZyIVHfOrV$  now, then $RemoveB'30mg'cNTmWZAt$  after every HD session for 5 days, per up-to-date recommendations for ESRD patients - Albuterol nebulizer q6hrs prn for shortness of breath/wheezing - Tylenol $RemoveB'650mg'FRLweopb$  q6hrs prn for fevers/muscle aches - Tessalon $RemoveBe'200mg'AsVQrtMYy$  tid prn for cough - Chloraseptic spray for sore throat - Wean O2 as tolerated - Continuous pulse ox while on O2 - Will likely need to ambulate with pulse ox prior to discharge.  HTN: Normotensive to mildly hypertensive on admission. - Continue home Norvasc $RemoveBefo'10mg'GhHmLViIPDG$  daily  T2DM: On Glipizide $RemoveBefo'5mg'vZBbkkRoYmC$  daily. Last A1c was 8.9% in 04/2017. - CBGs tid with meals and at bedtime - Sensitive SSI - Recheck A1c - Consider starting Januvia as an outpatient  CAD: Stable. No chest pain. - Continue home Aspirin $RemoveBefo'81mg'dfBKLXSgsRq$  daily and Lipitor $RemoveBef'40mg'RqFUcZHwpy$  daily.  ESRD on HD: Receives HD TTS. Had to stop last session 30 minute early because he felt too bad. - Consult Nephrology if patient will still be here on 2/14 - Continue home Calcitriol 0.22mcg daily and  Velphoro $RemoveB'500mg'ADGEzKzY$  tid  HFpEF: Last ECHO 01/29/2015 with hypokinesis of the distal lateral wall with EF 50-55%, G2DD, PA peak pressure 45. CXR showing mild vascular congestion. No LE edema, bibasilar crackles, or JVD on exam. Weight is 2lbs less than his dry weight. - Continue to monitor - Extra volume to be removed by HD  HLD: Last lipid panel 01/2015 with Chol 178, HDL 34, LDL 124, TG 100.  - Continue home Lipitor $RemoveBefo'40mg'UUGwHisTHuM$  daily - Recheck lipid panel  FEN/GI: Heart healthy carb-modified diet Prophylaxis: Heparin sq  Disposition: Admit to med-surg under observation status. Anticipate discharge home in 1-2 days pending ability to wean off oxygen.  History of Present Illness:  Stephen Lane is a 57 y.o. male presenting with dry cough, rhinorrhea, sore throat, fevers, and chills starting two days ago. He kept feeling worse and worse. He then developed muscle aches. He tried taking Dayquil and Tylenol at home, which helped for an hour and then wore off. He was able to go to HD yesterday, but had to cut the session 30 minutes short because he was feeling so bad. He denies any shortness of breath, but his family has heard him wheezing. No chest pain. He endorses abdominal pain and nausea. No vomiting, no diarrhea. He has not eaten or drank anything except for a few sips of gatorade in the last 24 hours. He received a flu shot on 02/03/17.  In the ED, he was mildly hypertensive to 158/83 with HRs in the 90s. He was afebrile. O2 saturations dropped to 87% on room air and patient was placed on 2L  O2 by . Labs were significant for Na 133, glucose 351, Cr 8.02, WBC 12.2, Hgb 12.3, lactic acid 1.8>1.3, influenza A positive. CXR with bibasilar atelectasis and vascular congestion.  Review Of Systems: Per HPI with the following additions: see below  Review of Systems  Constitutional: Negative for chills and fever.  HENT: Positive for congestion and sore throat.   Eyes: Negative for blurred vision and double  vision.  Respiratory: Positive for cough, shortness of breath and wheezing.   Cardiovascular: Negative for chest pain.  Gastrointestinal: Positive for abdominal pain. Negative for diarrhea, nausea and vomiting.  Genitourinary: Negative for dysuria and urgency.  Musculoskeletal: Positive for myalgias. Negative for joint pain.  Neurological: Positive for headaches. Negative for dizziness.    Patient Active Problem List   Diagnosis Date Noted  . Heart failure with preserved ejection fraction ([place]) 02/01/2015  . Type 2 diabetes mellitus with diabetic chronic kidney disease ([place])   . ESRD (end stage renal disease) ([place])   . Diabetic neuropathy ([place]) 06/04/2012  . Coronary Artery Disease 04/24/2012  . Hypertension, essential 04/24/2012  . Hyperlipidemia 04/17/2012  . GERD (gastroesophageal reflux disease) 04/15/2012  . Normocytic anemia 04/13/2012    Past Medical History: Past Medical History:  Diagnosis Date  . Carotid stenosis    a. Carotid US (05/2013):  1-39% bilateral ICA; brachial waveforms triphasic bilat; vertebrals with antegrade flow bilat; repeat 1 year  . Chronic high back pain    "where the lungs are located" (01/26/2015)  . CKD (chronic kidney disease) stage 3, GFR 30-59 ml/min (HCC)   . Coronary atherosclerosis    a. admx with [place] 11/13 => LHC 04/15/12: pLAD 50%, oD1 75% (small), pCFX 40% followed by mCFX 95%, pRCA 60%, mRCA 90%, EF 55-65%. PCI 04/15/12: DES to the [facility] and DES to the [facility].  . Daily headache   . Essential hypertension   . Mixed hyperlipidemia   . Type II diabetes mellitus ([place])     Past Surgical History: Past Surgical History:  Procedure Laterality Date  . AV FISTULA PLACEMENT Left 01/28/2015   Procedure: LEFT BRACHIAL-CEPHALIC ARTERIOVENOUS (AV) FISTULA CREATION;  Surgeon: Mal Misty, MD;  Location: [place];  Service: Vascular;  Laterality: Left;  . CATARACT EXTRACTION W/ INTRAOCULAR LENS IMPLANT Left   . CORONARY ANGIOPLASTY WITH STENT PLACEMENT   04/15/2012  . EYE SURGERY Right 2007   "blood behind eyeball"  . INSERTION OF DIALYSIS CATHETER N/A 01/28/2015   Procedure: ULTRASOUND GUIDED INSERTION OF HEMODIALYSIS CATHETER RIGHT IJ;  Surgeon: Mal Misty, MD;  Location: [place];  Service: Vascular;  Laterality: N/A;  . LEFT HEART CATHETERIZATION WITH CORONARY ANGIOGRAM N/A 04/15/2012   Procedure: LEFT HEART CATHETERIZATION WITH CORONARY ANGIOGRAM;  Surgeon: Larey Dresser, MD;  Location[facility] CATH LAB;  Service: Cardiovascular;  Laterality: N/A;  . LIGATION OF COMPETING BRANCHES OF ARTERIOVENOUS FISTULA Left 03/10/2015   Procedure: LIGATION OF COMPETING BRANCHES OF LEFT ARM BRACHIOCEPHALIC ARTERIOVENOUS FISTULA;  Surgeon: Mal Misty, MD;  Location: [place];  Service: Vascular;  Laterality: Left;  . PERCUTANEOUS CORONARY STENT INTERVENTION (PCI-S) N/A 04/15/2012   Procedure: PERCUTANEOUS CORONARY STENT INTERVENTION (PCI-S);  Surgeon: Sherren Mocha, MD;  Location: [facility] CATH LAB;  Service: Cardiovascular;  Laterality: N/A;    Social History: Social History   Tobacco Use  . Smoking status: Former Smoker    Packs/day: 0.25    Years: 10.00    Pack years: 2.50    Types: Cigarettes  . Smokeless tobacco: Never Used  .  Tobacco comment: "quit smoking cigarettes in the 1980's"  Substance Use Topics  . Alcohol use: Yes    Comment: "quit drinking in ~ 2010"  . Drug use: No   Additional social history: occasional alcohol use  Please also refer to relevant sections of EMR.  Family History: Family History  Problem Relation Age of Onset  . Hypertension Mother        died 65 years old unknown reasons  . Other Father        died when [place] was 45 years old from "infection"  . Diabetes Brother   . Hypertension Sister     Allergies and Medications: No Known Allergies No current facility-administered medications on file prior to encounter.    Current Outpatient Medications on File Prior to Encounter  Medication Sig Dispense Refill   . amLODipine (NORVASC) 5 MG tablet Take 2 tablets (10 mg total) daily by mouth. ATTN: CHRIS 60 tablet 2  . aspirin EC 81 MG tablet Take 1 tablet (81 mg total) by mouth daily. 90 tablet 3  . atorvastatin (LIPITOR) 40 MG tablet Take 1 tablet (40 mg total) by mouth daily at 6 PM. 90 tablet 2  . calcitRIOL (ROCALTROL) 0.25 MCG capsule Take 1 capsule (0.25 mcg total) by mouth daily. 30 capsule 3  . glipiZIDE (GLUCOTROL XL) 5 MG 24 hr tablet Take 1 tablet (5 mg total) daily with breakfast by mouth. 90 tablet 0  . VELPHORO 500 MG chewable tablet Chew 500 mg by mouth 3 (three) times daily.    . calcium acetate (PHOSLO) 667 MG capsule Take 3 capsules (2,001 mg total) by mouth 3 (three) times daily with meals. (Patient not taking: Reported on 07/18/2017) 30 capsule 3    Objective: BP 137/75 (BP Location: Right Arm)   Pulse 93   Temp 98.3 F (36.8 C) (Oral)   Resp (!) 26   Ht $R'5\' 3"'xp$  (1.6 m)   Wt 152 lb 1.9 oz (69 kg)   SpO2 (!) 87%   BMI 26.95 kg/m  Exam: General: Ill-appearing, diaphoretic, alert Eyes: EOMI, PERRLA ENTM: Nose normal, oropharynx mildly erythematous, MMM Neck: Supple, full ROM, no JVD Cardiovascular: Tachycardic, regular rhythm, no murmurs Respiratory: Rhonchi and expiratory wheezes heard throughout all lung fields, no crackles, normal work of breathing, mildly increased RR, [place] in place Gastrointestinal: +BS, soft, non-tender, non-distended, no rebound, no guarding MSK: LUE fistula that is normal in appearance with palpable thrill Derm: No rashes or lesions on exposed skin Neuro: Awake, alert, oriented, no focal deficits Psych: Appropriate affect, normal behavior  Labs and Imaging: CBC BMET  Recent Labs  Lab 07/17/17 1651  WBC 12.2*  HGB 12.3*  HCT 36.0*  PLT 169   Recent Labs  Lab 07/17/17 1651  NA 133*  K 3.9  CL 92*  CO2 24  BUN 40*  CREATININE 8.02*  GLUCOSE 351*  CALCIUM 8.6*     -Lactic acid 1.8 -Influenza A positive -CXR with bibasilar atelectasis  and vascular congestion.  Mayo, Pete Pelt, MD 07/18/2017, 3:03 AM PGY-3, [place] [facility] pager: [phone], text pages welcome

## 2017-07-18 NOTE — ED Notes (Signed)
Family notified of patient room transfer [id number]

## 2017-07-18 NOTE — Progress Notes (Signed)
O2 sat at rest- 95-96% on RA. O2 while ambulating - 92-93% on RA. He denies any SOB or discomfort.

## 2017-07-18 NOTE — ED Notes (Signed)
Pt noted on monitor to have O2 sats at 82% on RA, patient family states admitting removed oxygen from patient. Cannula placed back on to maintain O2 sats WNL.

## 2017-07-19 DIAGNOSIS — R0682 Tachypnea, not elsewhere classified: Secondary | ICD-10-CM

## 2017-07-19 LAB — BASIC METABOLIC PANEL
ANION GAP: 20 — AB (ref 5–15)
BUN: 66 mg/dL — ABNORMAL HIGH (ref 6–20)
CALCIUM: 7.9 mg/dL — AB (ref 8.9–10.3)
CO2: 23 mmol/L (ref 22–32)
Chloride: 88 mmol/L — ABNORMAL LOW (ref 101–111)
Creatinine, Ser: 11.7 mg/dL — ABNORMAL HIGH (ref 0.61–1.24)
GFR calc Af Amer: 5 mL/min — ABNORMAL LOW (ref >60)
GFR, EST NON AFRICAN AMERICAN: 4 mL/min — AB (ref >60)
GLUCOSE: 154 mg/dL — AB (ref 65–99)
Potassium: 4.6 mmol/L (ref 3.5–5.1)
Sodium: 131 mmol/L — ABNORMAL LOW (ref 135–145)

## 2017-07-19 LAB — CBC
HEMATOCRIT: 31.9 % — AB (ref 39.0–52.0)
Hemoglobin: 10.6 g/dL — ABNORMAL LOW (ref 13.0–17.0)
MCH: 31.6 pg (ref 26.0–34.0)
MCHC: 33.2 g/dL (ref 30.0–36.0)
MCV: 95.2 fL (ref 78.0–100.0)
PLATELETS: 139 10*3/uL — AB (ref 150–400)
RBC: 3.35 MIL/uL — ABNORMAL LOW (ref 4.22–5.81)
RDW: 13.6 % (ref 11.5–15.5)
WBC: 10.7 10*3/uL — AB (ref 4.0–10.5)

## 2017-07-19 LAB — GLUCOSE, CAPILLARY
Glucose-Capillary: 140 mg/dL — ABNORMAL HIGH (ref 65–99)
Glucose-Capillary: 117 mg/dL — ABNORMAL HIGH (ref 65–99)
Glucose-Capillary: 197 mg/dL — ABNORMAL HIGH (ref 65–99)
Glucose-Capillary: 166 mg/dL — ABNORMAL HIGH (ref 65–99)

## 2017-07-19 MED ORDER — AMLODIPINE BESYLATE 5 MG PO TABS: 5.0000 mg | ORAL_TABLET | Freq: Every day | ORAL | 0 refills | Status: DC

## 2017-07-19 MED ORDER — AMLODIPINE BESYLATE 5 MG PO TABS
5.0000 mg | ORAL_TABLET | Freq: Every day | ORAL | Status: DC
  Administered 2017-07-19: 5 mg via ORAL

## 2017-07-19 MED ORDER — LIDOCAINE-PRILOCAINE 2.5-2.5 % EX CREA: 1.0000 "application " | TOPICAL_CREAM | CUTANEOUS | Status: DC | PRN

## 2017-07-19 MED ORDER — CALCIUM ACETATE (PHOS BINDER) 667 MG PO CAPS
2001.0000 mg | ORAL_CAPSULE | Freq: Three times a day (TID) | ORAL | Status: DC
  Administered 2017-07-19 – 2017-07-20 (×3): 2001 mg via ORAL
  Filled 2017-07-19 (×2): qty 3

## 2017-07-19 MED ORDER — SUCROFERRIC OXYHYDROXIDE 500 MG PO CHEW
500.0000 mg | CHEWABLE_TABLET | Freq: Three times a day (TID) | ORAL | Status: DC
  Administered 2017-07-19 – 2017-07-20 (×3): 500 mg via ORAL
  Filled 2017-07-19 (×4): qty 1

## 2017-07-19 MED ORDER — ALTEPLASE 2 MG IJ SOLR: 2.0000 mg | Freq: Once | INTRAMUSCULAR | Status: DC | PRN

## 2017-07-19 MED ORDER — SODIUM CHLORIDE 0.9 % IV SOLN: 100.0000 mL | INTRAVENOUS | Status: DC | PRN

## 2017-07-19 MED ORDER — HEPARIN SODIUM (PORCINE) 1000 UNIT/ML DIALYSIS
6500.0000 [IU] | Freq: Once | INTRAMUSCULAR | Status: AC
  Administered 2017-07-20: 6500 [IU] via INTRAVENOUS_CENTRAL

## 2017-07-19 MED ORDER — DOXERCALCIFEROL 4 MCG/2ML IV SOLN
1.0000 ug | INTRAVENOUS | Status: DC
  Administered 2017-07-20: 1 ug via INTRAVENOUS
  Filled 2017-07-19: qty 2

## 2017-07-19 MED ORDER — SODIUM CHLORIDE 0.9 % IV SOLN
62.5000 mg | INTRAVENOUS | Status: DC
  Administered 2017-07-20: 62.5 mg via INTRAVENOUS
  Filled 2017-07-19 (×2): qty 5

## 2017-07-19 MED ORDER — PENTAFLUOROPROP-TETRAFLUOROETH EX AERO: 1.0000 "application " | INHALATION_SPRAY | CUTANEOUS | Status: DC | PRN

## 2017-07-19 MED ORDER — ALBUTEROL SULFATE HFA 108 (90 BASE) MCG/ACT IN AERS: 2.0000 | INHALATION_SPRAY | Freq: Four times a day (QID) | RESPIRATORY_TRACT | 0 refills | Status: DC | PRN

## 2017-07-19 MED ORDER — ALBUTEROL SULFATE HFA 108 (90 BASE) MCG/ACT IN AERS
2.0000 | INHALATION_SPRAY | Freq: Four times a day (QID) | RESPIRATORY_TRACT | Status: DC | PRN
  Filled 2017-07-19: qty 6.7

## 2017-07-19 MED ORDER — LIDOCAINE HCL (PF) 1 % IJ SOLN: 5.0000 mL | INTRAMUSCULAR | Status: DC | PRN

## 2017-07-19 MED ORDER — HEPARIN SODIUM (PORCINE) 1000 UNIT/ML DIALYSIS: 1000.0000 [IU] | INTRAMUSCULAR | Status: DC | PRN

## 2017-07-19 MED ORDER — ALBUTEROL SULFATE (2.5 MG/3ML) 0.083% IN NEBU: 2.5000 mg | INHALATION_SOLUTION | Freq: Once | RESPIRATORY_TRACT | Status: DC

## 2017-07-19 NOTE — Discharge Instructions (Signed)
You were admitted for shortness of breath and not being able to maintain your oxygen level due to the flu. You were placed on oxygen and improved and able to maintain oxygen saturations prior to discharge. You are being discharged with Tamiflu, an anti-viral that helps treat the flu. Follow up with your primary doctor on 07/24/17 at 9:00am.

## 2017-07-19 NOTE — Discharge Summary (Signed)
[facility] Discharge Summary  Patient name: Stephen Lane Medical record number: [id number] Date of birth: 06-19-1960 Age: 57 y.o. Gender: male Date of Admission: 07/17/2017  Date of Discharge: 07/20/2017 Admitting Physician: Kinnie Feil, MD  Primary Care Provider: Sela Hua, MD Consultants: None  Indication for Hospitalization: Hypoxia 2/2 flu  Discharge Diagnoses/Problem List:  Hypoxia 2/2 Influenza A Hypertension, stable Type 2 Diabetes, stable CAD ESRD on TTS HD HFpEF Hyperlipidemia  Disposition: Home  Discharge Condition: Stable  Discharge Exam:  General: pleasant male, lying in bed, slightly clammy and acutely ill appearing Cardiovascular: RRR, no murmurs/rubs/gallops Respiratory: rhonchorous sounds in bases, improved from yesterday. Comfortable WOB on RA, sats mid 90s. Abdomen: soft, NTND, hypoactive BS Extremities: no LE edema, no rashes or lesions  Brief Hospital Course:  Stephen Lane a 57 y.o.malewith PMH significant for HTN, T2DM, CAD, HLD, ESRD on HD TTS, HFpEF who presented with shortness of breath and hypoxia, found to be influenza A positive. No pneumonia evidenced on CXR. On admission he required 2L O2 to maintain saturations but was able to be weaned prior to discharge. He was started on renally dosed Tamiflu which he will continue outpatient. During admission, he found albuterol inhaler helpful for shortness of breath and will be provided with one on discharge. Blood glucose managed on sensitive sliding scale insulin. He received HD while admitted and is stable for discharge with close follow up and regular schedule of HD.   Issues for Follow Up:  1. Medication Changes: 1. Started renally dosed Tamiflu, receives $RemoveBeforeDEI'30mg'RpsqIwNvVJsKaAQo$  dose after HD x5 days. 2. Start PRN albuterol inhaler 2. Resumed home glipizide. Consider starting Januvia as an outpatient due to mortality benefit. A1c 7.9% this  admission.  Significant Procedures: none  Significant Labs and Imaging:  Recent Labs  Lab 07/18/17 0422 07/19/17 0454 07/20/17 0030  WBC 9.4 10.7* 8.3  HGB 11.2* 10.6* 10.6*  HCT 32.9* 31.9* 30.2*  PLT 151 139* 150   Recent Labs  Lab 07/17/17 1651 07/18/17 0422 07/19/17 0454 07/20/17 0030  NA 133* 134* 131* 128*  K 3.9 3.9 4.6 4.7  CL 92* 93* 88* 87*  CO2 $Re'24 23 23 'JgZ$ 20*  GLUCOSE 351* 220* 154* 144*  BUN 40* 48* 66* 82*  CREATININE 8.02* 9.08* 11.70* 13.01*  CALCIUM 8.6* 8.0* 7.9* 7.8*  PHOS  --   --   --  7.6*  ALBUMIN  --   --   --  3.0*   CXR 2/12 FINDINGS: Low lung volumes with bibasilar atelectasis. Heart size is accentuated by the low volumes, borderline. Mild vascular congestion. No acute bony abnormality. IMPRESSION: Low lung volumes with bibasilar atelectasis and vascular congestion.  Results/Tests Pending at Time of Discharge: None  Discharge Medications:  Allergies as of 07/20/2017   No Known Allergies     Medication List    TAKE these medications   albuterol 108 (90 Base) MCG/ACT inhaler Commonly known as:  PROVENTIL HFA;VENTOLIN HFA Inhale 2 puffs into the lungs every 6 (six) hours as needed for wheezing or shortness of breath.   amLODipine 5 MG tablet Commonly known as:  NORVASC Take 1 tablet (5 mg total) by mouth daily. ATTN: CHRIS What changed:  how much to take   aspirin EC 81 MG tablet Take 1 tablet (81 mg total) by mouth daily.   atorvastatin 40 MG tablet Commonly known as:  LIPITOR Take 1 tablet (40 mg total) by mouth daily at 6 PM.   benzonatate 200  MG capsule Commonly known as:  TESSALON Take 1 capsule (200 mg total) by mouth 3 (three) times daily as needed for up to 10 days for cough.   calcitRIOL 0.25 MCG capsule Commonly known as:  ROCALTROL Take 1 capsule (0.25 mcg total) by mouth daily.   calcium acetate 667 MG capsule Commonly known as:  PHOSLO Take 3 capsules (2,001 mg total) by mouth 3 (three) times daily with  meals.   glipiZIDE 5 MG 24 hr tablet Commonly known as:  GLUCOTROL XL Take 1 tablet (5 mg total) daily with breakfast by mouth.   oseltamivir 30 MG capsule Commonly known as:  TAMIFLU Take 1 capsule (30 mg total) by mouth every other day for 4 days.   VELPHORO 500 MG chewable tablet Generic drug:  sucroferric oxyhydroxide Chew 500 mg by mouth 3 (three) times daily.       Discharge Instructions: Please refer to Patient Instructions section of EMR for full details.  Patient was counseled important signs and symptoms that should prompt return to medical care, changes in medications, dietary instructions, activity restrictions, and follow up appointments.   Follow-Up Appointments: Follow-up [facility] Follow up on 07/24/2017.   Why:  @ 9:00am Contact information: [place]          Rory Percy, DO 07/20/2017, 4:44 PM PGY-1, [place]

## 2017-07-19 NOTE — Progress Notes (Signed)
Dialysis has been contacted several times to find out when pt would be having his treatment. They are backed up with ICU patients. Explained to pt and family. They were upset because of the delay and left to go home. RN will call them when he has completed dialysis.

## 2017-07-19 NOTE — Progress Notes (Signed)
Family upset re:  Having to wait for HD.  Several calls made to dialysis unit.  Several emergencies have taken priority, causing an extensive waiting period.  Family wants to "take dad to another place tonight for dialysis".  Family encouraged to please be patient and that we are doing the best we can for the patient, continuing to be 'patient focused' in our care.  Patient is to be discharged after HD.  Patient appeared to relax after receiving nebulizer treatment for feeling of "tightness" when breathing.  Appeared more relaxed.  Will continue to keep family updated on progress of HD appointment.

## 2017-07-19 NOTE — Progress Notes (Signed)
Family Medicine Teaching Service Daily Progress Note Intern Pager: [phone]  Patient name: Stephen Lane Medical record number: [id number] Date of birth: 12-31-1960 Age: 57 y.o. Gender: male  Primary Care Provider: Mayo, Pete Pelt, MD Consultants: Nephrology Code Status: Full  Pt Overview and Major Events to Date:  2/13 - admitted for hypoxia d/t flu  Assessment and Plan: Stephen Lane is a 57 y.o. male presenting with influenza. PMH is significant for HTN, T2DM, CAD, HLD, ESRD on HD TTS, HFpEF.  Hypoxia secondary to influenza A: Patient with fevers, chills, body aches, and URI symptoms. Tested positive for influenza A in the ED. Initially with desaturations to 87% on RA and required 2L O2 by [place]. Able to wean to room air yesterday afternoon and able to maintain saturations with ambulation, sats mid 90s on RA overnight. Lung exam with rhonchorous sounds throughout but no signs of pneumonia on CXR at admission. Subjectively feeling better. Continues on renally dosed Tamiflu. - Continue Tamiflu $RemoveBeforeDE'30mg'PsAsfqLhnOlPFoW$  now, then $RemoveB'30mg'aNIUQrjc$  after every HD session for 5 days, per up-to-date recommendations for ESRD patients - Albuterol nebulizer q6hrs prn for shortness of breath/wheezing - Tylenol $RemoveB'650mg'UfdHkGOz$  q6hrs prn for fevers/muscle aches - Tessalon $RemoveBe'200mg'sIVWLNNcy$  tid prn for cough - Chloraseptic spray for sore throat - spot check spO2  HTN: Chronic, stable. Normotensive.  - Continue home Norvasc $RemoveBefo'10mg'QCykpzSIesh$  daily  T2DM: On Glipizide $RemoveBefo'5mg'MtXXoPdfLkv$  daily. A1c this admission 7.9. 7u novolog received last 24 hours. - CBGs tid with meals and at bedtime - Sensitive SSI - Consider starting Januvia as an outpatient  CAD: Stable. No chest pain. - Continue home Aspirin $RemoveBefo'81mg'XxeArwsYWun$  daily and Lipitor $RemoveBef'40mg'YHEESZkjKY$  daily.  ESRD on HD: Receives HD TTS. Had to stop last session 30 minute early because he felt too bad although at same weight after session, likely in the setting of poor PO intake due to flu. - Nephro consulted, plan for dialysis today  prior to discharge - Continue home Calcitriol 0.44mcg daily and Velphoro $RemoveBefo'500mg'uqXmBTzNkat$  tid  HFpEF: Last ECHO 01/29/2015 with hypokinesis of the distal lateral wall with EF 50-55%, G2DD, PA peak pressure 45. CXR showing mild vascular congestion. No signs of fluid overload on exam. - Continue to monitor - Extra volume to be removed by HD  HLD: Lipid panel this admission with LDL 110.  - Continue home Lipitor $RemoveBefo'40mg'hkuBbHevILo$  daily  FEN/GI: Heart healthy carb-modified diet Prophylaxis: Heparin sq  Disposition: likely d/c today after HD  Subjective:  Patient states he feels slightly better today. Did have one episode of post-tussive vomiting overnight. Denies worsening SOB or phlegm production.  Objective: Temp:  [98.8 F (37.1 C)-100.7 F (38.2 C)] 98.8 F (37.1 C) (02/14 0443) Pulse Rate:  [78-102] 87 (02/14 0443) Resp:  [16-18] 16 (02/14 0443) BP: (121-166)/(60-90) 121/60 (02/14 0443) SpO2:  [85 %-99 %] 95 % (02/14 0443) Physical Exam: General: pleasant male, sitting on couch, slightly clammy and acutely ill appearing Cardiovascular: RRR, no murmurs/rubs/gallops Respiratory: rhonchorous sounds throughout. Comfortable WOB on RA, sats mid 90s. Abdomen: soft, NTND, hypoactive BS Extremities: no LE edema, no rashes or lesions  Laboratory: Recent Labs  Lab 07/17/17 1651 07/18/17 0422 07/19/17 0454  WBC 12.2* 9.4 10.7*  HGB 12.3* 11.2* 10.6*  HCT 36.0* 32.9* 31.9*  PLT 169 151 139*   Recent Labs  Lab 07/17/17 1651 07/18/17 0422 07/19/17 0454  NA 133* 134* 131*  K 3.9 3.9 4.6  CL 92* 93* 88*  CO2 $Re'24 23 23  'Lhu$ BUN 40* 48* 66*  CREATININE 8.02* 9.08* 11.70*  CALCIUM 8.6* 8.0* 7.9*  GLUCOSE 351* 220* 154*   Imaging/Diagnostic Tests: Dg Chest 2 View  Result Date: 07/17/2017 CLINICAL DATA:  Cough, weakness, dizziness EXAM: CHEST  2 VIEW COMPARISON:  01/29/2015 FINDINGS: Low lung volumes with bibasilar atelectasis. Heart size is accentuated by the low volumes, borderline. Mild vascular  congestion. No acute bony abnormality. IMPRESSION: Low lung volumes with bibasilar atelectasis and vascular congestion. Electronically Signed   By: Rolm Baptise M.D.   On: 07/17/2017 18:03   Rory Percy, DO 07/19/2017, 7:06 AM PGY-1, [place] Intern pager: [phone], text pages welcome

## 2017-07-19 NOTE — Consult Note (Signed)
[facility] Renal Consultation Note    Indication for Consultation:  Management of ESRD/hemodialysis; anemia, hypertension/volume and secondary hyperparathyroidism PCP: Dr. Pete Pelt Mayo  HPI: Stephen Lane is a 57 y.o. male with ESRD on hemodialysis T,Th,S at [facility]. PMH significant for DM, HTN, carotid stenosis, CAD, grade 2 diastolic dysfunction with EF 50-55%, PNA, HLD. Last HD 07/17/2017 ran 2 hrs 34 minutes of 4 hour tx. Left 0.9 above EDW. Usually compliant with HD prescription.    He presented to ED 07/18/2017 with C/Os of fever, chills, body aches and URI sx. Viral respiratory test positive for influenza A. O2 sats were 87% on RA. CXR showed Low lung volumes with bibasilar atelectasis and vascular congestion. He was started on O2 per primary and has been admitted with influenza A on droplet precautions, appropriately dosed oseltamivir. Will have HD today on schedule.    Patient seen in room, says he is feeling much better. Says symptoms began Sunday. Still feeling achy and tired but denies SOB. Says chest is sore from coughing. Denies N,V,D, abdominal pain, flank pain, dark or bloody stools, changes in hearing or vision, headaches, mechanical falls.    Past Medical History:  Diagnosis Date  . Carotid stenosis    a. Carotid US (05/2013):  1-39% bilateral ICA; brachial waveforms triphasic bilat; vertebrals with antegrade flow bilat; repeat 1 year  . Chronic high back pain    "where the lungs are located" (01/26/2015)  . Coronary atherosclerosis    a. admx with [place] 11/13 => LHC 04/15/12: pLAD 50%, oD1 75% (small), pCFX 40% followed by mCFX 95%, pRCA 60%, mRCA 90%, EF 55-65%. PCI 04/15/12: DES to the [facility] and DES to the [facility].  . Daily headache   . ESRD (end stage renal disease) on dialysis ([place])    "TTS; [facility]; [address] (07/18/2017)  . Essential hypertension   . Mixed hyperlipidemia   . Pneumonia 01/2015  . Type II diabetes  mellitus ([place])    Past Surgical History:  Procedure Laterality Date  . AV FISTULA PLACEMENT Left 01/28/2015   Procedure: LEFT BRACHIAL-CEPHALIC ARTERIOVENOUS (AV) FISTULA CREATION;  Surgeon: Mal Misty, MD;  Location: [place];  Service: Vascular;  Laterality: Left;  . CATARACT EXTRACTION W/ INTRAOCULAR LENS IMPLANT Left   . CORONARY ANGIOPLASTY WITH STENT PLACEMENT  04/15/2012  . EYE SURGERY Right 2007   "blood behind eyeball"  . INSERTION OF DIALYSIS CATHETER N/A 01/28/2015   Procedure: ULTRASOUND GUIDED INSERTION OF HEMODIALYSIS CATHETER RIGHT IJ;  Surgeon: Mal Misty, MD;  Location: [place];  Service: Vascular;  Laterality: N/A;  . LEFT HEART CATHETERIZATION WITH CORONARY ANGIOGRAM N/A 04/15/2012   Procedure: LEFT HEART CATHETERIZATION WITH CORONARY ANGIOGRAM;  Surgeon: Larey Dresser, MD;  Location: [facility] CATH LAB;  Service: Cardiovascular;  Laterality: N/A;  . LIGATION OF COMPETING BRANCHES OF ARTERIOVENOUS FISTULA Left 03/10/2015   Procedure: LIGATION OF COMPETING BRANCHES OF LEFT ARM BRACHIOCEPHALIC ARTERIOVENOUS FISTULA;  Surgeon: Mal Misty, MD;  Location: [place];  Service: Vascular;  Laterality: Left;  . PERCUTANEOUS CORONARY STENT INTERVENTION (PCI-S) N/A 04/15/2012   Procedure: PERCUTANEOUS CORONARY STENT INTERVENTION (PCI-S);  Surgeon: Sherren Mocha, MD;  Location: [facility] CATH LAB;  Service: Cardiovascular;  Laterality: N/A;   Family History  Problem Relation Age of Onset  . Hypertension Mother        died 50 years old unknown reasons  . Other Father        died when Dshawn was 8 years  old from "infection"  . Diabetes Brother   . Hypertension Sister    Social History:  reports that he has quit smoking. His smoking use included cigarettes. He has a 2.50 pack-year smoking history. he has never used smokeless tobacco. He reports that he drinks alcohol. He reports that he does not use drugs. No Known Allergies Prior to Admission medications   Medication Sig Start Date End  Date Taking? Authorizing Provider  amLODipine (NORVASC) 5 MG tablet Take 2 tablets (10 mg total) daily by mouth. ATTN: CHRIS 04/20/17  Yes Mayo, Pete Pelt, MD  aspirin EC 81 MG tablet Take 1 tablet (81 mg total) by mouth daily. 09/27/15  Yes Olam Idler, MD  atorvastatin (LIPITOR) 40 MG tablet Take 1 tablet (40 mg total) by mouth daily at 6 PM. 09/27/15  Yes Olam Idler, MD  calcitRIOL (ROCALTROL) 0.25 MCG capsule Take 1 capsule (0.25 mcg total) by mouth daily. 02/04/15  Yes Haney, Alyssa A, MD  glipiZIDE (GLUCOTROL XL) 5 MG 24 hr tablet Take 1 tablet (5 mg total) daily with breakfast by mouth. 04/19/17  Yes Mayo, Pete Pelt, MD  VELPHORO 500 MG chewable tablet Chew 500 mg by mouth 3 (three) times daily. 06/28/17  Yes [provider]  benzonatate (TESSALON) 200 MG capsule Take 1 capsule (200 mg total) by mouth 3 (three) times daily as needed for up to 10 days for cough. 07/18/17 07/28/17  Rory Percy, DO  calcium acetate (PHOSLO) 667 MG capsule Take 3 capsules (2,001 mg total) by mouth 3 (three) times daily with meals. Patient not taking: Reported on 07/18/2017 02/04/15   Veatrice Bourbon, MD  oseltamivir (TAMIFLU) 30 MG capsule Take 1 capsule (30 mg total) by mouth every other day for 4 days. 07/19/17 07/23/17  Rory Percy, DO   Current Facility-Administered Medications  Medication Dose Route Frequency Provider Last Rate Last Dose  . acetaminophen (TYLENOL) tablet 650 mg  650 mg Oral Q6H PRN Sela Hua, MD   650 mg at 07/19/17 1950   Or  . acetaminophen (TYLENOL) suppository 650 mg  650 mg Rectal Q6H PRN Mayo, Pete Pelt, MD      . albuterol (PROVENTIL HFA;VENTOLIN HFA) 108 (90 Base) MCG/ACT inhaler 2 puff  2 puff Inhalation Q6H PRN Alveda Reasons, MD      . albuterol (PROVENTIL) (2.5 MG/3ML) 0.083% nebulizer solution 2.5 mg  2.5 mg Nebulization Q6H PRN Mayo, Pete Pelt, MD   2.5 mg at 07/18/17 9326  . albuterol (PROVENTIL) (2.5 MG/3ML) 0.083% nebulizer solution 2.5 mg  2.5 mg  Nebulization Once Alveda Reasons, MD      . amLODipine (NORVASC) tablet 5 mg  5 mg Oral Daily Valentina Gu, NP      . aspirin EC tablet 81 mg  81 mg Oral Daily Mayo, Pete Pelt, MD   81 mg at 07/18/17 0945  . atorvastatin (LIPITOR) tablet 40 mg  40 mg Oral q1800 Mayo, Pete Pelt, MD   40 mg at 07/18/17 1731  . benzonatate (TESSALON) capsule 200 mg  200 mg Oral TID PRN Mayo, Pete Pelt, MD      . calcium acetate (PHOSLO) capsule 2,001 mg  2,001 mg Oral TID WC Valentina Gu, NP      . doxercalciferol (HECTOROL) injection 1 mcg  1 mcg Intravenous Q T,Th,Sa-HD Alveda Reasons, MD      . ferric gluconate (NULECIT) 62.5 mg in sodium chloride 0.9 % 100 mL IVPB  62.5 mg  Intravenous Q Thu-HD Valentina Gu, NP      . heparin injection 5,000 Units  5,000 Units Subcutaneous Q8H Mayo, Pete Pelt, MD   5,000 Units at 07/19/17 0645  . insulin aspart (novoLOG) injection 0-5 Units  0-5 Units Subcutaneous QHS Mayo, Pete Pelt, MD      . insulin aspart (novoLOG) injection 0-9 Units  0-9 Units Subcutaneous TID WC Mayo, Pete Pelt, MD   1 Units at 07/19/17 0820  . oseltamivir (TAMIFLU) capsule 30 mg  30 mg Oral QODAY Mayo, Pete Pelt, MD      . phenol (CHLORASEPTIC) mouth spray 1 spray  1 spray Mouth/Throat PRN Mayo, Pete Pelt, MD      . polyethylene glycol (MIRALAX / GLYCOLAX) packet 17 g  17 g Oral Daily PRN Mayo, Pete Pelt, MD      . sucroferric oxyhydroxide [facility]) chewable tablet 500 mg  500 mg Oral TID WC Valentina Gu, NP       Labs: Basic Metabolic Panel: Recent Labs  Lab 07/17/17 1651 07/18/17 0422 07/19/17 0454  NA 133* 134* 131*  K 3.9 3.9 4.6  CL 92* 93* 88*  CO2 $Re'24 23 23  'peQ$ GLUCOSE 351* 220* 154*  BUN 40* 48* 66*  CREATININE 8.02* 9.08* 11.70*  CALCIUM 8.6* 8.0* 7.9*   Liver Function Tests: No results for input(s): AST, ALT, ALKPHOS, BILITOT, PROT, ALBUMIN in the last 168 hours. No results for input(s): LIPASE, AMYLASE in the last 168 hours. No results for  input(s): AMMONIA in the last 168 hours. CBC: Recent Labs  Lab 07/17/17 1651 07/18/17 0422 07/19/17 0454  WBC 12.2* 9.4 10.7*  NEUTROABS 10.9*  --   --   HGB 12.3* 11.2* 10.6*  HCT 36.0* 32.9* 31.9*  MCV 95.7 96.2 95.2  PLT 169 151 139*   Cardiac Enzymes: No results for input(s): CKTOTAL, CKMB, CKMBINDEX, TROPONINI in the last 168 hours. CBG: Recent Labs  Lab 07/18/17 0804 07/18/17 1147 07/18/17 1640 07/18/17 2133 07/19/17 0628  GLUCAP 163* 211* 132* 161* 140*   Iron Studies: No results for input(s): IRON, TIBC, TRANSFERRIN, FERRITIN in the last 72 hours. Studies/Results: Dg Chest 2 View  Result Date: 07/17/2017 CLINICAL DATA:  Cough, weakness, dizziness EXAM: CHEST  2 VIEW COMPARISON:  01/29/2015 FINDINGS: Low lung volumes with bibasilar atelectasis. Heart size is accentuated by the low volumes, borderline. Mild vascular congestion. No acute bony abnormality. IMPRESSION: Low lung volumes with bibasilar atelectasis and vascular congestion. Electronically Signed   By: Rolm Baptise M.D.   On: 07/17/2017 18:03    ROS: As per HPI otherwise negative.   Physical Exam: Vitals:   07/18/17 1447 07/18/17 1746 07/18/17 1929 07/19/17 0443  BP:   128/71 121/60  Pulse:   83 87  Resp:   18 16  Temp: (!) 100.7 F (38.2 C) 100 F (37.8 C) 99 F (37.2 C) 98.8 F (37.1 C)  TempSrc: Oral Axillary Oral Oral  SpO2:  96% 96% 95%  Weight:      Height:         General: Well developed, well nourished, in no acute distress. Head: Normocephalic, atraumatic, sclera non-icteric, mucus membranes are moist Neck: Supple. JVD not elevated. Lungs: Bilateral breath sounds sl decreased in bases with few bibasilar crackles, few scattered coarse breath sounds upper airway  Breathing is unlabored. Heart: RRR with S1 S2. No murmurs, rubs, or gallops appreciated. Abdomen: Soft, non-tender, non-distended with normoactive bowel sounds. No rebound/guarding. No obvious abdominal masses. M-S:  Strength  and tone appear normal for age. Lower extremities:without edema or ischemic changes, no open wounds  Neuro: Alert and oriented X 3. Moves all extremities spontaneously. Psych:  Responds to questions appropriately with a normal affect. Dialysis Access: LUA AVF + bruit  Dialysis Orders: [place] T,Th,S 4 hrs 180NRe 400/Auto 1.5  69 kg 2.0 K/ 2.25 Ca  -Heparin 6500 units IV TIW -Hectorol 1 mcg IV TIW -Venofer 50 mg IV q weekly   Assessment/Plan: 1.  Hypoxia 2/2 Influenza A: per primary. On appropriately dose oseltamivir.  2.  ESRD -  T,Th,S HD today on schedule. K+ 4.6 2.0 K bath Usual heparin.  3.  Hypertension/volume  - BP controlled, antihypertensive meds resumed, doses adjusted per OP med list. Currently at OP EDW. Check standing wt. 0.5-1 liter in HD.  4.  Anemia  - HGB 10.6 No OP ESA .  5.  Metabolic bone disease -  Continue binders, VDRA.  6.  Nutrition - Renal diet, renal vit, nepro 7.  DM-per primary 8. HFpEF: left slightly above EDW last HD tx but now at EDW. Monitor volume closely. Few bibasilar crackles in lungs but no overt evidence of volume overload.   Dovber Ernest H. Owens Shark, NP-C 07/19/2017, 11:08 AM  [employer] [phone]

## 2017-07-20 DIAGNOSIS — Z992 Dependence on renal dialysis: Secondary | ICD-10-CM

## 2017-07-20 DIAGNOSIS — N186 End stage renal disease: Secondary | ICD-10-CM

## 2017-07-20 LAB — GLUCOSE, CAPILLARY: GLUCOSE-CAPILLARY: 183 mg/dL — AB (ref 65–99)

## 2017-07-20 LAB — RENAL FUNCTION PANEL
Albumin: 3 g/dL — ABNORMAL LOW (ref 3.5–5.0)
Anion gap: 21 — ABNORMAL HIGH (ref 5–15)
BUN: 82 mg/dL — ABNORMAL HIGH (ref 6–20)
CO2: 20 mmol/L — ABNORMAL LOW (ref 22–32)
Calcium: 7.8 mg/dL — ABNORMAL LOW (ref 8.9–10.3)
Chloride: 87 mmol/L — ABNORMAL LOW (ref 101–111)
Creatinine, Ser: 13.01 mg/dL — ABNORMAL HIGH (ref 0.61–1.24)
GFR calc Af Amer: 4 mL/min — ABNORMAL LOW (ref >60)
GFR calc non Af Amer: 4 mL/min — ABNORMAL LOW (ref >60)
Glucose, Bld: 144 mg/dL — ABNORMAL HIGH (ref 65–99)
Phosphorus: 7.6 mg/dL — ABNORMAL HIGH (ref 2.5–4.6)
Potassium: 4.7 mmol/L (ref 3.5–5.1)
Sodium: 128 mmol/L — ABNORMAL LOW (ref 135–145)

## 2017-07-20 LAB — CBC
HCT: 30.2 % — ABNORMAL LOW (ref 39.0–52.0)
Hemoglobin: 10.6 g/dL — ABNORMAL LOW (ref 13.0–17.0)
MCH: 33 pg (ref 26.0–34.0)
MCHC: 35.1 g/dL (ref 30.0–36.0)
MCV: 94.1 fL (ref 78.0–100.0)
Platelets: 150 K/uL (ref 150–400)
RBC: 3.21 MIL/uL — ABNORMAL LOW (ref 4.22–5.81)
RDW: 13.8 % (ref 11.5–15.5)
WBC: 8.3 K/uL (ref 4.0–10.5)

## 2017-07-20 MED ORDER — DOXERCALCIFEROL 4 MCG/2ML IV SOLN
INTRAVENOUS | Status: AC
Start: 2017-07-20 — End: 2017-07-20
  Administered 2017-07-20: 1 ug via INTRAVENOUS
  Filled 2017-07-20: qty 2

## 2017-07-20 NOTE — Progress Notes (Addendum)
HD tx completed '@0430'  w/o problem, UF goal met, blood rinsed back, VSS, report called to Minerva Fester, RN

## 2017-07-20 NOTE — Progress Notes (Signed)
HD tx initiated via 15Gx2 w/o problem, pull/push/flush equally w/o problem, VSS, will cont to monitor while on HD tx 

## 2017-07-20 NOTE — Progress Notes (Signed)
Discharge home. Home discharge instruction given, no questions verbalized. 

## 2017-07-23 NOTE — Progress Notes (Deleted)
   [facility] Phone: [phone]   Date of Visit: 07/24/2017   HPI:  Hospital Follow Up:  - patient was admitted from 2/12 -15 for hypoxia due to influenza, CXR was negative. Required 2L O2 initially but weaned off prior to discharge. He was started on Tamiflu.   ROS: See HPI.  [place]:  PMH: HTN HFpEF CAD GERD DM2 with neuropathy  ESRD HLD Normocytic Anemia   PHYSICAL EXAM: There were no vitals taken for this visit. Gen: *** HEENT: *** Heart: *** Lungs: *** Neuro: *** Ext: ***  ASSESSMENT/PLAN:  Health maintenance:  -***  No problem-specific Assessment & Plan notes found for this encounter.  FOLLOW UP: Follow up in *** for ***  Smiley Houseman, MD PGY [place]

## 2017-07-24 ENCOUNTER — Inpatient Hospital Stay: Payer: Medicare Other | Admitting: Internal Medicine

## 2017-07-25 ENCOUNTER — Other Ambulatory Visit: Payer: Self-pay | Admitting: Family Medicine

## 2017-07-25 DIAGNOSIS — I10 Essential (primary) hypertension: Secondary | ICD-10-CM

## 2017-08-05 NOTE — Progress Notes (Deleted)
   [facility] Phone: [phone]   Date of Visit: 08/06/2017   HPI:  Hospital DC Follow up for Hypoxia due to Flu:  - no sings of PNA on CXR. Required 2L O2 initially but was weaned to room air prior to discharge. He was started on Tamiflu.   DM2:  - hemoglobin A1c: 7.9 (07/2017) < 8.9 (04/2017)  ROS: See HPI.  [place]:  PMH: CAD  HTN HFpEF GERD DM2 with neuropathy ESRD Normocytic Anemia HLD   PHYSICAL EXAM: There were no vitals taken for this visit. Gen: *** HEENT: *** Heart: *** Lungs: *** Neuro: *** Ext: ***  ASSESSMENT/PLAN:  Health maintenance:  -***  No problem-specific Assessment & Plan notes found for this encounter.  FOLLOW UP: Follow up in *** for ***  Smiley Houseman, MD PGY [place]

## 2017-08-06 ENCOUNTER — Ambulatory Visit: Payer: Medicare Other | Admitting: Internal Medicine

## 2018-04-15 ENCOUNTER — Other Ambulatory Visit: Payer: Self-pay

## 2018-04-15 DIAGNOSIS — N186 End stage renal disease: Secondary | ICD-10-CM

## 2018-04-15 DIAGNOSIS — Z992 Dependence on renal dialysis: Principal | ICD-10-CM

## 2018-05-24 ENCOUNTER — Ambulatory Visit (INDEPENDENT_AMBULATORY_CARE_PROVIDER_SITE_OTHER): Payer: Medicare Other | Admitting: Vascular Surgery

## 2018-05-24 ENCOUNTER — Encounter: Payer: Self-pay | Admitting: Vascular Surgery

## 2018-05-24 ENCOUNTER — Ambulatory Visit (HOSPITAL_COMMUNITY)
Admission: RE | Admit: 2018-05-24 | Discharge: 2018-05-24 | Disposition: A | Discharge status: Home / Self Care | Payer: Medicare Other | Source: Ambulatory Visit | Attending: Vascular Surgery | Admitting: Vascular Surgery

## 2018-05-24 ENCOUNTER — Other Ambulatory Visit: Payer: Self-pay

## 2018-05-24 ENCOUNTER — Ambulatory Visit (INDEPENDENT_AMBULATORY_CARE_PROVIDER_SITE_OTHER)
Admission: RE | Admit: 2018-05-24 | Discharge: 2018-05-24 | Disposition: A | Discharge status: Home / Self Care | Payer: Medicare Other | Source: Ambulatory Visit | Attending: Family | Admitting: Family

## 2018-05-24 ENCOUNTER — Encounter: Payer: Self-pay | Admitting: *Deleted

## 2018-05-24 ENCOUNTER — Other Ambulatory Visit: Payer: Self-pay | Admitting: *Deleted

## 2018-05-24 VITALS — BP 176/100 | HR 96 | Temp 97.1°F | Resp 20 | Ht 63.0 in | Wt 152.0 lb

## 2018-05-24 DIAGNOSIS — N186 End stage renal disease: Secondary | ICD-10-CM | POA: Diagnosis present

## 2018-05-24 DIAGNOSIS — Z992 Dependence on renal dialysis: Secondary | ICD-10-CM

## 2018-05-24 NOTE — Progress Notes (Signed)
Patient ID: Stephen Lane, male   DOB: 1961-04-07, 57 y.o.   MRN: [id number]  Reason for Consult: Follow-up (Eval for new access)   Referred by Stephen Skiff, MD  Subjective:     HPI:  Stephen Lane is a 57 y.o. male with history of end-stage renal disease previously on dialysis via left arm cephalic vein fistula.  This has recently thrombosed and a catheter was placed.  He now presents for new evaluation of access.  He is never had right upper extremity access nor is he had right axillary, chest, shoulder surgery.  Dialysis access working well Tuesday Thursday Saturday.  Does not take blood thinners.  He is right-hand dominant.  All information obtained via interpreter.  Past Medical History:  Diagnosis Date  . Carotid stenosis    a. Carotid US (05/2013):  1-39% bilateral ICA; brachial waveforms triphasic bilat; vertebrals with antegrade flow bilat; repeat 1 year  . Chronic high back pain    "where the lungs are located" (01/26/2015)  . Coronary atherosclerosis    a. admx with [place] 11/13 => LHC 04/15/12: pLAD 50%, oD1 75% (small), pCFX 40% followed by mCFX 95%, pRCA 60%, mRCA 90%, EF 55-65%. PCI 04/15/12: DES to the [facility] and DES to the [facility].  . Daily headache   . ESRD (end stage renal disease) on dialysis ([place])    "TTS; [facility]; [address] (07/18/2017)  . Essential hypertension   . Mixed hyperlipidemia   . Pneumonia 01/2015  . Type II diabetes mellitus (HCC)    Family History  Problem Relation Age of Onset  . Hypertension Mother        died 33 years old unknown reasons  . Other Father        died when Adonis was 1 years old from "infection"  . Diabetes Brother   . Hypertension Sister    Past Surgical History:  Procedure Laterality Date  . AV FISTULA PLACEMENT Left 01/28/2015   Procedure: LEFT BRACHIAL-CEPHALIC ARTERIOVENOUS (AV) FISTULA CREATION;  Surgeon: Lane Misty, MD;  Location: [place];  Service: Vascular;  Laterality: Left;  . CATARACT  EXTRACTION W/ INTRAOCULAR LENS IMPLANT Left   . CORONARY ANGIOPLASTY WITH STENT PLACEMENT  04/15/2012  . EYE SURGERY Right 2007   "blood behind eyeball"  . INSERTION OF DIALYSIS CATHETER N/A 01/28/2015   Procedure: ULTRASOUND GUIDED INSERTION OF HEMODIALYSIS CATHETER RIGHT IJ;  Surgeon: Lane Misty, MD;  Location: [place];  Service: Vascular;  Laterality: N/A;  . LEFT HEART CATHETERIZATION WITH CORONARY ANGIOGRAM N/A 04/15/2012   Procedure: LEFT HEART CATHETERIZATION WITH CORONARY ANGIOGRAM;  Surgeon: Stephen Dresser, MD;  Location: [facility] CATH LAB;  Service: Cardiovascular;  Laterality: N/A;  . LIGATION OF COMPETING BRANCHES OF ARTERIOVENOUS FISTULA Left 03/10/2015   Procedure: LIGATION OF COMPETING BRANCHES OF LEFT ARM BRACHIOCEPHALIC ARTERIOVENOUS FISTULA;  Surgeon: Lane Misty, MD;  Location: [place];  Service: Vascular;  Laterality: Left;  . PERCUTANEOUS CORONARY STENT INTERVENTION (PCI-S) N/A 04/15/2012   Procedure: PERCUTANEOUS CORONARY STENT INTERVENTION (PCI-S);  Surgeon: Stephen Mocha, MD;  Location: [facility] C[facility] LAB;  Service: Cardiovascular;  Laterality: N/A;    Short Social History:  Social History   Tobacco Use  . Smoking status: Former Smoker    Packs/day: 0.25    Years: 10.00    Pack years: 2.50    Types: Cigarettes  . Smokeless tobacco: Never Used  . Tobacco comment: "quit smoking cigarettes in the 1980's"  Substance Use Topics  . Alcohol use:  Yes    Comment: "quit drinking in ~ 2010"    No Known Allergies  Current Outpatient Medications  Medication Sig Dispense Refill  . albuterol (PROVENTIL HFA;VENTOLIN HFA) 108 (90 Base) MCG/ACT inhaler Inhale 2 puffs into the lungs every 6 (six) hours as needed for wheezing or shortness of breath. 1 Inhaler 0  . amLODipine (NORVASC) 5 MG tablet TAKE ONE TABLET BY MOUTH DAILY 90 tablet 0  . aspirin EC 81 MG tablet Take 1 tablet (81 mg total) by mouth daily. 90 tablet 3  . atorvastatin (LIPITOR) 40 MG tablet Take 1 tablet (40 mg total)  by mouth daily at 6 PM. 90 tablet 2  . calcitRIOL (ROCALTROL) 0.25 MCG capsule Take 1 capsule (0.25 mcg total) by mouth daily. 30 capsule 3  . calcium acetate (PHOSLO) 667 MG capsule Take 3 capsules (2,001 mg total) by mouth 3 (three) times daily with meals. 30 capsule 3  . glipiZIDE (GLUCOTROL XL) 5 MG 24 hr tablet Take 1 tablet (5 mg total) daily with breakfast by mouth. 90 tablet 0  . VELPHORO 500 MG chewable tablet Chew 500 mg by mouth 3 (three) times daily.     No current facility-administered medications for this visit.     Review of Systems  Constitutional:  Constitutional negative. HENT: HENT negative.  Eyes: Eyes negative.  Respiratory: Respiratory negative.  Cardiovascular: Cardiovascular negative.  GI: Gastrointestinal negative.  Skin: Skin negative.  Neurological: Neurological negative. Hematologic: Hematologic/lymphatic negative.  Psychiatric: Psychiatric negative.        Objective:  Objective   Vitals:   05/24/18 1007  BP: (!) 176/100  Pulse: 96  Resp: 20  Temp: (!) 97.1 F (36.2 C)  SpO2: 100%  Weight: 152 lb (68.9 kg)  Height: $Remove'5\' 3"'EQGNaTR$  (1.6 m)   Body mass index is 26.93 kg/m.  Physical Exam HENT:     Head: Normocephalic.  Eyes:     Pupils: Pupils are equal, round, and reactive to light.  Neck:     Musculoskeletal: Normal range of motion and neck supple.  Cardiovascular:     Rate and Rhythm: Normal rate.     Pulses:          Radial pulses are 2+ on the right side and 2+ on the left side.  Pulmonary:     Effort: Pulmonary effort is normal.  Musculoskeletal: Normal range of motion.        General: No deformity.  Skin:    General: Skin is warm and dry.  Neurological:     General: No focal deficit present.     Mental Status: He is alert.  Psychiatric:        Mood and Affect: Mood normal.        Behavior: Behavior normal.        Thought Content: Thought content normal.        Judgment: Judgment normal.     Data: I have independently  interpreted his bilateral upper extremity arterial duplexes which demonstrate brachial artery 0.5 cm with diminutive radial and ulnar arteries bilaterally.  I have also independently interpreted his venous mapping which demonstrates possible suitable vein in the right upper extremity both cephalic and basilic for fistula creation.  Cephalic vein on the left cannot be traced given recent thrombosis.     Assessment/Plan:     57 year old male presents on his birthday today for evaluation of new dialysis access.  He appears to have suitable vein in the right upper extremity.  He does not  take blood thinners.  He dialyzes Tuesday Thursday Saturdays.  We will set him up for a nondialysis day in the near future for right upper extremity AV fistula versus graft which will likely need to be upper arm given both diminutive vein and artery in the forearm.  All questions were answered via interpreter.     Waynetta Sandy MD [facility]

## 2018-06-11 NOTE — Progress Notes (Signed)
Tried numerous times yesterday and today to contact pt for pre-op call. Even called his dialysis center and asked them to tell him to answer his phone. He never did. I left pre-op instructions on his voicemail via [employer] ID # [phone].

## 2018-06-12 ENCOUNTER — Ambulatory Visit (HOSPITAL_COMMUNITY): Payer: Medicare Other | Admitting: Anesthesiology

## 2018-06-12 ENCOUNTER — Encounter (HOSPITAL_COMMUNITY): Payer: Self-pay

## 2018-06-12 ENCOUNTER — Other Ambulatory Visit: Payer: Self-pay

## 2018-06-12 ENCOUNTER — Ambulatory Visit (HOSPITAL_COMMUNITY)
Admission: RE | Admit: 2018-06-12 | Discharge: 2018-06-12 | Disposition: A | Discharge status: Home / Self Care | Payer: Medicare Other | Attending: Vascular Surgery | Admitting: Vascular Surgery

## 2018-06-12 ENCOUNTER — Encounter (HOSPITAL_COMMUNITY)
Admission: RE | Disposition: A | Discharge status: Home / Self Care | Payer: Self-pay | Source: Home / Self Care | Attending: Vascular Surgery

## 2018-06-12 DIAGNOSIS — Z87891 Personal history of nicotine dependence: Secondary | ICD-10-CM | POA: Diagnosis not present

## 2018-06-12 DIAGNOSIS — Z79899 Other long term (current) drug therapy: Secondary | ICD-10-CM | POA: Diagnosis not present

## 2018-06-12 DIAGNOSIS — N186 End stage renal disease: Secondary | ICD-10-CM | POA: Diagnosis not present

## 2018-06-12 DIAGNOSIS — Z992 Dependence on renal dialysis: Secondary | ICD-10-CM | POA: Diagnosis not present

## 2018-06-12 DIAGNOSIS — I120 Hypertensive chronic kidney disease with stage 5 chronic kidney disease or end stage renal disease: Secondary | ICD-10-CM | POA: Diagnosis not present

## 2018-06-12 DIAGNOSIS — Z7984 Long term (current) use of oral hypoglycemic drugs: Secondary | ICD-10-CM | POA: Insufficient documentation

## 2018-06-12 DIAGNOSIS — N185 Chronic kidney disease, stage 5: Secondary | ICD-10-CM | POA: Diagnosis not present

## 2018-06-12 DIAGNOSIS — Z7982 Long term (current) use of aspirin: Secondary | ICD-10-CM | POA: Insufficient documentation

## 2018-06-12 DIAGNOSIS — E1122 Type 2 diabetes mellitus with diabetic chronic kidney disease: Secondary | ICD-10-CM | POA: Insufficient documentation

## 2018-06-12 HISTORY — PX: AV FISTULA PLACEMENT: SHX1204

## 2018-06-12 LAB — GLUCOSE, CAPILLARY
Glucose-Capillary: 200 mg/dL — ABNORMAL HIGH (ref 70–99)
Glucose-Capillary: 213 mg/dL — ABNORMAL HIGH (ref 70–99)

## 2018-06-12 LAB — POCT I-STAT 4, (NA,K, GLUC, HGB,HCT)
Glucose, Bld: 237 mg/dL — ABNORMAL HIGH (ref 70–99)
HCT: 32 % — ABNORMAL LOW (ref 39.0–52.0)
Hemoglobin: 10.9 g/dL — ABNORMAL LOW (ref 13.0–17.0)
Potassium: 4.5 mmol/L (ref 3.5–5.1)
Sodium: 134 mmol/L — ABNORMAL LOW (ref 135–145)

## 2018-06-12 SURGERY — ARTERIOVENOUS (AV) FISTULA CREATION
Anesthesia: Monitor Anesthesia Care | Laterality: Right

## 2018-06-12 MED ORDER — FENTANYL CITRATE (PF) 100 MCG/2ML IJ SOLN: 25.0000 ug | INTRAMUSCULAR | Status: DC | PRN

## 2018-06-12 MED ORDER — ALBUTEROL SULFATE HFA 108 (90 BASE) MCG/ACT IN AERS
INHALATION_SPRAY | RESPIRATORY_TRACT | Status: AC
  Filled 2018-06-12: qty 6.7

## 2018-06-12 MED ORDER — ONDANSETRON HCL 4 MG/2ML IJ SOLN
INTRAMUSCULAR | Status: DC | PRN
  Administered 2018-06-12: 4 mg via INTRAVENOUS

## 2018-06-12 MED ORDER — PROPOFOL 500 MG/50ML IV EMUL
INTRAVENOUS | Status: DC | PRN
  Administered 2018-06-12: 75 ug/kg/min via INTRAVENOUS

## 2018-06-12 MED ORDER — PROPOFOL 10 MG/ML IV BOLUS
INTRAVENOUS | Status: AC
  Filled 2018-06-12: qty 20

## 2018-06-12 MED ORDER — SODIUM CHLORIDE 0.9 % IV SOLN
INTRAVENOUS | Status: DC | PRN
  Administered 2018-06-12: 500 mL

## 2018-06-12 MED ORDER — LIDOCAINE-EPINEPHRINE 1 %-1:100000 IJ SOLN
INTRAMUSCULAR | Status: AC
  Filled 2018-06-12: qty 1

## 2018-06-12 MED ORDER — LIDOCAINE 2% (20 MG/ML) 5 ML SYRINGE
INTRAMUSCULAR | Status: DC | PRN
  Administered 2018-06-12: 60 mg via INTRAVENOUS

## 2018-06-12 MED ORDER — OXYCODONE HCL 5 MG PO TABS: 5.0000 mg | ORAL_TABLET | Freq: Four times a day (QID) | ORAL | 0 refills | Status: DC | PRN

## 2018-06-12 MED ORDER — EPHEDRINE 5 MG/ML INJ
INTRAVENOUS | Status: AC
  Filled 2018-06-12: qty 10

## 2018-06-12 MED ORDER — MIDAZOLAM HCL 5 MG/5ML IJ SOLN
INTRAMUSCULAR | Status: DC | PRN
  Administered 2018-06-12: 2 mg via INTRAVENOUS

## 2018-06-12 MED ORDER — CEFAZOLIN SODIUM-DEXTROSE 2-4 GM/100ML-% IV SOLN
2.0000 g | INTRAVENOUS | Status: AC
  Administered 2018-06-12: 2 g via INTRAVENOUS
  Filled 2018-06-12: qty 100

## 2018-06-12 MED ORDER — FENTANYL CITRATE (PF) 250 MCG/5ML IJ SOLN
INTRAMUSCULAR | Status: DC | PRN
  Administered 2018-06-12: 50 ug via INTRAVENOUS

## 2018-06-12 MED ORDER — LIDOCAINE-EPINEPHRINE 1 %-1:100000 IJ SOLN
INTRAMUSCULAR | Status: DC | PRN
  Administered 2018-06-12: 10 mL

## 2018-06-12 MED ORDER — PROMETHAZINE HCL 25 MG/ML IJ SOLN: 6.2500 mg | INTRAMUSCULAR | Status: DC | PRN

## 2018-06-12 MED ORDER — DEXAMETHASONE SODIUM PHOSPHATE 10 MG/ML IJ SOLN
INTRAMUSCULAR | Status: AC
  Filled 2018-06-12: qty 1

## 2018-06-12 MED ORDER — 0.9 % SODIUM CHLORIDE (POUR BTL) OPTIME
TOPICAL | Status: DC | PRN
  Administered 2018-06-12: 1000 mL

## 2018-06-12 MED ORDER — FENTANYL CITRATE (PF) 250 MCG/5ML IJ SOLN
INTRAMUSCULAR | Status: AC
  Filled 2018-06-12: qty 5

## 2018-06-12 MED ORDER — LIDOCAINE 2% (20 MG/ML) 5 ML SYRINGE
INTRAMUSCULAR | Status: AC
  Filled 2018-06-12: qty 5

## 2018-06-12 MED ORDER — MIDAZOLAM HCL 2 MG/2ML IJ SOLN
INTRAMUSCULAR | Status: AC
  Filled 2018-06-12: qty 2

## 2018-06-12 MED ORDER — ROCURONIUM BROMIDE 50 MG/5ML IV SOSY
PREFILLED_SYRINGE | INTRAVENOUS | Status: AC
  Filled 2018-06-12: qty 5

## 2018-06-12 MED ORDER — SUCCINYLCHOLINE CHLORIDE 200 MG/10ML IV SOSY
PREFILLED_SYRINGE | INTRAVENOUS | Status: AC
  Filled 2018-06-12: qty 10

## 2018-06-12 MED ORDER — SODIUM CHLORIDE 0.9 % IV SOLN
INTRAVENOUS | Status: DC
  Administered 2018-06-12: 08:00:00 via INTRAVENOUS

## 2018-06-12 MED ORDER — SODIUM CHLORIDE 0.9 % IV SOLN
INTRAVENOUS | Status: AC
  Filled 2018-06-12: qty 1.2

## 2018-06-12 MED ORDER — PHENYLEPHRINE 40 MCG/ML (10ML) SYRINGE FOR IV PUSH (FOR BLOOD PRESSURE SUPPORT)
PREFILLED_SYRINGE | INTRAVENOUS | Status: AC
  Filled 2018-06-12: qty 10

## 2018-06-12 MED ORDER — ONDANSETRON HCL 4 MG/2ML IJ SOLN
INTRAMUSCULAR | Status: AC
  Filled 2018-06-12: qty 2

## 2018-06-12 SURGICAL SUPPLY — 29 items
ADH SKN CLS APL DERMABOND .7 (GAUZE/BANDAGES/DRESSINGS) ×1
ARMBAND PINK RESTRICT EXTREMIT (MISCELLANEOUS) ×3 IMPLANT
CANISTER SUCT 3000ML PPV (MISCELLANEOUS) ×3 IMPLANT
CLIP VESOCCLUDE MED 6/CT (CLIP) ×3 IMPLANT
CLIP VESOCCLUDE SM WIDE 6/CT (CLIP) ×3 IMPLANT
COVER PROBE W GEL 5X96 (DRAPES) ×2 IMPLANT
COVER WAND RF STERILE (DRAPES) ×1 IMPLANT
DERMABOND ADVANCED (GAUZE/BANDAGES/DRESSINGS) ×2
DERMABOND ADVANCED .7 DNX12 (GAUZE/BANDAGES/DRESSINGS) ×1 IMPLANT
ELECT REM PT RETURN 9FT ADLT (ELECTROSURGICAL) ×3
ELECTRODE REM PT RTRN 9FT ADLT (ELECTROSURGICAL) ×1 IMPLANT
GLOVE BIO SURGEON STRL SZ7.5 (GLOVE) ×3 IMPLANT
GOWN STRL REUS W/ TWL LRG LVL3 (GOWN DISPOSABLE) ×2 IMPLANT
GOWN STRL REUS W/ TWL XL LVL3 (GOWN DISPOSABLE) ×1 IMPLANT
GOWN STRL REUS W/TWL LRG LVL3 (GOWN DISPOSABLE) ×3
GOWN STRL REUS W/TWL XL LVL3 (GOWN DISPOSABLE) ×6
INSERT FOGARTY SM (MISCELLANEOUS) IMPLANT
KIT BASIN OR (CUSTOM PROCEDURE TRAY) ×3 IMPLANT
KIT TURNOVER KIT B (KITS) ×3 IMPLANT
NS IRRIG 1000ML POUR BTL (IV SOLUTION) ×3 IMPLANT
PACK CV ACCESS (CUSTOM PROCEDURE TRAY) ×3 IMPLANT
PAD ARMBOARD 7.5X6 YLW CONV (MISCELLANEOUS) ×6 IMPLANT
SUT MNCRL AB 4-0 PS2 18 (SUTURE) ×3 IMPLANT
SUT PROLENE 6 0 BV (SUTURE) ×3 IMPLANT
SUT VIC AB 3-0 SH 27 (SUTURE) ×3
SUT VIC AB 3-0 SH 27X BRD (SUTURE) ×1 IMPLANT
TOWEL GREEN STERILE (TOWEL DISPOSABLE) ×3 IMPLANT
UNDERPAD 30X30 (UNDERPADS AND DIAPERS) ×3 IMPLANT
WATER STERILE IRR 1000ML POUR (IV SOLUTION) ×1 IMPLANT

## 2018-06-12 NOTE — Discharge Instructions (Signed)
[facility]  Discharge Instructions  AV Fistula or Graft Surgery for Dialysis Access  Please refer to the following instructions for your post-procedure care. Your surgeon or physician assistant will discuss any changes with you.  Activity  You may drive the day following your surgery, if you are comfortable and no longer taking prescription pain medication. Resume full activity as the soreness in your incision resolves.  Bathing/Showering  You may shower after you go home. Keep your incision dry for 48 hours. Do not soak in a bathtub, hot tub, or swim until the incision heals completely. You may not shower if you have a hemodialysis catheter.  Incision Care  Clean your incision with mild soap and water after 48 hours. Pat the area dry with a clean towel. You do not need a bandage unless otherwise instructed. Do not apply any ointments or creams to your incision. You may have skin glue on your incision. Do not peel it off. It will come off on its own in about one week. Your arm may swell a bit after surgery. To reduce swelling use pillows to elevate your arm so it is above your heart. Your doctor will tell you if you need to lightly wrap your arm with an ACE bandage.  Diet  Resume your normal diet. There are not special food restrictions following this procedure. In order to heal from your surgery, it is CRITICAL to get adequate nutrition. Your body requires vitamins, minerals, and protein. Vegetables are the best source of vitamins and minerals. Vegetables also provide the perfect balance of protein. Processed food has little nutritional value, so try to avoid this.  Medications  Resume taking all of your medications. If your incision is causing pain, you may take over-the counter pain relievers such as acetaminophen (Tylenol). If you were prescribed a stronger pain medication, please be aware these medications can cause nausea and constipation. Prevent  nausea by taking the medication with a snack or meal. Avoid constipation by drinking plenty of fluids and eating foods with high amount of fiber, such as fruits, vegetables, and grains.  Do not take Tylenol if you are taking prescription pain medications.  Follow up Your surgeon may want to see you in the office following your access surgery. If so, this will be arranged at the time of your surgery.  Please call us immediately for any of the following conditions:  Increased pain, redness, drainage (pus) from your incision site Fever of 101 degrees or higher Severe or worsening pain at your incision site Hand pain or numbness.  Reduce your risk of vascular disease:  Stop smoking. If you would like help, call [facility] at 1-800-QUIT-NOW 4100028106) or [place] at [place] your cholesterol Maintain a desired weight Control your diabetes Keep your blood pressure down  Dialysis  It will take several weeks to several months for your new dialysis access to be ready for use. Your surgeon will determine when it is okay to use it. Your nephrologist will continue to direct your dialysis. You can continue to use your Permcath until your new access is ready for use.   06/12/2018 Waylon Levis Nazir [id number] [birth year]  Surgeon(s): Waynetta Sandy, MD  Procedure(s): Creation of right brachiocephalic AV fistula  x Do not stick fistula for 12 weeks    If you have any questions, please call the office at [phone].   Monitored Anesthesia Care, Care After These instructions provide you with information about caring for  yourself after your procedure. Your health care provider may also give you more specific instructions. Your treatment has been planned according to current medical practices, but problems sometimes occur. Call your health care provider if you have any problems or questions after your procedure. What can I expect after the procedure? After  your procedure, you may:  Feel sleepy for several hours.  Feel clumsy and have poor balance for several hours.  Feel forgetful about what happened after the procedure.  Have poor judgment for several hours.  Feel nauseous or vomit.  Have a sore throat if you had a breathing tube during the procedure. Follow these instructions at home: For at least 24 hours after the procedure:      Have a responsible adult stay with you. It is important to have someone help care for you until you are awake and alert.  Rest as needed.  Do not: ? Participate in activities in which you could fall or become injured. ? Drive. ? Use heavy machinery. ? Drink alcohol. ? Take sleeping pills or medicines that cause drowsiness. ? Make important decisions or sign legal documents. ? Take care of children on your own. Eating and drinking  Follow the diet that is recommended by your health care provider.  If you vomit, drink water, juice, or soup when you can drink without vomiting.  Make sure you have little or no nausea before eating solid foods. General instructions  Take over-the-counter and prescription medicines only as told by your health care provider.  If you have sleep apnea, surgery and certain medicines can increase your risk for breathing problems. Follow instructions from your health care provider about wearing your sleep device: ? Anytime you are sleeping, including during daytime naps. ? While taking prescription pain medicines, sleeping medicines, or medicines that make you drowsy.  If you smoke, do not smoke without supervision.  Keep all follow-up visits as told by your health care provider. This is important. Contact a health care provider if:  You keep feeling nauseous or you keep vomiting.  You feel light-headed.  You develop a rash.  You have a fever. Get help right away if:  You have trouble breathing. Summary  For several hours after your procedure, you may  feel sleepy and have poor judgment.  Have a responsible adult stay with you for at least 24 hours or until you are awake and alert. This information is not intended to replace advice given to you by your health care provider. Make sure you discuss any questions you have with your health care provider. Document Released: 09/12/2015 Document Revised: 01/05/2017 Document Reviewed: 09/12/2015 Elsevier Interactive Patient Education  2019 [place], cuidados posteriores Monitored Anesthesia Care, Care After Estas indicaciones le proporcionan informacin acerca de cmo deber cuidarse despus de su procedimiento. El mdico tambin podr darle instrucciones ms especficas. Su tratamiento ha sido planificado segn las prcticas mdicas actuales, Armed forces training and education officer en algunos casos pueden ocurrir problemas. Comunquese con el mdico si tiene algn problema o preguntas despus del procedimiento. Qu puedo esperar despus del procedimiento? Despus del procedimiento, puede:  Sentirse somnoliento durante varias horas.  Sentirse torpe y [employer] de equilibrio durante varias horas.  Olvidarse de lo que sucedi despus del procedimiento.  Perder el sentido de la realidad durante varias horas.  Sentirse nauseoso o vomitar.  Tener dolor de garganta si le colocaron un tubo respiratorio durante el procedimiento. Siga estas indicaciones en su casa: Durante al menos 24horas despus del procedimiento:  Pdale a un adulto responsable que permanezca con usted. Es importante que alguien cuide de usted hasta que se despierte y Cabin crew.  Descanse todo lo que sea necesario.  No haga lo siguiente: ? Participar en actividades en las que podra caerse o lastimarse. ? Conducir. ? Operar maquinarias pesadas. ? Beber alcohol. ? Tomar somnferos o medicamentos que causen somnolencia. ? Firmar documentos legales ni tomar [employer]. ? Cuidar a nios por su  cuenta. Qu debe comer y beber  Siga la dieta recomendada por el mdico.  Si vomita, tome agua, jugo o sopa cuando pueda beber sin vomitar.  Asegrese de no tener nuseas antes de ingerir alimentos slidos. Instrucciones generales  [employer] de venta libre y los recetados solamente como se lo haya indicado el mdico.  Si tiene apnea del sueo, la [place] y ciertos medicamentos pueden aumentar el riesgo de problemas respiratorios. Siga las indicaciones del mdico respecto al uso de su dispositivo para dormir: ? Siempre que duerma, incluso durante las siestas que tome en el da. ? Mientras tome analgsicos recetados, medicamentos para dormir o medicamentos que producen somnolencia.  Si fuma, no lo haga sin supervisin.  Concurra a todas las visitas de seguimiento como se lo haya indicado el mdico. Esto es importante. Comunquese con un mdico si:  Sigue teniendo nuseas o vomitando.  Siente que va a desvanecerse.  Presenta una erupcin cutnea.  Tiene fiebre. Solicite ayuda de inmediato si:  Tiene dificultad para respirar. Resumen  Tal vez se sienta adormecido y pierda el sentido de la realidad durante varias horas despus del procedimiento.  Pdale a un adulto responsable que permanezca con usted al menos por 24 horas o hasta que est despierto y consciente. Esta informacin no tiene Marine scientist el consejo del mdico. Asegrese de hacerle al mdico cualquier pregunta que tenga. Document Released: 01/27/2016 Document Revised: 03/21/2017 Document Reviewed: 09/12/2015 Elsevier Interactive Patient Education  2019 [place] Anesthesia Home Care Instructions  Activity: Get plenty of rest for the remainder of the day. A responsible individual must stay with you for 24 hours following the procedure.  For the next 24 hours, DO NOT: -Drive a car -Paediatric nurse -Drink alcoholic beverages -Take any medication unless instructed by your  physician -Make any legal decisions or sign important papers.  Meals: Start with liquid foods such as gelatin or soup. Progress to regular foods as tolerated. Avoid greasy, spicy, heavy foods. If nausea and/or vomiting occur, drink only clear liquids until the nausea and/or vomiting subsides. Call your physician if vomiting continues.  Special Instructions/Symptoms: Your throat may feel dry or sore from the anesthesia or the breathing tube placed in your throat during surgery. If this causes discomfort, gargle with warm salt water. The discomfort should disappear within 24 hours.  If you had a scopolamine patch placed behind your ear for the management of post- operative nausea and/or vomiting:  1. The medication in the patch is effective for 72 hours, after which it should be removed.  Wrap patch in a tissue and discard in the trash. Wash hands thoroughly with soap and water. 2. You may remove the patch earlier than 72 hours if you experience unpleasant side effects which may include dry mouth, dizziness or visual disturbances. 3. Avoid touching the patch. Wash your hands with soap and water after contact with the patch.

## 2018-06-12 NOTE — H&P (Signed)
   History and Physical Update  The patient was interviewed and re-examined.  The patient's previous History and Physical has been reviewed and is unchanged from recent office visit. Plan for right arm avf vs avg.   Cyndia Degraff C. Donzetta Matters, MD [facility] [place] Office: [phone] Pager: [phone]  06/12/2018, 8:23 AM

## 2018-06-12 NOTE — Anesthesia Procedure Notes (Signed)
Date/Time: 06/12/2018 8:30 AM Performed by: Myna Bright, CRNA Pre-anesthesia Checklist: Patient identified, Emergency Drugs available, Suction available and Patient being monitored Oxygen Delivery Method: Nasal cannula Placement Confirmation: positive ETCO2 and breath sounds checked- equal and bilateral

## 2018-06-12 NOTE — Anesthesia Postprocedure Evaluation (Signed)
Anesthesia Post Note  Patient: Stephen Lane  Procedure(s) Performed: ARTERIOVENOUS (AV) FISTULA CREATION (Right )     Patient location during evaluation: PACU Anesthesia Type: MAC Level of consciousness: awake and alert Pain management: pain level controlled Vital Signs Assessment: post-procedure vital signs reviewed and stable Respiratory status: spontaneous breathing, nonlabored ventilation, respiratory function stable and patient connected to nasal cannula oxygen Cardiovascular status: stable and blood pressure returned to baseline Postop Assessment: no apparent nausea or vomiting Anesthetic complications: no    Last Vitals:  Vitals:   06/12/18 0938 06/12/18 0953  BP: 107/73 111/79  Pulse: 68 69  Resp: 11 18  Temp:    SpO2: 95% 98%    Last Pain:  Vitals:   06/12/18 0953  PainSc: 2                  [name] S

## 2018-06-12 NOTE — Op Note (Signed)
    Patient name: Stephen Lane MRN: [id number] DOB: 1960-12-14 Sex: male  06/12/2018 Pre-operative Diagnosis: End-stage renal disease Post-operative diagnosis:  Same Surgeon:  Erlene Quan C. Donzetta Matters, MD Assistant: Leontine Locket, PA Procedure Performed:  Right arm brachiocephalic AV fistula creation  Indications: 58 year old male with history of left arm AV access that has failed.  He has what appears to be suitable vein in his upper arm on the right for fistula creation.  He is right-hand dominant.  He currently dialyzes via right IJ tunneled catheter.  Findings: Right upper extremity there were suitable cephalic and basilic vein above the antecubitum.  The brachial artery was 5 mm diameter free of disease.  Completion was a strong thrill in the runoff vein as well as a palpable radial pulse at the wrist.   Procedure:  The patient was identified in the holding area and taken to operating room where MAC anesthesia was induced.  He sterilely prepped draped in the right upper extremity given antibiotics and a timeout was called.  I used ultrasound to evaluate the right upper extremity were identified suitable basilic and cephalic veins which did converge at the antecubital crease.  I elected to make incision above the antecubitum for this reason.  First the area was anesthetized with 1% lidocaine with epinephrine.  A transverse incision was then made I dissected out the vein which was noted to be suitable from external appearance.  It was marked for orientation.  Then dissected deeper through the fascia identified the brachial artery placed a vessel loop around this.  The vein was then transected distally and tied off.  He was dilated up to 3 easily and flushed with heparinized saline.  The artery was then clamped distally proximally opened longitudinally and flushed with heparinized saline.  The vein was then spatulated sewn end-to-side with 6-0 Prolene suture.  Prior to completion allowed flushing all  directions.  At completion there was initially pulsatility in the vein after freeing up some soft tissue he converted to a thrill this was confirmed with Doppler.  There is a palpable radial pulse at the wrist.  Satisfied we irrigated the wound closed in layers with Vicryl and Monocryl.  He was then taken to the PACU after tolerating procedure well without immediate complication.  All counts were correct at completion.  EBL: 20 cc.   Loletha Bertini C. Donzetta Matters, MD [facility] [place] Office: [phone] Pager: [phone]

## 2018-06-12 NOTE — Anesthesia Preprocedure Evaluation (Signed)
Anesthesia Evaluation  Patient identified by MRN, date of birth, ID band Patient awake    Reviewed: Allergy & Precautions, NPO status , Patient's Chart, lab work & pertinent test results  Airway Mallampati: II  TM Distance: >3 FB Neck ROM: Full    Dental no notable dental hx.    Pulmonary neg pulmonary ROS, former smoker,    Pulmonary exam normal breath sounds clear to auscultation       Cardiovascular hypertension, Normal cardiovascular exam Rhythm:Regular Rate:Normal     Neuro/Psych negative neurological ROS  negative psych ROS   GI/Hepatic negative GI ROS, Neg liver ROS,   Endo/Other  diabetes  Renal/GU ESRFRenal disease  negative genitourinary   Musculoskeletal negative musculoskeletal ROS (+)   Abdominal   Peds negative pediatric ROS (+)  Hematology  (+) anemia ,   Anesthesia Other Findings   Reproductive/Obstetrics negative OB ROS                             Anesthesia Physical Anesthesia Plan  ASA: III  Anesthesia Plan: MAC   Post-op Pain Management:    Induction: Intravenous  PONV Risk Score and Plan: 0  Airway Management Planned: Simple Face Mask  Additional Equipment:   Intra-op Plan:   Post-operative Plan:   Informed Consent: I have reviewed the patients History and Physical, chart, labs and discussed the procedure including the risks, benefits and alternatives for the proposed anesthesia with the patient or authorized representative who has indicated his/her understanding and acceptance.   Dental advisory given  Plan Discussed with: CRNA and Surgeon  Anesthesia Plan Comments:         Anesthesia Quick Evaluation

## 2018-06-12 NOTE — Transfer of Care (Signed)
Immediate Anesthesia Transfer of Care Note  Patient: Stephen Lane  Procedure(s) Performed: ARTERIOVENOUS (AV) FISTULA CREATION (Right )  Patient Location: PACU  Anesthesia Type:MAC  Level of Consciousness: awake, alert , oriented and patient cooperative  Airway & Oxygen Therapy: Patient Spontanous Breathing and Patient connected to nasal cannula oxygen  Post-op Assessment: Report given to RN, Post -op Vital signs reviewed and stable and Patient moving all extremities  Post vital signs: Reviewed and stable  Last Vitals:  Vitals Value Taken Time  BP 94/70 06/12/2018  9:23 AM  Temp    Pulse 68 06/12/2018  9:27 AM  Resp 11 06/12/2018  9:27 AM  SpO2 99 % 06/12/2018  9:27 AM  Vitals shown include unvalidated device data.  Last Pain:  Vitals:   06/12/18 0643  PainSc: 0-No pain      Patients Stated Pain Goal: 0 (00/34/96 1164)  Complications: No apparent anesthesia complications

## 2018-06-13 ENCOUNTER — Encounter (HOSPITAL_COMMUNITY): Payer: Self-pay | Admitting: Vascular Surgery

## 2018-06-13 ENCOUNTER — Telehealth: Payer: Self-pay | Admitting: Vascular Surgery

## 2018-06-13 NOTE — Telephone Encounter (Signed)
-----   Message from Gabriel Earing, [place] sent at 06/12/2018  9:16 AM EST ----- S/p right BC AVF 06/12/2018.  F/u with PA in 6 weeks with duplex on Dr. Claretha Cooper clinic day.  Thanks

## 2018-06-13 NOTE — Telephone Encounter (Signed)
sch appt lvm mld ltr 07/26/2018 1pm Dialysis duplex 2pm p/o PA

## 2018-06-17 ENCOUNTER — Other Ambulatory Visit: Payer: Self-pay

## 2018-06-17 DIAGNOSIS — N186 End stage renal disease: Secondary | ICD-10-CM

## 2018-06-17 DIAGNOSIS — Z992 Dependence on renal dialysis: Principal | ICD-10-CM

## 2018-07-23 NOTE — Progress Notes (Signed)
  POST OPERATIVE OFFICE NOTE    CC:  F/u for surgery  HPI:  This is a 58 y.o. male who is s/p right BC AVF by Dr. Donzetta Matters on 06/12/2018.   He is on dialysis on T/T/S at the [facility] location.  He denies any pain or numbness in his hand.   No Known Allergies  Current Outpatient Medications  Medication Sig Dispense Refill  . acetaminophen (TYLENOL) 500 MG tablet Take 1,000 mg by mouth every 6 (six) hours as needed for moderate pain or headache.    . albuterol (PROVENTIL HFA;VENTOLIN HFA) 108 (90 Base) MCG/ACT inhaler Inhale 2 puffs into the lungs every 6 (six) hours as needed for wheezing or shortness of breath. (Patient not taking: Reported on 06/11/2018) 1 Inhaler 0  . amLODipine (NORVASC) 10 MG tablet Take 10 mg by mouth at bedtime.    Marland Kitchen aspirin EC 81 MG tablet Take 1 tablet (81 mg total) by mouth daily. 90 tablet 3  . atorvastatin (LIPITOR) 40 MG tablet Take 1 tablet (40 mg total) by mouth daily at 6 PM. (Patient not taking: Reported on 06/11/2018) 90 tablet 2  . calcitRIOL (ROCALTROL) 0.25 MCG capsule Take 1 capsule (0.25 mcg total) by mouth daily. (Patient not taking: Reported on 06/11/2018) 30 capsule 3  . calcium acetate (PHOSLO) 667 MG capsule Take 3 capsules (2,001 mg total) by mouth 3 (three) times daily with meals. 30 capsule 3  . glipiZIDE (GLUCOTROL XL) 5 MG 24 hr tablet Take 1 tablet (5 mg total) daily with breakfast by mouth. (Patient not taking: Reported on 06/11/2018) 90 tablet 0  . losartan (COZAAR) 50 MG tablet Take 50 mg by mouth every evening.    Marland Kitchen oxyCODONE (ROXICODONE) 5 MG immediate release tablet Take 1 tablet (5 mg total) by mouth every 6 (six) hours as needed. 6 tablet 0  . Tetrahydrozoline HCl (VISINE OP) Place 1 drop into both eyes daily as needed (dry eyes).    . VELPHORO 500 MG chewable tablet Chew 500 mg by mouth 3 (three) times daily with meals.      No current facility-administered medications for this visit.      ROS:  See HPI  Physical Exam:  Today's Vitals   07/24/18 0933  BP: (!) 153/80  Pulse: 75  Resp: 12  Temp: (!) 97 F (36.1 C)  TempSrc: Oral  SpO2: 100%  Weight: 164 lb 0.4 oz (74.4 kg)  Height: $Remove'5\' 2"'xcDzrgq$  (1.575 m)   Body mass index is 30 kg/m.   Incision:  Healed nicely Extremities:  easily  Palpable right BC AVF with excellent thrill.  Unable to palpate right radial pulse.  Motor and sensation are in tact right hand.   Dialysis duplex 07/24/2018: Diameter:  0.40cm-0.61cm Depth:  0.26cm-0.54cm  Assessment/Plan:  This is a 58 y.o. male who is s/p:  right BC AVF by Dr. Donzetta Matters on 06/12/2018.  -pt fistula maturing nicely and has an excellent thrill and easily palpable.   -should be ready for use on 09/11/2018.  After it has been used successfully to satisfaction of the dialysis center, can schedule to have tdc removed.    Leontine Locket, PA-C [facility] [phone]  Clinic MD:  Oneida Alar

## 2018-07-24 ENCOUNTER — Ambulatory Visit (HOSPITAL_COMMUNITY)
Admission: RE | Admit: 2018-07-24 | Discharge: 2018-07-24 | Disposition: A | Discharge status: Home / Self Care | Payer: Medicare Other | Source: Ambulatory Visit | Attending: Vascular Surgery | Admitting: Vascular Surgery

## 2018-07-24 ENCOUNTER — Ambulatory Visit (INDEPENDENT_AMBULATORY_CARE_PROVIDER_SITE_OTHER): Payer: Medicare Other | Admitting: Physician Assistant

## 2018-07-24 ENCOUNTER — Other Ambulatory Visit: Payer: Self-pay

## 2018-07-24 VITALS — BP 153/80 | HR 75 | Temp 97.0°F | Resp 12 | Ht 62.0 in | Wt 164.0 lb

## 2018-07-24 DIAGNOSIS — Z992 Dependence on renal dialysis: Secondary | ICD-10-CM | POA: Diagnosis not present

## 2018-07-24 DIAGNOSIS — N186 End stage renal disease: Secondary | ICD-10-CM

## 2018-07-26 ENCOUNTER — Encounter (HOSPITAL_COMMUNITY): Payer: Medicare Other

## 2018-10-04 ENCOUNTER — Other Ambulatory Visit: Payer: Self-pay

## 2018-10-04 DIAGNOSIS — Z992 Dependence on renal dialysis: Secondary | ICD-10-CM

## 2018-10-04 DIAGNOSIS — N186 End stage renal disease: Secondary | ICD-10-CM

## 2018-10-07 ENCOUNTER — Encounter: Payer: Self-pay | Admitting: Family

## 2018-10-07 ENCOUNTER — Other Ambulatory Visit: Payer: Self-pay

## 2018-10-07 ENCOUNTER — Encounter: Payer: Self-pay | Admitting: *Deleted

## 2018-10-07 ENCOUNTER — Ambulatory Visit (HOSPITAL_COMMUNITY)
Admission: RE | Admit: 2018-10-07 | Discharge: 2018-10-07 | Disposition: A | Discharge status: Home / Self Care | Payer: Medicare Other | Source: Ambulatory Visit | Attending: Family | Admitting: Family

## 2018-10-07 ENCOUNTER — Other Ambulatory Visit: Payer: Self-pay | Admitting: *Deleted

## 2018-10-07 ENCOUNTER — Ambulatory Visit (INDEPENDENT_AMBULATORY_CARE_PROVIDER_SITE_OTHER): Payer: Self-pay | Admitting: Family

## 2018-10-07 VITALS — BP 180/95 | HR 74 | Temp 97.3°F | Resp 14 | Ht 63.0 in | Wt 166.3 lb

## 2018-10-07 DIAGNOSIS — I770 Arteriovenous fistula, acquired: Secondary | ICD-10-CM

## 2018-10-07 DIAGNOSIS — Z992 Dependence on renal dialysis: Secondary | ICD-10-CM | POA: Diagnosis not present

## 2018-10-07 DIAGNOSIS — N186 End stage renal disease: Secondary | ICD-10-CM

## 2018-10-07 NOTE — Progress Notes (Signed)
CC: Follow up AVF creation, Established Dialysis Access  History of Present Illness  Stephen Lane is a 58 y.o. (1960-11-20) male who is s/p right BC AVF by Dr. Donzetta Matters on 06/12/2018.    He is on dialysis on T/T/S at the [facility] location.   He denies any pain or numbness in his right hand.  He was last evaluated on 07-24-18 by S. Rhyne PA-C. At that time fistula was maturing nicely and had an excellent thrill and easily palpable.   Should be ready for use on 09/11/2018.  After it has been used successfully to satisfaction of the dialysis center, can schedule to have tdc removed.  Pt returns today for evaluation of right upper arm AVF. Pt is Spanish speaking, interpreter present. Pt states that the [facility] has tried accessing his right upper am AVF, the first two attempts was using both the [facility] and the AVF.  The first 2 attempts at access for two needles in the AVF, was successful, third attempt caused swelling of his right upper arm. The 4th attempt at two needle access in right upper arm was not successful due to swelling.   He is under evaluation at [facility] for kidney transplant.   Bilateral carotid artery stenosis, last carotid duplex in 2014 (1-39% bilateral extracranial ICA stenosis), he has not had a CEA, has had a cardiac stent placed in 2013.  Stroke hx: none, no TIA  Pt states his nephrologist told him that his kidneys failed due to uncontrolled hypertension and uncontrolled DM.  He is hypertensive today, he denies headache, denies dyspnea, denies chest pain.    Tobacco history: smoked 2.5 ppd x 10 years, quit in the 1989.   He takes 81 mg daily ASA, no other antiplatelet nor anticoagulant.    Past Medical History:  Diagnosis Date  . Carotid stenosis    a. Carotid US (05/2013):  1-39% bilateral ICA; brachial waveforms triphasic bilat; vertebrals with antegrade flow bilat; repeat 1 year  . Chronic high back pain    "where the lungs are located"  (01/26/2015)  . Coronary atherosclerosis    a. admx with [place] 11/13 => LHC 04/15/12: pLAD 50%, oD1 75% (small), pCFX 40% followed by mCFX 95%, pRCA 60%, mRCA 90%, EF 55-65%. PCI 04/15/12: DES to the [facility] and DES to the [facility].  . Daily headache   . ESRD (end stage renal disease) on dialysis ([place])    "TTS; [facility]; [address] (07/18/2017)  . Essential hypertension   . Mixed hyperlipidemia   . Pneumonia 01/2015  . Type II diabetes mellitus ([place])     Social History Social History   Tobacco Use  . Smoking status: Former Smoker    Packs/day: 0.25    Years: 10.00    Pack years: 2.50    Types: Cigarettes  . Smokeless tobacco: Never Used  . Tobacco comment: "quit smoking cigarettes in the 1980's"  Substance Use Topics  . Alcohol use: Yes    Comment: "quit drinking in ~ 2010"  . Drug use: No    Family History Family History  Problem Relation Age of Onset  . Hypertension Mother        died 61 years old unknown reasons  . Other Father        died when Daaron was 46 years old from "infection"  . Diabetes Brother   . Hypertension Sister     Surgical History Past Surgical History:  Procedure Laterality Date  .  AV FISTULA PLACEMENT Left 01/28/2015   Procedure: LEFT BRACHIAL-CEPHALIC ARTERIOVENOUS (AV) FISTULA CREATION;  Surgeon: Mal Misty, MD;  Location: [place];  Service: Vascular;  Laterality: Left;  . AV FISTULA PLACEMENT Right 06/12/2018   Procedure: ARTERIOVENOUS (AV) FISTULA CREATION;  Surgeon: Waynetta Sandy, MD;  Location: [place];  Service: Vascular;  Laterality: Right;  . CATARACT EXTRACTION W/ INTRAOCULAR LENS IMPLANT Left   . CORONARY ANGIOPLASTY WITH STENT PLACEMENT  04/15/2012  . EYE SURGERY Right 2007   "blood behind eyeball"  . INSERTION OF DIALYSIS CATHETER N/A 01/28/2015   Procedure: ULTRASOUND GUIDED INSERTION OF HEMODIALYSIS CATHETER RIGHT IJ;  Surgeon: Mal Misty, MD;  Location: [place];  Service: Vascular;  Laterality: N/A;  . LEFT HEART  CATHETERIZATION WITH CORONARY ANGIOGRAM N/A 04/15/2012   Procedure: LEFT HEART CATHETERIZATION WITH CORONARY ANGIOGRAM;  Surgeon: Larey Dresser, MD;  Location: [facility] CATH LAB;  Service: Cardiovascular;  Laterality: N/A;  . LIGATION OF COMPETING BRANCHES OF ARTERIOVENOUS FISTULA Left 03/10/2015   Procedure: LIGATION OF COMPETING BRANCHES OF LEFT ARM BRACHIOCEPHALIC ARTERIOVENOUS FISTULA;  Surgeon: Mal Misty, MD;  Location: [place];  Service: Vascular;  Laterality: Left;  . PERCUTANEOUS CORONARY STENT INTERVENTION (PCI-S) N/A 04/15/2012   Procedure: PERCUTANEOUS CORONARY STENT INTERVENTION (PCI-S);  Surgeon: Sherren Mocha, MD;  Location: [facility] CATH LAB;  Service: Cardiovascular;  Laterality: N/A;    No Known Allergies  Current Outpatient Medications  Medication Sig Dispense Refill  . amLODipine (NORVASC) 10 MG tablet Take 10 mg by mouth at bedtime.    Marland Kitchen aspirin EC 81 MG tablet Take 1 tablet (81 mg total) by mouth daily. 90 tablet 3  . calcitRIOL (ROCALTROL) 0.25 MCG capsule Take 1 capsule (0.25 mcg total) by mouth daily. 30 capsule 3  . calcium acetate (PHOSLO) 667 MG capsule Take 3 capsules (2,001 mg total) by mouth 3 (three) times daily with meals. 30 capsule 3  . glipiZIDE (GLUCOTROL XL) 5 MG 24 hr tablet Take 1 tablet (5 mg total) daily with breakfast by mouth. 90 tablet 0  . VELPHORO 500 MG chewable tablet Chew 500 mg by mouth 3 (three) times daily with meals.      No current facility-administered medications for this visit.      REVIEW OF SYSTEMS: see HPI for pertinent positives and negatives    PHYSICAL EXAMINATION:  Vitals:   10/07/18 1228  BP: (!) 180/95  Pulse: 74  Resp: 14  Temp: (!) 97.3 F (36.3 C)  TempSrc: Oral  SpO2: 98%  Weight: 166 lb 4.8 oz (75.4 kg)  Height: $Remove'5\' 3"'aNykxkz$  (1.6 m)   Body mass index is 29.46 kg/m.  General: The patient appears his stated age.   HEENT:  No gross abnormalities Pulmonary: Respirations are non-labored, few rales and wheezes right mid  lung fields, other fields with good air movement.  Abdomen: Soft and non-tender with. Musculoskeletal: There are no major deformities.   Neurologic: No focal weakness or paresthesias are detected. Bilateral hand grip muscle strength is 5/5.  Skin: There are no ulcer or rashes noted. Psychiatric: The patient has normal affect. Cardiovascular: There is a regular rate and rhythm without significant murmur appreciated.  Right upper arm with signs of mild infiltration, mild swelling, no pitting edema, fading ecchymosis. AVF with strong palpable thrill, and good bruit.  Bilateral radial pulses are 2+ palpable.   Non-Invasive Vascular Imaging  Right arm Access Duplex  (Date: 10/07/2018):  Findings: +--------------------+----------+-----------------+--------+ AVF  PSV (cm/s)Flow Vol (mL/min)Comments +--------------------+----------+-----------------+--------+ Native artery inflow   279          1272                +--------------------+----------+-----------------+--------+ AVF Anastomosis        672                              +--------------------+----------+-----------------+--------+   +------------+----------+-------------+----------+-------------+ OUTFLOW VEINPSV (cm/s)Diameter (cm)Depth (cm)  Describe    +------------+----------+-------------+----------+-------------+ Prox UA        180        0.61        0.87                 +------------+----------+-------------+----------+-------------+ Mid UA         279        0.63        0.63   branch 0.45cm +------------+----------+-------------+----------+-------------+ Dist UA        678        0.37        0.68                 +------------+----------+-------------+----------+-------------+ AC Fossa       385        0.45        0.67                 +------------+----------+-------------+----------+-------------+   Summary: Patent right brachiocephalic AVF.  Medical Decision  Making  Khairi Stephon Weathers is a 58 y.o. male who is s/p right BC AVF by Dr. Donzetta Matters on 06/12/2018.      I discussed with Dr. Trula Slade pertinent HPI, exam results, and right upper arm AVF duplex results from today.  Small diameters of AVF, will schedule fistula gram this week on a non HD day.    Clemon Chambers, RN, MSN, FNP-C [facility] [place] Office: [phone]  10/07/2018, 12:33 PM  Clinic MD: Trula Slade

## 2018-10-07 NOTE — H&P (View-Only) (Signed)
CC: Follow up AVF creation, Established Dialysis Access  History of Present Illness  Stephen Lane is a 58 y.o. ([birth year]) male who is s/p right BC AVF by Dr. Donzetta Matters on 06/12/2018.    He is on dialysis on T/T/S at the [facility] location.   He denies any pain or numbness in his right hand.  He was last evaluated on 07-24-18 by S. Rhyne PA-C. At that time fistula was maturing nicely and had an excellent thrill and easily palpable.   Should be ready for use on 09/11/2018.  After it has been used successfully to satisfaction of the dialysis center, can schedule to have tdc removed.  Pt returns today for evaluation of right upper arm AVF. Pt is Spanish speaking, interpreter present. Pt states that the [facility] has tried accessing his right upper am AVF, the first two attempts was using both the [facility] and the AVF.  The first 2 attempts at access for two needles in the AVF, was successful, third attempt caused swelling of his right upper arm. The 4th attempt at two needle access in right upper arm was not successful due to swelling.   He is under evaluation at [facility] for kidney transplant.   Bilateral carotid artery stenosis, last carotid duplex in 2014 (1-39% bilateral extracranial ICA stenosis), he has not had a CEA, has had a cardiac stent placed in 2013.  Stroke hx: none, no TIA  Pt states his nephrologist told him that his kidneys failed due to uncontrolled hypertension and uncontrolled DM.  He is hypertensive today, he denies headache, denies dyspnea, denies chest pain.    Tobacco history: smoked 2.5 ppd x 10 years, quit in the 1989.   He takes 81 mg daily ASA, no other antiplatelet nor anticoagulant.    Past Medical History:  Diagnosis Date  . Carotid stenosis    a. Carotid US (05/2013):  1-39% bilateral ICA; brachial waveforms triphasic bilat; vertebrals with antegrade flow bilat; repeat 1 year  . Chronic high back pain    "where the lungs are located"  (01/26/2015)  . Coronary atherosclerosis    a. admx with [place] 11/13 => LHC 04/15/12: pLAD 50%, oD1 75% (small), pCFX 40% followed by mCFX 95%, pRCA 60%, mRCA 90%, EF 55-65%. PCI 04/15/12: DES to the [facility] and DES to the [facility].  . Daily headache   . ESRD (end stage renal disease) on dialysis ([place])    "TTS; [facility]; [address] (07/18/2017)  . Essential hypertension   . Mixed hyperlipidemia   . Pneumonia 01/2015  . Type II diabetes mellitus ([place])     Social History Social History   Tobacco Use  . Smoking status: Former Smoker    Packs/day: 0.25    Years: 10.00    Pack years: 2.50    Types: Cigarettes  . Smokeless tobacco: Never Used  . Tobacco comment: "quit smoking cigarettes in the 1980's"  Substance Use Topics  . Alcohol use: Yes    Comment: "quit drinking in ~ 2010"  . Drug use: No    Family History Family History  Problem Relation Age of Onset  . Hypertension Mother        died 49 years old unknown reasons  . Other Father        died when Khayree was 77 years old from "infection"  . Diabetes Brother   . Hypertension Sister     Surgical History Past Surgical History:  Procedure Laterality Date  .  AV FISTULA PLACEMENT Left 01/28/2015   Procedure: LEFT BRACHIAL-CEPHALIC ARTERIOVENOUS (AV) FISTULA CREATION;  Surgeon: Mal Misty, MD;  Location: [place];  Service: Vascular;  Laterality: Left;  . AV FISTULA PLACEMENT Right 06/12/2018   Procedure: ARTERIOVENOUS (AV) FISTULA CREATION;  Surgeon: Waynetta Sandy, MD;  Location: [place];  Service: Vascular;  Laterality: Right;  . CATARACT EXTRACTION W/ INTRAOCULAR LENS IMPLANT Left   . CORONARY ANGIOPLASTY WITH STENT PLACEMENT  04/15/2012  . EYE SURGERY Right 2007   "blood behind eyeball"  . INSERTION OF DIALYSIS CATHETER N/A 01/28/2015   Procedure: ULTRASOUND GUIDED INSERTION OF HEMODIALYSIS CATHETER RIGHT IJ;  Surgeon: Mal Misty, MD;  Location: [place];  Service: Vascular;  Laterality: N/A;  . LEFT HEART  CATHETERIZATION WITH CORONARY ANGIOGRAM N/A 04/15/2012   Procedure: LEFT HEART CATHETERIZATION WITH CORONARY ANGIOGRAM;  Surgeon: Larey Dresser, MD;  Location: [facility] CATH LAB;  Service: Cardiovascular;  Laterality: N/A;  . LIGATION OF COMPETING BRANCHES OF ARTERIOVENOUS FISTULA Left 03/10/2015   Procedure: LIGATION OF COMPETING BRANCHES OF LEFT ARM BRACHIOCEPHALIC ARTERIOVENOUS FISTULA;  Surgeon: Mal Misty, MD;  Location: [place];  Service: Vascular;  Laterality: Left;  . PERCUTANEOUS CORONARY STENT INTERVENTION (PCI-S) N/A 04/15/2012   Procedure: PERCUTANEOUS CORONARY STENT INTERVENTION (PCI-S);  Surgeon: Sherren Mocha, MD;  Location: [facility] CATH LAB;  Service: Cardiovascular;  Laterality: N/A;    No Known Allergies  Current Outpatient Medications  Medication Sig Dispense Refill  . amLODipine (NORVASC) 10 MG tablet Take 10 mg by mouth at bedtime.    Marland Kitchen aspirin EC 81 MG tablet Take 1 tablet (81 mg total) by mouth daily. 90 tablet 3  . calcitRIOL (ROCALTROL) 0.25 MCG capsule Take 1 capsule (0.25 mcg total) by mouth daily. 30 capsule 3  . calcium acetate (PHOSLO) 667 MG capsule Take 3 capsules (2,001 mg total) by mouth 3 (three) times daily with meals. 30 capsule 3  . glipiZIDE (GLUCOTROL XL) 5 MG 24 hr tablet Take 1 tablet (5 mg total) daily with breakfast by mouth. 90 tablet 0  . VELPHORO 500 MG chewable tablet Chew 500 mg by mouth 3 (three) times daily with meals.      No current facility-administered medications for this visit.      REVIEW OF SYSTEMS: see HPI for pertinent positives and negatives    PHYSICAL EXAMINATION:  Vitals:   10/07/18 1228  BP: (!) 180/95  Pulse: 74  Resp: 14  Temp: (!) 97.3 F (36.3 C)  TempSrc: Oral  SpO2: 98%  Weight: 166 lb 4.8 oz (75.4 kg)  Height: $Remove'5\' 3"'DKOozle$  (1.6 m)   Body mass index is 29.46 kg/m.  General: The patient appears his stated age.   HEENT:  No gross abnormalities Pulmonary: Respirations are non-labored, few rales and wheezes right mid  lung fields, other fields with good air movement.  Abdomen: Soft and non-tender with. Musculoskeletal: There are no major deformities.   Neurologic: No focal weakness or paresthesias are detected. Bilateral hand grip muscle strength is 5/5.  Skin: There are no ulcer or rashes noted. Psychiatric: The patient has normal affect. Cardiovascular: There is a regular rate and rhythm without significant murmur appreciated.  Right upper arm with signs of mild infiltration, mild swelling, no pitting edema, fading ecchymosis. AVF with strong palpable thrill, and good bruit.  Bilateral radial pulses are 2+ palpable.   Non-Invasive Vascular Imaging  Right arm Access Duplex  (Date: 10/07/2018):  Findings: +--------------------+----------+-----------------+--------+ AVF  PSV (cm/s)Flow Vol (mL/min)Comments +--------------------+----------+-----------------+--------+ Native artery inflow   279          1272                +--------------------+----------+-----------------+--------+ AVF Anastomosis        672                              +--------------------+----------+-----------------+--------+   +------------+----------+-------------+----------+-------------+ OUTFLOW VEINPSV (cm/s)Diameter (cm)Depth (cm)  Describe    +------------+----------+-------------+----------+-------------+ Prox UA        180        0.61        0.87                 +------------+----------+-------------+----------+-------------+ Mid UA         279        0.63        0.63   branch 0.45cm +------------+----------+-------------+----------+-------------+ Dist UA        678        0.37        0.68                 +------------+----------+-------------+----------+-------------+ AC Fossa       385        0.45        0.67                 +------------+----------+-------------+----------+-------------+   Summary: Patent right brachiocephalic AVF.  Medical Decision  Making  Stephen Lane is a 58 y.o. male who is s/p right BC AVF by Dr. Donzetta Matters on 06/12/2018.      I discussed with Dr. Trula Slade pertinent HPI, exam results, and right upper arm AVF duplex results from today.  Small diameters of AVF, will schedule fistula gram this week on a non HD day.    Clemon Chambers, RN, MSN, FNP-C [facility] [place] Office: [phone]  10/07/2018, 12:33 PM  Clinic MD: Trula Slade

## 2018-10-07 NOTE — H&P (View-Only) (Signed)
CC: Follow up AVF creation, Established Dialysis Access  History of Present Illness  Stephen Lane is a 58 y.o. (1961/02/05) male who is s/p right BC AVF by Dr. Donzetta Matters on 06/12/2018.    He is on dialysis on T/T/S at the [facility] location.   He denies any pain or numbness in his right hand.  He was last evaluated on 07-24-18 by S. Rhyne PA-C. At that time fistula was maturing nicely and had an excellent thrill and easily palpable.   Should be ready for use on 09/11/2018.  After it has been used successfully to satisfaction of the dialysis center, can schedule to have tdc removed.  Pt returns today for evaluation of right upper arm AVF. Pt is Spanish speaking, interpreter present. Pt states that the [facility] has tried accessing his right upper am AVF, the first two attempts was using both the [facility] and the AVF.  The first 2 attempts at access for two needles in the AVF, was successful, third attempt caused swelling of his right upper arm. The 4th attempt at two needle access in right upper arm was not successful due to swelling.   He is under evaluation at [facility] for kidney transplant.   Bilateral carotid artery stenosis, last carotid duplex in 2014 (1-39% bilateral extracranial ICA stenosis), he has not had a CEA, has had a cardiac stent placed in 2013.  Stroke hx: none, no TIA  Pt states his nephrologist told him that his kidneys failed due to uncontrolled hypertension and uncontrolled DM.  He is hypertensive today, he denies headache, denies dyspnea, denies chest pain.    Tobacco history: smoked 2.5 ppd x 10 years, quit in the 1989.   He takes 81 mg daily ASA, no other antiplatelet nor anticoagulant.    Past Medical History:  Diagnosis Date  . Carotid stenosis    a. Carotid US (05/2013):  1-39% bilateral ICA; brachial waveforms triphasic bilat; vertebrals with antegrade flow bilat; repeat 1 year  . Chronic high back pain    "where the lungs are located"  (01/26/2015)  . Coronary atherosclerosis    a. admx with [place] 11/13 => LHC 04/15/12: pLAD 50%, oD1 75% (small), pCFX 40% followed by mCFX 95%, pRCA 60%, mRCA 90%, EF 55-65%. PCI 04/15/12: DES to the [facility] and DES to the [facility].  . Daily headache   . ESRD (end stage renal disease) on dialysis ([place])    "TTS; [facility]; [address] (07/18/2017)  . Essential hypertension   . Mixed hyperlipidemia   . Pneumonia 01/2015  . Type II diabetes mellitus ([place])     Social History Social History   Tobacco Use  . Smoking status: Former Smoker    Packs/day: 0.25    Years: 10.00    Pack years: 2.50    Types: Cigarettes  . Smokeless tobacco: Never Used  . Tobacco comment: "quit smoking cigarettes in the 1980's"  Substance Use Topics  . Alcohol use: Yes    Comment: "quit drinking in ~ 2010"  . Drug use: No    Family History Family History  Problem Relation Age of Onset  . Hypertension Mother        died 44 years old unknown reasons  . Other Father        died when [name] was 33 years old from "infection"  . Diabetes Brother   . Hypertension Sister     Surgical History Past Surgical History:  Procedure Laterality Date  .  AV FISTULA PLACEMENT Left 01/28/2015   Procedure: LEFT BRACHIAL-CEPHALIC ARTERIOVENOUS (AV) FISTULA CREATION;  Surgeon: Mal Misty, MD;  Location: [place];  Service: Vascular;  Laterality: Left;  . AV FISTULA PLACEMENT Right 06/12/2018   Procedure: ARTERIOVENOUS (AV) FISTULA CREATION;  Surgeon: Waynetta Sandy, MD;  Location: [place];  Service: Vascular;  Laterality: Right;  . CATARACT EXTRACTION W/ INTRAOCULAR LENS IMPLANT Left   . CORONARY ANGIOPLASTY WITH STENT PLACEMENT  04/15/2012  . EYE SURGERY Right 2007   "blood behind eyeball"  . INSERTION OF DIALYSIS CATHETER N/A 01/28/2015   Procedure: ULTRASOUND GUIDED INSERTION OF HEMODIALYSIS CATHETER RIGHT IJ;  Surgeon: Mal Misty, MD;  Location: [place];  Service: Vascular;  Laterality: N/A;  . LEFT HEART  CATHETERIZATION WITH CORONARY ANGIOGRAM N/A 04/15/2012   Procedure: LEFT HEART CATHETERIZATION WITH CORONARY ANGIOGRAM;  Surgeon: Larey Dresser, MD;  Location: [facility] CATH LAB;  Service: Cardiovascular;  Laterality: N/A;  . LIGATION OF COMPETING BRANCHES OF ARTERIOVENOUS FISTULA Left 03/10/2015   Procedure: LIGATION OF COMPETING BRANCHES OF LEFT ARM BRACHIOCEPHALIC ARTERIOVENOUS FISTULA;  Surgeon: Mal Misty, MD;  Location: [place];  Service: Vascular;  Laterality: Left;  . PERCUTANEOUS CORONARY STENT INTERVENTION (PCI-S) N/A 04/15/2012   Procedure: PERCUTANEOUS CORONARY STENT INTERVENTION (PCI-S);  Surgeon: Sherren Mocha, MD;  Location: [facility] C[facility] LAB;  Service: Cardiovascular;  Laterality: N/A;    No Known Allergies  Current Outpatient Medications  Medication Sig Dispense Refill  . amLODipine (NORVASC) 10 MG tablet Take 10 mg by mouth at bedtime.    Marland Kitchen aspirin EC 81 MG tablet Take 1 tablet (81 mg total) by mouth daily. 90 tablet 3  . calcitRIOL (ROCALTROL) 0.25 MCG capsule Take 1 capsule (0.25 mcg total) by mouth daily. 30 capsule 3  . calcium acetate (PHOSLO) 667 MG capsule Take 3 capsules (2,001 mg total) by mouth 3 (three) times daily with meals. 30 capsule 3  . glipiZIDE (GLUCOTROL XL) 5 MG 24 hr tablet Take 1 tablet (5 mg total) daily with breakfast by mouth. 90 tablet 0  . VELPHORO 500 MG chewable tablet Chew 500 mg by mouth 3 (three) times daily with meals.      No current facility-administered medications for this visit.      REVIEW OF SYSTEMS: see HPI for pertinent positives and negatives    PHYSICAL EXAMINATION:  Vitals:   10/07/18 1228  BP: (!) 180/95  Pulse: 74  Resp: 14  Temp: (!) 97.3 F (36.3 C)  TempSrc: Oral  SpO2: 98%  Weight: 166 lb 4.8 oz (75.4 kg)  Height: $Remove'5\' 3"'Hfepkhg$  (1.6 m)   Body mass index is 29.46 kg/m.  General: The patient appears his stated age.   HEENT:  No gross abnormalities Pulmonary: Respirations are non-labored, few rales and wheezes right mid  lung fields, other fields with good air movement.  Abdomen: Soft and non-tender with. Musculoskeletal: There are no major deformities.   Neurologic: No focal weakness or paresthesias are detected. Bilateral hand grip muscle strength is 5/5.  Skin: There are no ulcer or rashes noted. Psychiatric: The patient has normal affect. Cardiovascular: There is a regular rate and rhythm without significant murmur appreciated.  Right upper arm with signs of mild infiltration, mild swelling, no pitting edema, fading ecchymosis. AVF with strong palpable thrill, and good bruit.  Bilateral radial pulses are 2+ palpable.   Non-Invasive Vascular Imaging  Right arm Access Duplex  (Date: 10/07/2018):  Findings: +--------------------+----------+-----------------+--------+ AVF  PSV (cm/s)Flow Vol (mL/min)Comments +--------------------+----------+-----------------+--------+ Native artery inflow   279          1272                +--------------------+----------+-----------------+--------+ AVF Anastomosis        672                              +--------------------+----------+-----------------+--------+   +------------+----------+-------------+----------+-------------+ OUTFLOW VEINPSV (cm/s)Diameter (cm)Depth (cm)  Describe    +------------+----------+-------------+----------+-------------+ Prox UA        180        0.61        0.87                 +------------+----------+-------------+----------+-------------+ Mid UA         279        0.63        0.63   branch 0.45cm +------------+----------+-------------+----------+-------------+ Dist UA        678        0.37        0.68                 +------------+----------+-------------+----------+-------------+ AC Fossa       385        0.45        0.67                 +------------+----------+-------------+----------+-------------+   Summary: Patent right brachiocephalic AVF.  Medical Decision  Making  Bentlie Renan Danese is a 58 y.o. male who is s/p right BC AVF by Dr. Donzetta Matters on 06/12/2018.      I discussed with Dr. Trula Slade pertinent HPI, exam results, and right upper arm AVF duplex results from today.  Small diameters of AVF, will schedule fistula gram this week on a non HD day.    Clemon Chambers, RN, MSN, FNP-C [facility] [place] Office: [phone]  10/07/2018, 12:33 PM  Clinic MD: Trula Slade

## 2018-10-11 ENCOUNTER — Encounter (HOSPITAL_COMMUNITY)
Admission: RE | Disposition: A | Discharge status: Home / Self Care | Payer: Self-pay | Source: Home / Self Care | Attending: Vascular Surgery

## 2018-10-11 ENCOUNTER — Other Ambulatory Visit: Payer: Self-pay

## 2018-10-11 ENCOUNTER — Other Ambulatory Visit: Payer: Self-pay | Admitting: *Deleted

## 2018-10-11 ENCOUNTER — Encounter (HOSPITAL_COMMUNITY): Payer: Self-pay | Admitting: Vascular Surgery

## 2018-10-11 ENCOUNTER — Ambulatory Visit (HOSPITAL_COMMUNITY)
Admission: RE | Admit: 2018-10-11 | Discharge: 2018-10-11 | Disposition: A | Discharge status: Home / Self Care | Payer: Medicare Other | Attending: Vascular Surgery | Admitting: Vascular Surgery

## 2018-10-11 DIAGNOSIS — Z79899 Other long term (current) drug therapy: Secondary | ICD-10-CM | POA: Insufficient documentation

## 2018-10-11 DIAGNOSIS — Z940 Kidney transplant status: Secondary | ICD-10-CM | POA: Insufficient documentation

## 2018-10-11 DIAGNOSIS — E1165 Type 2 diabetes mellitus with hyperglycemia: Secondary | ICD-10-CM | POA: Insufficient documentation

## 2018-10-11 DIAGNOSIS — Y832 Surgical operation with anastomosis, bypass or graft as the cause of abnormal reaction of the patient, or of later complication, without mention of misadventure at the time of the procedure: Secondary | ICD-10-CM | POA: Insufficient documentation

## 2018-10-11 DIAGNOSIS — N186 End stage renal disease: Secondary | ICD-10-CM | POA: Insufficient documentation

## 2018-10-11 DIAGNOSIS — Z833 Family history of diabetes mellitus: Secondary | ICD-10-CM | POA: Insufficient documentation

## 2018-10-11 DIAGNOSIS — I6523 Occlusion and stenosis of bilateral carotid arteries: Secondary | ICD-10-CM | POA: Insufficient documentation

## 2018-10-11 DIAGNOSIS — E1122 Type 2 diabetes mellitus with diabetic chronic kidney disease: Secondary | ICD-10-CM | POA: Insufficient documentation

## 2018-10-11 DIAGNOSIS — E782 Mixed hyperlipidemia: Secondary | ICD-10-CM | POA: Insufficient documentation

## 2018-10-11 DIAGNOSIS — Z955 Presence of coronary angioplasty implant and graft: Secondary | ICD-10-CM | POA: Diagnosis not present

## 2018-10-11 DIAGNOSIS — Z7984 Long term (current) use of oral hypoglycemic drugs: Secondary | ICD-10-CM | POA: Diagnosis not present

## 2018-10-11 DIAGNOSIS — I120 Hypertensive chronic kidney disease with stage 5 chronic kidney disease or end stage renal disease: Secondary | ICD-10-CM | POA: Diagnosis not present

## 2018-10-11 DIAGNOSIS — I2510 Atherosclerotic heart disease of native coronary artery without angina pectoris: Secondary | ICD-10-CM | POA: Insufficient documentation

## 2018-10-11 DIAGNOSIS — T82858A Stenosis of vascular prosthetic devices, implants and grafts, initial encounter: Secondary | ICD-10-CM | POA: Insufficient documentation

## 2018-10-11 DIAGNOSIS — Z7982 Long term (current) use of aspirin: Secondary | ICD-10-CM | POA: Insufficient documentation

## 2018-10-11 DIAGNOSIS — Z992 Dependence on renal dialysis: Secondary | ICD-10-CM | POA: Insufficient documentation

## 2018-10-11 DIAGNOSIS — Z87891 Personal history of nicotine dependence: Secondary | ICD-10-CM | POA: Diagnosis not present

## 2018-10-11 DIAGNOSIS — T82898A Other specified complication of vascular prosthetic devices, implants and grafts, initial encounter: Secondary | ICD-10-CM

## 2018-10-11 DIAGNOSIS — Z8249 Family history of ischemic heart disease and other diseases of the circulatory system: Secondary | ICD-10-CM | POA: Insufficient documentation

## 2018-10-11 HISTORY — PX: A/V FISTULAGRAM: CATH118298

## 2018-10-11 HISTORY — PX: PERIPHERAL VASCULAR BALLOON ANGIOPLASTY: CATH118281

## 2018-10-11 LAB — POCT I-STAT 4, (NA,K, GLUC, HGB,HCT)
Glucose, Bld: 220 mg/dL — ABNORMAL HIGH (ref 70–99)
HCT: 46 % (ref 39.0–52.0)
Hemoglobin: 15.6 g/dL (ref 13.0–17.0)
Potassium: 4.4 mmol/L (ref 3.5–5.1)
Sodium: 136 mmol/L (ref 135–145)

## 2018-10-11 LAB — GLUCOSE, CAPILLARY: Glucose-Capillary: 207 mg/dL — ABNORMAL HIGH (ref 70–99)

## 2018-10-11 LAB — POCT I-STAT CREATININE: Creatinine, Ser: 7 mg/dL — ABNORMAL HIGH (ref 0.61–1.24)

## 2018-10-11 SURGERY — A/V FISTULAGRAM
Anesthesia: LOCAL

## 2018-10-11 MED ORDER — LIDOCAINE HCL (PF) 1 % IJ SOLN
INTRAMUSCULAR | Status: DC | PRN
  Administered 2018-10-11: 2 mL via INTRADERMAL

## 2018-10-11 MED ORDER — HEPARIN (PORCINE) IN NACL 1000-0.9 UT/500ML-% IV SOLN
INTRAVENOUS | Status: AC
  Filled 2018-10-11: qty 1000

## 2018-10-11 MED ORDER — ONDANSETRON HCL 4 MG/2ML IJ SOLN: 4.0000 mg | Freq: Four times a day (QID) | INTRAMUSCULAR | Status: DC | PRN

## 2018-10-11 MED ORDER — LABETALOL HCL 5 MG/ML IV SOLN: 10.0000 mg | INTRAVENOUS | Status: DC | PRN

## 2018-10-11 MED ORDER — FENTANYL CITRATE (PF) 100 MCG/2ML IJ SOLN
INTRAMUSCULAR | Status: DC | PRN
  Administered 2018-10-11: 25 ug via INTRAVENOUS

## 2018-10-11 MED ORDER — ACETAMINOPHEN 325 MG PO TABS: 650.0000 mg | ORAL_TABLET | ORAL | Status: DC | PRN

## 2018-10-11 MED ORDER — HYDRALAZINE HCL 20 MG/ML IJ SOLN: 5.0000 mg | INTRAMUSCULAR | Status: DC | PRN

## 2018-10-11 MED ORDER — SODIUM CHLORIDE 0.9 % IV SOLN: 250.0000 mL | INTRAVENOUS | Status: DC | PRN

## 2018-10-11 MED ORDER — HEPARIN SODIUM (PORCINE) 1000 UNIT/ML IJ SOLN
INTRAMUSCULAR | Status: DC | PRN
  Administered 2018-10-11: 3000 [IU] via INTRAVENOUS

## 2018-10-11 MED ORDER — HEPARIN (PORCINE) IN NACL 1000-0.9 UT/500ML-% IV SOLN
INTRAVENOUS | Status: DC | PRN
  Administered 2018-10-11: 500 mL

## 2018-10-11 MED ORDER — HEPARIN SODIUM (PORCINE) 1000 UNIT/ML IJ SOLN
INTRAMUSCULAR | Status: AC
  Filled 2018-10-11: qty 1

## 2018-10-11 MED ORDER — IODIXANOL 320 MG/ML IV SOLN
INTRAVENOUS | Status: DC | PRN
  Administered 2018-10-11: 08:00:00 45 mL

## 2018-10-11 MED ORDER — OXYCODONE HCL 5 MG PO TABS: 5.0000 mg | ORAL_TABLET | ORAL | Status: DC | PRN

## 2018-10-11 MED ORDER — SODIUM CHLORIDE 0.9% FLUSH: 3.0000 mL | Freq: Two times a day (BID) | INTRAVENOUS | Status: DC

## 2018-10-11 MED ORDER — SODIUM CHLORIDE 0.9% FLUSH: 3.0000 mL | INTRAVENOUS | Status: DC | PRN

## 2018-10-11 MED ORDER — MORPHINE SULFATE (PF) 10 MG/ML IV SOLN: 2.0000 mg | INTRAVENOUS | Status: DC | PRN

## 2018-10-11 MED ORDER — FENTANYL CITRATE (PF) 100 MCG/2ML IJ SOLN
INTRAMUSCULAR | Status: AC
  Filled 2018-10-11: qty 2

## 2018-10-11 MED ORDER — LIDOCAINE HCL (PF) 1 % IJ SOLN
INTRAMUSCULAR | Status: AC
  Filled 2018-10-11: qty 30

## 2018-10-11 SURGICAL SUPPLY — 14 items
BAG SNAP BAND KOVER 36X36 (MISCELLANEOUS) ×3 IMPLANT
BALLN MUSTANG 10.0X40 75 (BALLOONS) ×3
BALLOON MUSTANG 10.0X40 75 (BALLOONS) IMPLANT
COVER DOME SNAP 22 D (MISCELLANEOUS) ×3 IMPLANT
KIT ENCORE 26 ADVANTAGE (KITS) ×1 IMPLANT
KIT MICROPUNCTURE NIT STIFF (SHEATH) ×1 IMPLANT
PROTECTION STATION PRESSURIZED (MISCELLANEOUS) ×3
SHEATH PINNACLE R/O II 6F 4CM (SHEATH) ×1 IMPLANT
SHEATH PROBE COVER 6X72 (BAG) ×1 IMPLANT
STATION PROTECTION PRESSURIZED (MISCELLANEOUS) ×2 IMPLANT
STOPCOCK MORSE 400PSI 3WAY (MISCELLANEOUS) ×3 IMPLANT
TRAY PV CATH (CUSTOM PROCEDURE TRAY) ×3 IMPLANT
TUBING CIL FLEX 10 FLL-RA (TUBING) ×3 IMPLANT
WIRE BENTSON .035X145CM (WIRE) ×1 IMPLANT

## 2018-10-11 NOTE — Progress Notes (Signed)
Discharge instructions given to pt and pt's sister ( Sister over speaker phone) with interpreter.  Also given in spanish written instructions.  All verbalize understanding and deny further questions.

## 2018-10-11 NOTE — Op Note (Signed)
Procedure: Right upper extremity fistulogram, angioplasty of right subclavian and innominate vein (10 x 4)  Preoperative diagnosis: Poorly functioning right arm AV fistula  Postoperative diagnosis: Same  Anesthesia: Local with IV sedation  Operative findings:  1.  70% narrowing right subclavian innominate junction with some mechanical obstruction from right side dialysis catheter in addition to underlying narrowing angioplastied to less than 20% residual stenosis  2.  Multiple large side branches mid proximal fistula  3.  Patent arterial inflow with no significant stenosis  Operative details: After obtaining informed consent, the patient was brought to the [facility] lab.  The patient was placed in supine position Angie table.  Patient's right upper extremities prepped and draped in usual sterile fashion.  Ultrasound was used to locate the patient's right upper arm AV fistula.  This was patent.  An image was obtained for the record.  Local anesthesia was infiltrated over the proximal aspect of the fistula.  Using ultrasound guidance a micropuncture needle was used to successfully cannulate the fistula and the micropuncture wire advanced into the fistula under fluoroscopic guidance.  A micropuncture sheath was then placed over the guidewire.  Contrast angiogram was then obtained through the micropuncture sheath.  In the central venous system there is a right side dialysis catheter which occupies approximately 20% of the right subclavian innominate junction.  There is also underlying narrowing of this vein of about 70%.  There are collaterals that are filling secondary to this obstruction.  At this point additional contrast angiogram was obtained of the midsection of the fistula which was widely patent with no significant stenosis.  Patient was given 3000 units of intravenous heparin.  Micropuncture sheath was upsized over an 035 Bentson wire for a 6 French short sheath.  A 10 x 4 Mustang angioplasty balloon  was brought up on the operative field and centered on the lesion right at the level of the clavicle proximal portion.  This was then inflated to nominal pressure for 1 minute.  2 overlapping inflations were performed.  Completion angiogram showed reversal of flow in the collaterals at this point in a much larger channel through the subclavian innominate junction.  There was less than 20% residual stenosis most of which was mechanical from an underlying dialysis catheter.  At this point with pressure on the outflow of the fistula contrast angiogram was performed to the proximal aspect in the inflow.  The arterial anastomosis is widely patent.  There is several large side branches in the proximal aspect of the fistula with abundant collaterals.  At this point and a figure-of-eight Monocryl stitch was placed at the exit site of the sheath the sheath was removed and hemostasis obtained with direct pressure.  The patient tolerated procedure well and there were no complications.  The patient was taken to the holding area in stable condition.  Operative management: Patient has now had treatment of a central vein stenosis and should have increased flow through the fistula.  He does have several large side branches proximally.  He will be scheduled in the near future for ligation of side branches in the operating room on a non-hemodialysis day.  The fistula should be usable after sidebranch ligation is completed.  Ruta Hinds, MD [facility] [place] Office: [phone] Pager: [phone]

## 2018-10-11 NOTE — Discharge Instructions (Signed)
Fistulografa de dilisis, cuidados posteriores Dialysis Fistulogram, Care After Esta hoja le proporciona informacin sobre cmo cuidarse despus del procedimiento. Su mdico tambin podr darle instrucciones ms especficas. Comunquese con su mdico si tiene problemas o preguntas. Qu puedo esperar despus del procedimiento? Despus del procedimiento, es comn [employer] siguientes sntomas:  Una pequea molestia en la zona en la que se coloc el tubo delgado y pequeo (catter) para el procedimiento.  Un pequeo hematoma alrededor de la fstula.  Somnolencia y cansancio Company secretary). Siga estas indicaciones en su casa: Actividad   Haga reposo en su casa y no levante objetos que pesen ms de 5 lb (2,3 kg) el da despus del procedimiento.  Reanude sus actividades normales segn lo indicado por el mdico. Pregntele al mdico qu actividades son seguras para usted.  No conduzca ni use maquinaria pesada mientras toma analgsicos recetados.  No conduzca durante 24horas si recibi un medicamento para ayudarlo a relajarse (sedante) durante el procedimiento. Medicamentos   [employer]phi de venta libre y los recetados solamente como se lo haya indicado el mdico. Cuidado del Environmental consultant de la puncin  Siga las indicaciones de su mdico acerca de cmo Government social research officer donde se insertaron los catteres. Asegrese de hacer lo siguiente: ? Lvese las manos con agua y jabn antes de Quar[place] manager las vendas (vendaje). Use desinfectante para manos si no dispone de [place] y [place]. ? Cambie el vendaje como se lo haya indicado el mdico. ? No retire los puntos (suturas), la goma para cerrar la piel o las tiras [place]. Es posible que estos cierres cutneos Animal nutritionist en la piel durante 2semanas o ms. Si los bordes de las tiras adhesivas empiezan a despegarse y Therapist, sports, puede recortar los que estn sueltos. No retire las tiras [facility] por completo a menos que el mdico se lo indique.  Controle  la zona de la puncin todos los das para detectar signos de infeccin. Est atento a los siguientes signos: ? Dolor, hinchazn o enrojecimiento. ? Lquido o sangre. ? Calor. ? Pus o mal olor. Instrucciones generales  No tome baos de inmersin, no nade ni use el jacuzzi hasta que el mdico lo autorice. Pregntele al mdico si puede ducharse. Thurston Pounds solo le permitan darse baos de [place].  Controle atentamente la fstula de dilisis. Verifique para asegurarse de que puede sentir una vibracin o un zumbido (frmito) cuando Risk manager los dedos sobre la fstula.  Evite daar el injerto o la fstula: ? No use ropa ajustada ni joyas en el brazo o la pierna que tiene el injerto o la fstula. ? Informe a todos sus mdicos que tiene un injerto o una fstula para dilisis. ? No permita extracciones de sangre, terapias intravenosas ni lecturas de la presin arterial en el brazo que tiene la fstula o el injerto. ? No permita que le apliquen vacunas antigripales ni otras vacunas en el brazo con la fstula o el injerto.  Concurra a todas las visitas de seguimiento como se lo haya indicado el mdico. Esto es importante. Comunquese con un mdico si:  Tiene enrojecimiento, hinchazn o dolor en el lugar donde se insert el catter.  Observa lquido o sangre que proviene del lugar de la insercin del catter.  El lugar de la insercin del catter est caliente al tacto.  Tiene pus o percibe mal olor que proviene del lugar de la insercin del catter.  Tiene fiebre o siente escalofros. Solicite ayuda de inmediato si:  Se siente dbil.  Tiene problemas de equilibrio.  Tiene dificultad para mover los brazos o las piernas.  Tiene problemas visuales o para hablar.  Ya no puede sentir una vibracin o un zumbido cuando [employer] dedos sobre la fstula de dilisis.  La extremidad que se [place] para el procedimiento: ? Se hincha. ? Duele. ? Est fra. ? Cambia de color, por ejemplo, se torna  azulada o blanco plido.  Siente falta de aire o Tourist information centre manager. Resumen  Despus de [place] fistulografa de dilisis, es comn tener una pequea molestia o algunos moretones en la zona donde se coloc el tubo delgado y pequeo (catter).  Descanse en su [place] despus del procedimiento. Reanude sus actividades normales segn lo indicado por el mdico.  Delphi de venta libre y los recetados solamente como se lo haya indicado el mdico.  Siga las indicaciones de su mdico acerca de cmo Government social research officer donde se insert el catter.  Concurra a todas las visitas de seguimiento como se lo haya indicado el mdico. Esta informacin no tiene Marine scientist el consejo del mdico. Asegrese de hacerle al mdico cualquier pregunta que tenga. Document Released: 10/06/2013 Document Revised: 07/25/2017 Document Reviewed: 07/25/2017 Elsevier Interactive Patient Education  2019 [employer].

## 2018-10-11 NOTE — Progress Notes (Signed)
Call and spoke with daughter Stephen Lane who speaks english to schedule surgery for branch ligation of right arm A/V fistula. Instructed to be at [facility] admitting at 5:30 am on 10/18/2018. NPO past MN night prior. Expect a call and follow the detailed surgery, pre-op Covid testing instructions received from the hospital pre-admission department. Reviewed visitor restrictions. Verbalized understanding and to call for questions.

## 2018-10-11 NOTE — Interval H&P Note (Signed)
History and Physical Interval Note:  10/11/2018 7:41 AM  Stephen Lane  has presented today for surgery, with the diagnosis of complication with fistula.  The various methods of treatment have been discussed with the patient and family. After consideration of risks, benefits and other options for treatment, the patient has consented to  Procedure(s): A/V FISTULAGRAM - Right Arm (N/A) as a surgical intervention.  The patient's history has been reviewed, patient examined, no change in status, stable for surgery.  I have reviewed the patient's chart and labs.  Questions were answered to the patient's satisfaction.     Ruta Hinds

## 2018-10-14 ENCOUNTER — Other Ambulatory Visit: Payer: Self-pay | Admitting: *Deleted

## 2018-10-16 ENCOUNTER — Other Ambulatory Visit (HOSPITAL_COMMUNITY)
Admission: RE | Admit: 2018-10-16 | Discharge: 2018-10-16 | Disposition: A | Discharge status: Home / Self Care | Payer: Medicare Other | Source: Ambulatory Visit | Attending: Vascular Surgery | Admitting: Vascular Surgery

## 2018-10-16 DIAGNOSIS — Z1159 Encounter for screening for other viral diseases: Secondary | ICD-10-CM | POA: Diagnosis present

## 2018-10-16 LAB — SARS CORONAVIRUS 2 BY RT PCR (HOSPITAL ORDER, PERFORMED IN ~~LOC~~ HOSPITAL LAB): SARS Coronavirus 2: NEGATIVE

## 2018-10-17 ENCOUNTER — Encounter (HOSPITAL_COMMUNITY): Payer: Self-pay | Admitting: *Deleted

## 2018-10-17 ENCOUNTER — Other Ambulatory Visit: Payer: Self-pay

## 2018-10-17 NOTE — Anesthesia Preprocedure Evaluation (Addendum)
Anesthesia Evaluation  Patient identified by MRN, date of birth, ID band Patient awake    Reviewed: Allergy & Precautions, NPO status , Patient's Chart, lab work & pertinent test results  History of Anesthesia Complications Negative for: history of anesthetic complications  Airway Mallampati: II  TM Distance: >3 FB Neck ROM: Full    Dental  (+) Dental Advisory Given, Partial Lower, Partial Upper   Pulmonary former smoker,    Pulmonary exam normal breath sounds clear to auscultation       Cardiovascular hypertension, Pt. on medications + CAD and + Cardiac Stents  Normal cardiovascular exam Rhythm:Regular Rate:Normal  TTE 2016: mild LVH, EF 50-55%, hypokinesis of the mid-apicallateral myocardium, grade 2 diastolic dysfunction, PASP moderately increased (45 mmHg)   Neuro/Psych  Headaches,    GI/Hepatic Neg liver ROS, GERD  Controlled,  Endo/Other  diabetes, Type 2  Renal/GU ESRF and DialysisRenal disease     Musculoskeletal negative musculoskeletal ROS (+)   Abdominal   Peds  Hematology  (+) anemia ,   Anesthesia Other Findings Day of surgery medications reviewed with the patient.  Reproductive/Obstetrics                            Anesthesia Physical Anesthesia Plan  ASA: III  Anesthesia Plan: MAC   Post-op Pain Management:    Induction:   PONV Risk Score and Plan: 1 and Treatment may vary due to age or medical condition and Propofol infusion  Airway Management Planned: Natural Airway and Simple Face Mask  Additional Equipment:   Intra-op Plan:   Post-operative Plan:   Informed Consent: I have reviewed the patients History and Physical, chart, labs and discussed the procedure including the risks, benefits and alternatives for the proposed anesthesia with the patient or authorized representative who has indicated his/her understanding and acceptance.     Dental advisory  given  Plan Discussed with: CRNA  Anesthesia Plan Comments: (Discussion with patient assisted by Spanish interpreter.)       Anesthesia Quick Evaluation

## 2018-10-17 NOTE — Progress Notes (Signed)
Mr. [name] asked me to speak to his daughter Stephen Lane.  [name] reports that patient does not have chest pain or shortness of breath. Mr Finley Chevez had COVID test 15/13/2020- it was non reactive. Mr [name] has been at home with family since test, except he went to dialysis this am.Patient denies that he or his family has experienced any of the following: Cough Fever >100.4 Runny Nose Sore Throat Difficulty breathing/ shortness of breath Travel in past 14 days- hemodialysis  Mr [name] has Type II diabetes, he is not on medication- he does not have a prescription. Stephen Lane  Reports that nephrologist orders medication. I called [name] nurse read that he is supposed to be on Glipizide  5 mg.  I informed her that patient does not have a prescription, she said she will get in touch with MD and have a prescription called to [place]. I called [name] back and informed her, I also informed her that patient should not take it tonight or in am. I inboxed [name] to inform him that patient has not been on medication, but should have a new prescription tomorrow.  I will check with Pharmacy later to see if an order was called in. [name] reports that CBGs have been running in the 200s.  I instructed [name] to not give him Glipizide in am. I instructed patient to check CBG after awaking and every 2 hours until arrival  to the hospital.  I Instructed patient if CBG is less than 70 to take drink 4 ounces of clear juice,then call pre- op desk at [phone] for for instructions.

## 2018-10-18 ENCOUNTER — Encounter (HOSPITAL_COMMUNITY): Payer: Self-pay

## 2018-10-18 ENCOUNTER — Ambulatory Visit (HOSPITAL_COMMUNITY): Payer: Medicare Other | Admitting: Anesthesiology

## 2018-10-18 ENCOUNTER — Encounter (HOSPITAL_COMMUNITY)
Admission: RE | Disposition: A | Discharge status: Home / Self Care | Payer: Self-pay | Source: Home / Self Care | Attending: Vascular Surgery

## 2018-10-18 ENCOUNTER — Ambulatory Visit (HOSPITAL_COMMUNITY)
Admission: RE | Admit: 2018-10-18 | Discharge: 2018-10-18 | Disposition: A | Discharge status: Home / Self Care | Payer: Medicare Other | Attending: Vascular Surgery | Admitting: Vascular Surgery

## 2018-10-18 DIAGNOSIS — E1122 Type 2 diabetes mellitus with diabetic chronic kidney disease: Secondary | ICD-10-CM | POA: Diagnosis not present

## 2018-10-18 DIAGNOSIS — I2510 Atherosclerotic heart disease of native coronary artery without angina pectoris: Secondary | ICD-10-CM | POA: Insufficient documentation

## 2018-10-18 DIAGNOSIS — Z79899 Other long term (current) drug therapy: Secondary | ICD-10-CM | POA: Insufficient documentation

## 2018-10-18 DIAGNOSIS — N186 End stage renal disease: Secondary | ICD-10-CM | POA: Diagnosis not present

## 2018-10-18 DIAGNOSIS — Z87891 Personal history of nicotine dependence: Secondary | ICD-10-CM | POA: Insufficient documentation

## 2018-10-18 DIAGNOSIS — Z7982 Long term (current) use of aspirin: Secondary | ICD-10-CM | POA: Insufficient documentation

## 2018-10-18 DIAGNOSIS — I6523 Occlusion and stenosis of bilateral carotid arteries: Secondary | ICD-10-CM | POA: Diagnosis not present

## 2018-10-18 DIAGNOSIS — Z992 Dependence on renal dialysis: Secondary | ICD-10-CM | POA: Insufficient documentation

## 2018-10-18 DIAGNOSIS — I120 Hypertensive chronic kidney disease with stage 5 chronic kidney disease or end stage renal disease: Secondary | ICD-10-CM | POA: Diagnosis not present

## 2018-10-18 DIAGNOSIS — Y832 Surgical operation with anastomosis, bypass or graft as the cause of abnormal reaction of the patient, or of later complication, without mention of misadventure at the time of the procedure: Secondary | ICD-10-CM | POA: Diagnosis not present

## 2018-10-18 DIAGNOSIS — T82590A Other mechanical complication of surgically created arteriovenous fistula, initial encounter: Secondary | ICD-10-CM | POA: Insufficient documentation

## 2018-10-18 DIAGNOSIS — Z7984 Long term (current) use of oral hypoglycemic drugs: Secondary | ICD-10-CM | POA: Insufficient documentation

## 2018-10-18 DIAGNOSIS — D631 Anemia in chronic kidney disease: Secondary | ICD-10-CM | POA: Diagnosis not present

## 2018-10-18 DIAGNOSIS — T82898A Other specified complication of vascular prosthetic devices, implants and grafts, initial encounter: Secondary | ICD-10-CM

## 2018-10-18 DIAGNOSIS — Z955 Presence of coronary angioplasty implant and graft: Secondary | ICD-10-CM | POA: Diagnosis not present

## 2018-10-18 HISTORY — PX: LIGATION OF COMPETING BRANCHES OF ARTERIOVENOUS FISTULA: SHX5949

## 2018-10-18 LAB — GLUCOSE, CAPILLARY
Glucose-Capillary: 183 mg/dL — ABNORMAL HIGH (ref 70–99)
Glucose-Capillary: 156 mg/dL — ABNORMAL HIGH (ref 70–99)
Glucose-Capillary: 173 mg/dL — ABNORMAL HIGH (ref 70–99)

## 2018-10-18 LAB — POCT I-STAT 4, (NA,K, GLUC, HGB,HCT)
Glucose, Bld: 173 mg/dL — ABNORMAL HIGH (ref 70–99)
HCT: 36 % — ABNORMAL LOW (ref 39.0–52.0)
Hemoglobin: 12.2 g/dL — ABNORMAL LOW (ref 13.0–17.0)
Potassium: 4.3 mmol/L (ref 3.5–5.1)
Sodium: 136 mmol/L (ref 135–145)

## 2018-10-18 SURGERY — LIGATION OF COMPETING BRANCHES OF ARTERIOVENOUS FISTULA
Anesthesia: Monitor Anesthesia Care | Site: Arm Upper | Laterality: Right

## 2018-10-18 MED ORDER — CEFAZOLIN SODIUM-DEXTROSE 2-4 GM/100ML-% IV SOLN
INTRAVENOUS | Status: AC
  Filled 2018-10-18: qty 100

## 2018-10-18 MED ORDER — PROTAMINE SULFATE 10 MG/ML IV SOLN
INTRAVENOUS | Status: AC
  Filled 2018-10-18: qty 25

## 2018-10-18 MED ORDER — LABETALOL HCL 5 MG/ML IV SOLN
10.0000 mg | Freq: Once | INTRAVENOUS | Status: AC
  Administered 2018-10-18: 10 mg via INTRAVENOUS
  Filled 2018-10-18: qty 4

## 2018-10-18 MED ORDER — OXYCODONE-ACETAMINOPHEN 5-325 MG PO TABS: 1.0000 | ORAL_TABLET | Freq: Four times a day (QID) | ORAL | 0 refills | Status: DC | PRN

## 2018-10-18 MED ORDER — FENTANYL CITRATE (PF) 250 MCG/5ML IJ SOLN
INTRAMUSCULAR | Status: DC | PRN
  Administered 2018-10-18 (×2): 25 ug via INTRAVENOUS

## 2018-10-18 MED ORDER — CHLORHEXIDINE GLUCONATE 4 % EX LIQD: 60.0000 mL | Freq: Once | CUTANEOUS | Status: DC

## 2018-10-18 MED ORDER — LIDOCAINE HCL (PF) 1 % IJ SOLN
INTRAMUSCULAR | Status: DC | PRN
  Administered 2018-10-18: 10 mL

## 2018-10-18 MED ORDER — PROPOFOL 500 MG/50ML IV EMUL
INTRAVENOUS | Status: DC | PRN
  Administered 2018-10-18: 50 ug/kg/min via INTRAVENOUS

## 2018-10-18 MED ORDER — LABETALOL HCL 5 MG/ML IV SOLN
INTRAVENOUS | Status: AC
  Administered 2018-10-18: 10 mg via INTRAVENOUS
  Filled 2018-10-18: qty 4

## 2018-10-18 MED ORDER — FENTANYL CITRATE (PF) 250 MCG/5ML IJ SOLN
INTRAMUSCULAR | Status: AC
  Filled 2018-10-18: qty 5

## 2018-10-18 MED ORDER — 0.9 % SODIUM CHLORIDE (POUR BTL) OPTIME
TOPICAL | Status: DC | PRN
  Administered 2018-10-18: 1000 mL

## 2018-10-18 MED ORDER — LABETALOL HCL 5 MG/ML IV SOLN
10.0000 mg | Freq: Once | INTRAVENOUS | Status: AC
  Administered 2018-10-18: 11:00:00 10 mg via INTRAVENOUS
  Filled 2018-10-18: qty 4

## 2018-10-18 MED ORDER — PROPOFOL 1000 MG/100ML IV EMUL
INTRAVENOUS | Status: AC
  Filled 2018-10-18: qty 100

## 2018-10-18 MED ORDER — MIDAZOLAM HCL 2 MG/2ML IJ SOLN
INTRAMUSCULAR | Status: AC
  Filled 2018-10-18: qty 2

## 2018-10-18 MED ORDER — SODIUM CHLORIDE 0.9 % IV SOLN
INTRAVENOUS | Status: DC
  Administered 2018-10-18: 08:00:00 via INTRAVENOUS

## 2018-10-18 MED ORDER — PROPOFOL 10 MG/ML IV BOLUS
INTRAVENOUS | Status: DC | PRN
  Administered 2018-10-18: 20 mg via INTRAVENOUS

## 2018-10-18 MED ORDER — LIDOCAINE 2% (20 MG/ML) 5 ML SYRINGE
INTRAMUSCULAR | Status: DC | PRN
  Administered 2018-10-18: 30 mg via INTRAVENOUS

## 2018-10-18 MED ORDER — HEPARIN SODIUM (PORCINE) 1000 UNIT/ML IJ SOLN
INTRAMUSCULAR | Status: AC
  Filled 2018-10-18: qty 1

## 2018-10-18 MED ORDER — CEFAZOLIN SODIUM-DEXTROSE 2-4 GM/100ML-% IV SOLN
2.0000 g | INTRAVENOUS | Status: AC
  Administered 2018-10-18: 2 g via INTRAVENOUS

## 2018-10-18 MED ORDER — LIDOCAINE HCL (PF) 1 % IJ SOLN
INTRAMUSCULAR | Status: AC
  Filled 2018-10-18: qty 60

## 2018-10-18 MED ORDER — SODIUM CHLORIDE 0.9 % IV SOLN
INTRAVENOUS | Status: AC
  Filled 2018-10-18: qty 1.2

## 2018-10-18 MED ORDER — PROPOFOL 10 MG/ML IV BOLUS
INTRAVENOUS | Status: AC
  Filled 2018-10-18: qty 20

## 2018-10-18 MED ORDER — MIDAZOLAM HCL 5 MG/5ML IJ SOLN
INTRAMUSCULAR | Status: DC | PRN
  Administered 2018-10-18: 2 mg via INTRAVENOUS

## 2018-10-18 SURGICAL SUPPLY — 31 items
ADH SKN CLS APL DERMABOND .7 (GAUZE/BANDAGES/DRESSINGS) ×1
AGENT HMST SPONGE THK3/8 (HEMOSTASIS)
ARMBAND PINK RESTRICT EXTREMIT (MISCELLANEOUS) ×2 IMPLANT
CANISTER SUCT 3000ML PPV (MISCELLANEOUS) ×2 IMPLANT
CLIP VESOCCLUDE MED 6/CT (CLIP) ×2 IMPLANT
CLIP VESOCCLUDE SM WIDE 6/CT (CLIP) ×2 IMPLANT
COVER PROBE W GEL 5X96 (DRAPES) ×1 IMPLANT
COVER WAND RF STERILE (DRAPES) ×2 IMPLANT
DERMABOND ADVANCED (GAUZE/BANDAGES/DRESSINGS) ×1
DERMABOND ADVANCED .7 DNX12 (GAUZE/BANDAGES/DRESSINGS) ×1 IMPLANT
DRAPE SURG 17X23 STRL (DRAPES) ×1 IMPLANT
ELECT REM PT RETURN 9FT ADLT (ELECTROSURGICAL) ×2
ELECTRODE REM PT RTRN 9FT ADLT (ELECTROSURGICAL) ×1 IMPLANT
GLOVE BIO SURGEON STRL SZ7.5 (GLOVE) ×2 IMPLANT
GOWN STRL REUS W/ TWL LRG LVL3 (GOWN DISPOSABLE) ×3 IMPLANT
GOWN STRL REUS W/TWL LRG LVL3 (GOWN DISPOSABLE) ×6
HEMOSTAT SPONGE AVITENE ULTRA (HEMOSTASIS) IMPLANT
KIT BASIN OR (CUSTOM PROCEDURE TRAY) ×2 IMPLANT
KIT TURNOVER KIT B (KITS) ×2 IMPLANT
LOOP VESSEL MINI RED (MISCELLANEOUS) IMPLANT
NS IRRIG 1000ML POUR BTL (IV SOLUTION) ×2 IMPLANT
PACK CV ACCESS (CUSTOM PROCEDURE TRAY) ×2 IMPLANT
PAD ARMBOARD 7.5X6 YLW CONV (MISCELLANEOUS) ×4 IMPLANT
SUT PROLENE 6 0 CC (SUTURE) ×1 IMPLANT
SUT SILK 0 (SUTURE) IMPLANT
SUT VIC AB 3-0 SH 27 (SUTURE) ×4
SUT VIC AB 3-0 SH 27X BRD (SUTURE) ×1 IMPLANT
SUT VICRYL 4-0 PS2 18IN ABS (SUTURE) ×3 IMPLANT
TOWEL GREEN STERILE (TOWEL DISPOSABLE) ×2 IMPLANT
UNDERPAD 30X30 (UNDERPADS AND DIAPERS) ×2 IMPLANT
WATER STERILE IRR 1000ML POUR (IV SOLUTION) ×2 IMPLANT

## 2018-10-18 NOTE — Progress Notes (Signed)
Dr. Daiva Huge notified patient's BP elevated.  Received verbal order for $RemoveB'10mg'KUQLafpR$  labetalol.  Will continue to monitor patient.

## 2018-10-18 NOTE — Interval H&P Note (Signed)
History and Physical Interval Note:  10/18/2018 10:35 AM  Stephen Lane  has presented today for surgery, with the diagnosis of COMPLICATION WITH ARTERIOVENOUS FISTULA RIGHT ARM.  The various methods of treatment have been discussed with the patient and family. After consideration of risks, benefits and other options for treatment, the patient has consented to  Procedure(s): [place] (Right) as a surgical intervention.  The patient's history has been reviewed, patient examined, no change in status, stable for surgery.  I have reviewed the patient's chart and labs.  Questions were answered to the patient's satisfaction.     Ruta Hinds

## 2018-10-18 NOTE — Progress Notes (Signed)
Dr. Daiva Huge notified patient's BP elevated.  Received verbal order for $RemoveB'10mg'FPlrtmnZ$  of labetalol IV.  WIll continue to monitor patient.

## 2018-10-18 NOTE — Op Note (Signed)
Procedure: Ligation side branches right arm AV fistula, ultrasound right arm AV fistula  Preoperative diagnosis: Poorly maturing right arm AV fistula  Postoperative diagnosis: Same  Anesthesia: Local with IV sedation  Assistant: Gerri Lins, PA-C  Operative findings: 2 discrete large 2 mm side branches ligated 1 mid 1 upper arm  Operative details: After team informed consent, the patient taken the operating.  The patient was placed in supine position operating table.  After adequate sedation patient's entire right upper extremities prepped and draped in usual sterile fashion.  Ultrasound was used to identify the side branches in the fistula.  There was one found about mid upper arm.  There was another one in the proximal upper arm.  Local anesthesia was infiltrated over both of these areas.  A longitudinal incision was then made over the area carried down through subcutaneous tissues down level of fistula.  The fistula had a palpable thrill within it.  The one at the mid forearm the diameter of the fistula in this location was about 4 to 5 mm.  The side branch was dissected free circumferentially and ligated and divided tween silk ties.  Attention was then turned to the upper arm near the shoulder.  An additional longitudinal incision was made in this location carried down through subcutaneous tissues down to the level of fistula.  The side branch here was about 1-1/2 to 2 mm in diameter.  The fistula was about 3 mm in diameter.  It did have good flow within it.  The side branch was dissected free circumferentially and it was ligated divided tween silk ties.  Next the subcutaneous tissues of both incisions was reapproximated with a running 3-0 Vicryl suture.  The skin of both incisions closed with a 4-0 Vicryl subcuticular stitch.  The patient tolerated procedure well and there were no complications.  The instrument sponge and needle count was correct in the case.  The patient was taken the recovery  room in stable condition.  Ruta Hinds, MD [facility] Office: [phone] Pager: [phone]

## 2018-10-18 NOTE — Anesthesia Postprocedure Evaluation (Signed)
Anesthesia Post Note  Patient: Stephen Lane  Procedure(s) Performed: SIDE BRANCH LIGATION OF ARTERIOVENOUS FISTULA RIGHT ARM (Right Arm Upper)     Patient location during evaluation: PACU Anesthesia Type: MAC Level of consciousness: awake and alert Pain management: pain level controlled Vital Signs Assessment: post-procedure vital signs reviewed and stable Respiratory status: spontaneous breathing, nonlabored ventilation and respiratory function stable Cardiovascular status: stable and blood pressure returned to baseline Postop Assessment: no apparent nausea or vomiting Anesthetic complications: no    Last Vitals:  Vitals:   10/18/18 1212 10/18/18 1225  BP: (!) 158/75 (!) 162/80  Pulse: 66 65  Resp: 15 15  Temp: 36.4 C   SpO2: 97% 97%    Last Pain:  Vitals:   10/18/18 1212  TempSrc:   PainSc: 0-No pain                 Brennan Bailey

## 2018-10-18 NOTE — Transfer of Care (Signed)
Immediate Anesthesia Transfer of Care Note  Patient: Stephen Lane  Procedure(s) Performed: SIDE BRANCH LIGATION OF ARTERIOVENOUS FISTULA RIGHT ARM (Right Arm Upper)  Patient Location: PACU  Anesthesia Type:MAC  Level of Consciousness: awake, alert  and oriented  Airway & Oxygen Therapy: Patient Spontanous Breathing  Post-op Assessment: Report given to RN, Post -op Vital signs reviewed and stable and Patient moving all extremities X 4  Post vital signs: Reviewed and stable  Last Vitals:  Vitals Value Taken Time  BP 158/75 10/18/2018 12:12 PM  Temp    Pulse 66 10/18/2018 12:15 PM  Resp 16 10/18/2018 12:15 PM  SpO2 98 % 10/18/2018 12:15 PM  Vitals shown include unvalidated device data.  Last Pain:  Vitals:   10/18/18 1212  TempSrc:   PainSc: 0-No pain      Patients Stated Pain Goal: 0 ([id number])  Complications: No apparent anesthesia complications

## 2018-10-18 NOTE — Discharge Instructions (Signed)
Monitored Anesthesia Care, Care After  These instructions provide you with information about caring for yourself after your procedure. Your health care provider may also give you more specific instructions. Your treatment has been planned according to current medical practices, but problems sometimes occur. Call your health care provider if you have any problems or questions after your procedure.  What can I expect after the procedure?  After your procedure, you may:  Feel sleepy for several hours.  Feel clumsy and have poor balance for several hours.  Feel forgetful about what happened after the procedure.  Have poor judgment for several hours.  Feel nauseous or vomit.  Have a sore throat if you had a breathing tube during the procedure.  Follow these instructions at home:  For at least 24 hours after the procedure:         Have a responsible adult stay with you. It is important to have someone help care for you until you are awake and alert.  Rest as needed.  Do not:  Participate in activities in which you could fall or become injured.  Drive.  Use heavy machinery.  Drink alcohol.  Take sleeping pills or medicines that cause drowsiness.  Make important decisions or sign legal documents.  Take care of children on your own.  Eating and drinking  Follow the diet that is recommended by your health care provider.  If you vomit, drink water, juice, or soup when you can drink without vomiting.  Make sure you have little or no nausea before eating solid foods.  General instructions  Take over-the-counter and prescription medicines only as told by your health care provider.  If you have sleep apnea, surgery and certain medicines can increase your risk for breathing problems. Follow instructions from your health care provider about wearing your sleep device:  Anytime you are sleeping, including during daytime naps.  While taking prescription pain medicines, sleeping medicines, or medicines that make you drowsy.  If you  smoke, do not smoke without supervision.  Keep all follow-up visits as told by your health care provider. This is important.  Contact a health care provider if:  You keep feeling nauseous or you keep vomiting.  You feel light-headed.  You develop a rash.  You have a fever.  Get help right away if:  You have trouble breathing.  Summary  For several hours after your procedure, you may feel sleepy and have poor judgment.  Have a responsible adult stay with you for at least 24 hours or until you are awake and alert.  This information is not intended to replace advice given to you by your health care provider. Make sure you discuss any questions you have with your health care provider.  Document Released: 09/12/2015 Document Revised: 01/05/2017 Document Reviewed: 09/12/2015  Elsevier Interactive Patient Education  2019 [employer]

## 2018-10-19 ENCOUNTER — Encounter (HOSPITAL_COMMUNITY): Payer: Self-pay | Admitting: Vascular Surgery

## 2018-11-16 IMAGING — DX DG CHEST 2V
2 series · 2 of 2 positions shown · non-contrast
Comparison: 01/29/2015

CLINICAL DATA: Cough, weakness, dizziness

EXAM:
CHEST  2 VIEW

[chest pa]
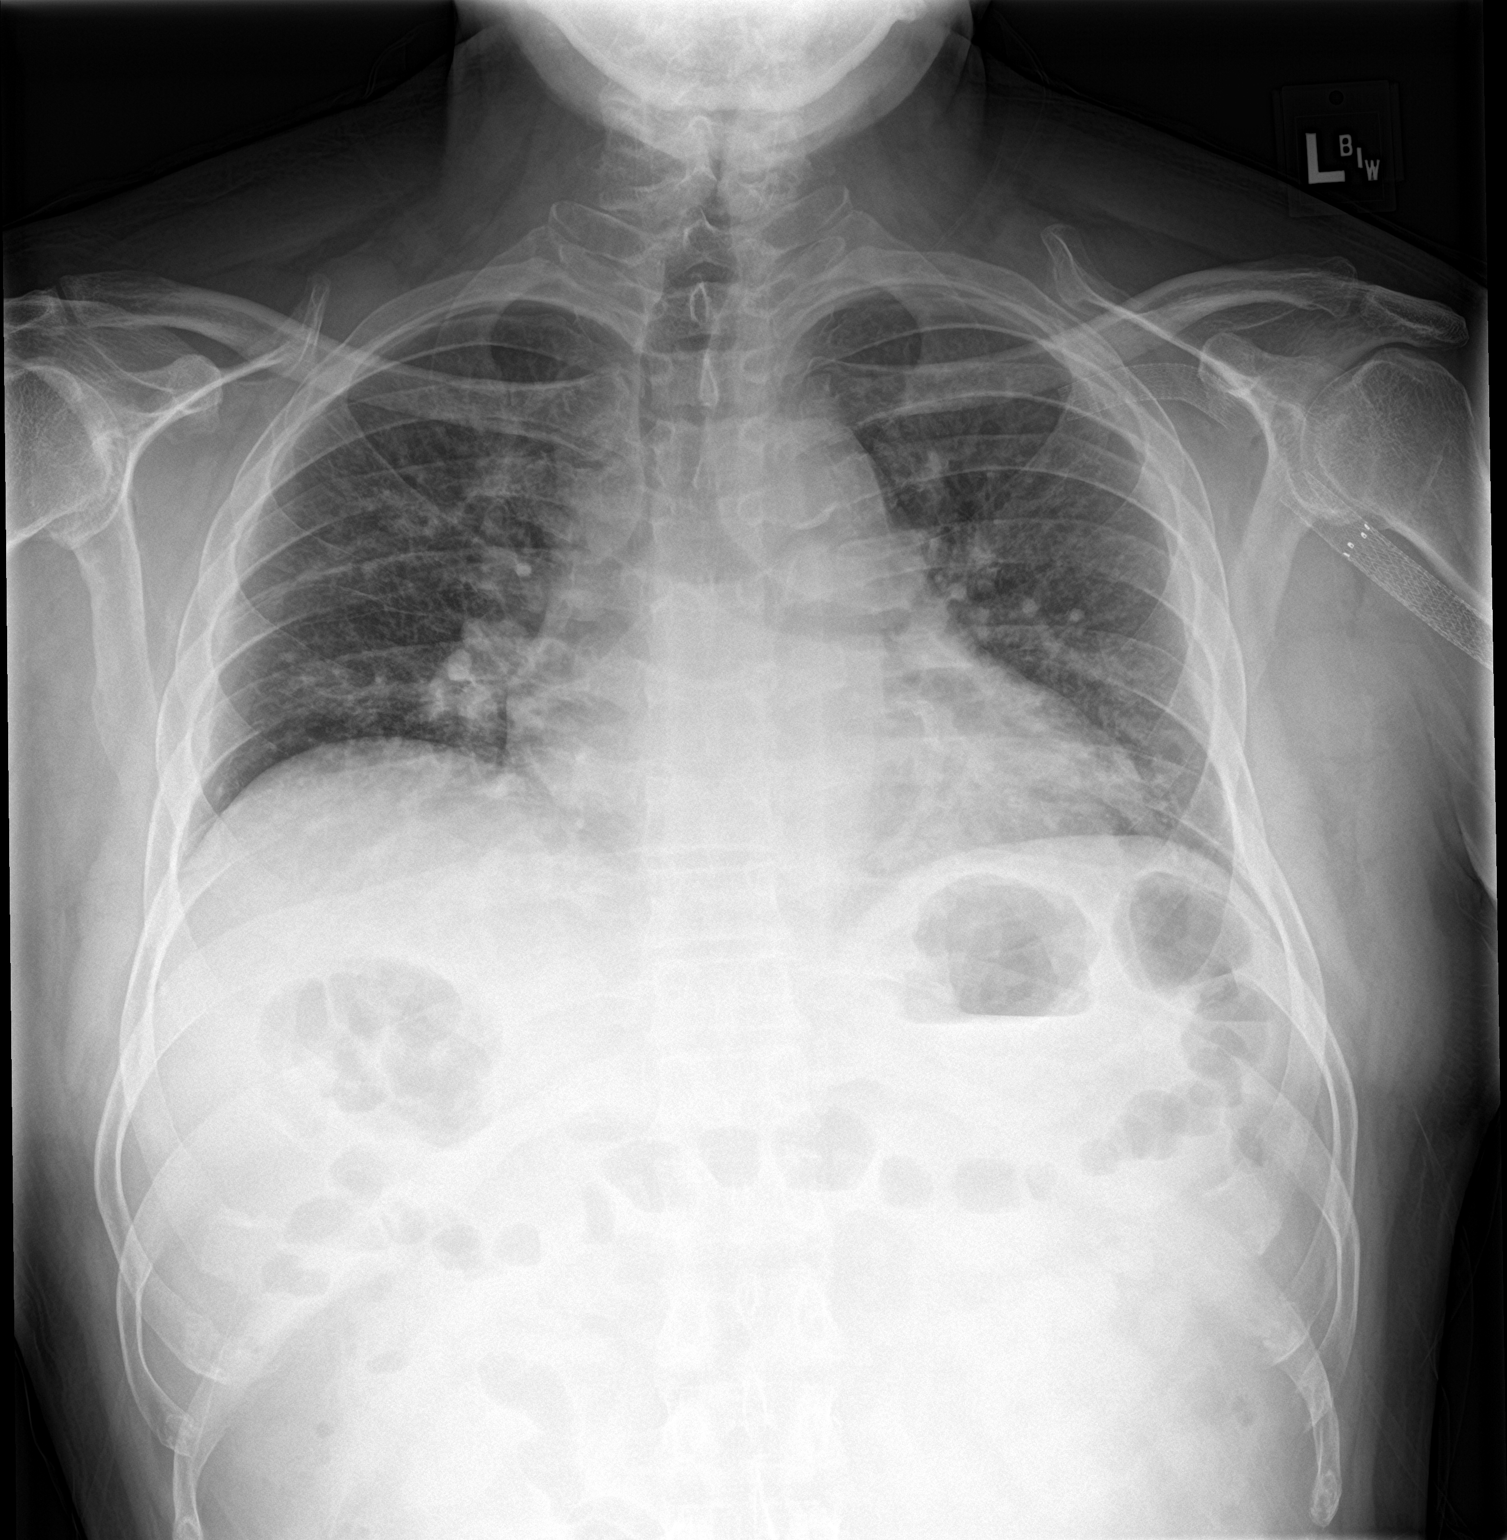

[chest lat]
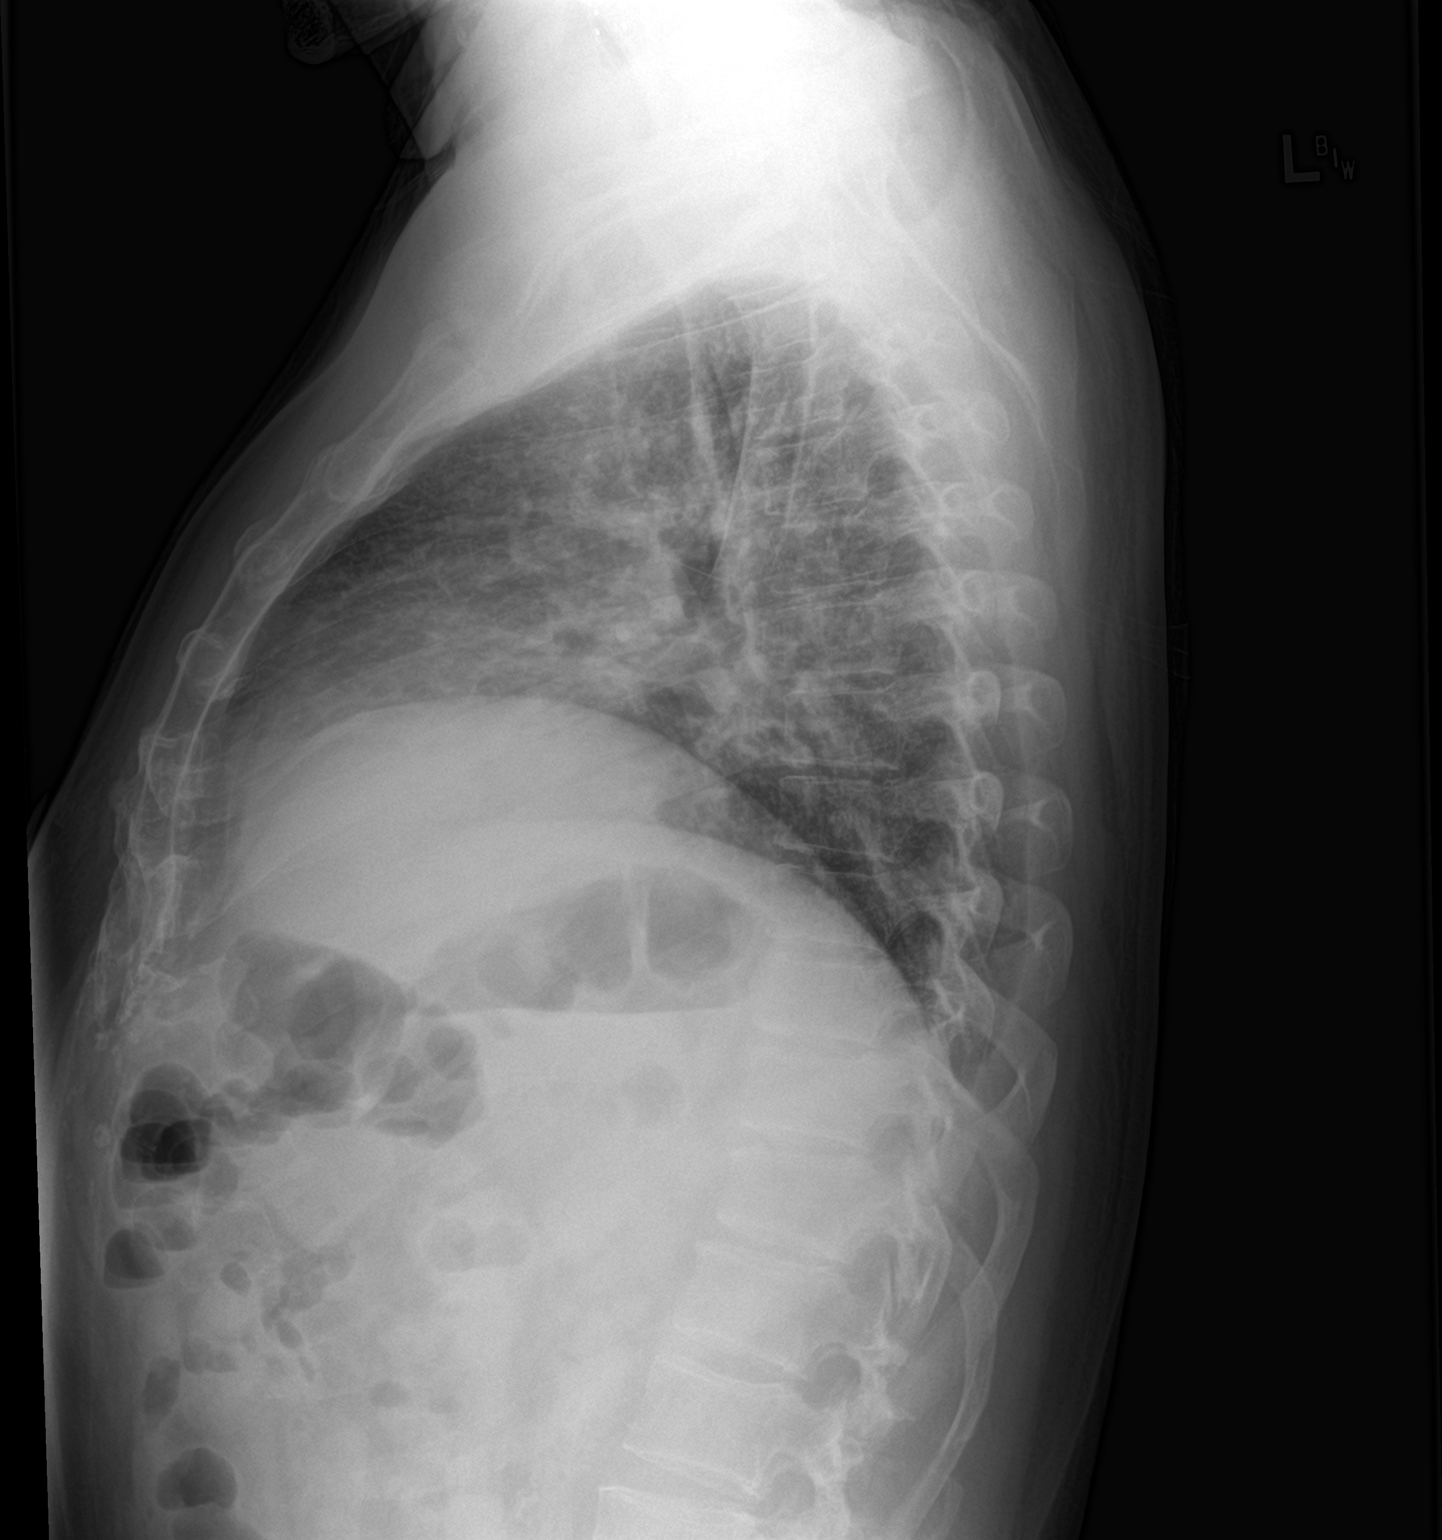

[2 of 2 positions shown; findings below may reference images not displayed]

FINDINGS: Low lung volumes with bibasilar atelectasis. Heart size is
accentuated by the low volumes, borderline. Mild vascular
congestion. No acute bony abnormality.
IMPRESSION: Low lung volumes with bibasilar atelectasis and vascular congestion.

## 2018-12-09 ENCOUNTER — Telehealth: Payer: Self-pay | Admitting: Family Medicine

## 2018-12-09 NOTE — Telephone Encounter (Signed)
Received physical therapy orders to sign for patient at [facility]. Has not been seen here since Nov 2018. Will sign orders as he is technically still an established patient in our office (within 3 years) but [facility] blue team, please contact patient and ask him to schedule follow up with his new PCP for his diabetes. Thanks Leeanne Rio, MD

## 2018-12-09 NOTE — Telephone Encounter (Signed)
LM for patient on daughter Carmen's cell.  Will mail a letter also.  Jazmin Hartsell,CMA

## 2018-12-23 ENCOUNTER — Encounter (HOSPITAL_COMMUNITY): Payer: Medicare Other

## 2018-12-23 ENCOUNTER — Other Ambulatory Visit: Payer: Self-pay

## 2018-12-23 DIAGNOSIS — N186 End stage renal disease: Secondary | ICD-10-CM

## 2018-12-23 DIAGNOSIS — Z992 Dependence on renal dialysis: Secondary | ICD-10-CM

## 2018-12-27 ENCOUNTER — Ambulatory Visit (INDEPENDENT_AMBULATORY_CARE_PROVIDER_SITE_OTHER): Payer: Self-pay | Admitting: Family

## 2018-12-27 ENCOUNTER — Ambulatory Visit (HOSPITAL_COMMUNITY)
Admission: RE | Admit: 2018-12-27 | Discharge: 2018-12-27 | Disposition: A | Discharge status: Home / Self Care | Payer: Medicare Other | Source: Ambulatory Visit | Attending: Family | Admitting: Family

## 2018-12-27 ENCOUNTER — Encounter: Payer: Self-pay | Admitting: Family

## 2018-12-27 ENCOUNTER — Other Ambulatory Visit: Payer: Self-pay

## 2018-12-27 VITALS — BP 158/75 | HR 73 | Temp 97.3°F | Resp 16 | Ht 63.0 in | Wt 161.0 lb

## 2018-12-27 DIAGNOSIS — Z992 Dependence on renal dialysis: Secondary | ICD-10-CM | POA: Diagnosis present

## 2018-12-27 DIAGNOSIS — N186 End stage renal disease: Secondary | ICD-10-CM | POA: Insufficient documentation

## 2018-12-27 DIAGNOSIS — I770 Arteriovenous fistula, acquired: Secondary | ICD-10-CM

## 2018-12-27 NOTE — Progress Notes (Addendum)
CC: duplex follow up of Ligation of side branches right arm AV fistula  History of Present Illness  Stephen Lane is a 58 y.o. (1960/07/28) male who is s/p Ligation of side branches right arm AV fistula, ultrasound right arm AV fistula on 10-18-18 by Dr. Darrick Penna for oorly maturing right arm AV fistula.   He returns today for 6 weeks follow up and duplex.   He denies any steal type sx's in his right upper extremity.   He is also s/p right BC AVF by Dr. Randie Heinz on 06/12/2018. He is on dialysis on T/T/S via right IJ TDC at the [facility] location.    He is under evaluation at [facility] for kidney transplant.   Bilateral carotid artery stenosis, last carotid duplex in 2014 (1-39% bilateral extracranial ICA stenosis), he has not had a CEA, has had a cardiac stent placed in 2013.  Stroke hx: none, no TIA  Pt states his nephrologist told him that his kidneys failed due to uncontrolled hypertension and uncontrolled DM.  He denies headache, denies dyspnea, denies chest pain.    Tobacco history: smoked 2.5 ppd x 10 years, quit [id number].   He takes 81 mg daily ASA, no other antiplatelet nor anticoagulant.    Past Medical History:  Diagnosis Date  . Carotid stenosis    a. Carotid US (05/2013):  1-39% bilateral ICA; brachial waveforms triphasic bilat; vertebrals with antegrade flow bilat; repeat 1 year  . Chronic high back pain    "where the lungs are located" (01/26/2015)  . Coronary atherosclerosis    a. admx with [place] 11/13 => LHC 04/15/12: pLAD 50%, oD1 75% (small), pCFX 40% followed by mCFX 95%, pRCA 60%, mRCA 90%, EF 55-65%. PCI 04/15/12: DES to [facility] and DES to the [facility]  . Daily headache   . ESRD (end stage renal disease) on dialysis (HCC)    "TTS; [facility]; [address] (07/18/2017)  . Essential hypertension   . Mixed hyperlipidemia   . Pneumonia 01/2015  . Type II diabetes mellitus (HCC)     Social History Social History   Tobacco Use  .  Smoking status: Former Smoker    Packs/day: 0.25    Years: 10.00    Pack years: 2.50    Types: Cigarettes  . Smokeless tobacco: Never Used  . Tobacco comment: "quit smoking cigarettes in the 1980's"  Substance Use Topics  . Alcohol use: Yes    Comment: "quit drinking in ~ 2010"  . Drug use: No    Family History Family History  Problem Relation Age of Onset  . Hypertension Mother        died 54 years old unknown reasons  . Other Father        died when Mekhi was 104 years old from "infection"  . Diabetes Brother   . Hypertension Sister     Surgical History Past Surgical History:  Procedure Laterality Date  . A/V FISTULAGRAM N/A 10/11/2018   Procedure: A/V FISTULAGRAM - Right Arm;  Surgeon: Sherren Kerns, MD;  Location: [facility] INVASIVE CV LAB;  Service: Cardiovascular;  Laterality: N/A;  . AV FISTULA PLACEMENT Left 01/28/2015   Procedure: LEFT BRACHIAL-CEPHALIC ARTERIOVENOUS (AV) FISTULA CREATION;  Surgeon: Pryor Ochoa, MD;  Location: [facility] OR;  Service: Vascular;  Laterality: Left;  . AV FISTULA PLACEMENT Right 06/12/2018   Procedure: ARTERIOVENOUS (AV) FISTULA CREATION;  Surgeon: Maeola Harman, MD;  Location: [facility] OR;  Service: Vascular;  Laterality: Right;  .  CATARACT EXTRACTION W/ INTRAOCULAR LENS IMPLANT Left   . CORONARY ANGIOPLASTY WITH STENT PLACEMENT  04/15/2012  . EYE SURGERY Right 2007   "blood behind eyeball"  . INSERTION OF DIALYSIS CATHETER N/A 01/28/2015   Procedure: ULTRASOUND GUIDED INSERTION OF HEMODIALYSIS CATHETER RIGHT IJ;  Surgeon: Mal Misty, MD;  Location: [place];  Service: Vascular;  Laterality: N/A;  . LEFT HEART CATHETERIZATION WITH CORONARY ANGIOGRAM N/A 04/15/2012   Procedure: LEFT HEART CATHETERIZATION WITH CORONARY ANGIOGRAM;  Surgeon: Larey Dresser, MD;  Location: [facility] CATH LAB;  Service: Cardiovascular;  Laterality: N/A;  . LIGATION OF COMPETING BRANCHES OF ARTERIOVENOUS FISTULA Left 03/10/2015   Procedure: LIGATION OF COMPETING  BRANCHES OF LEFT ARM BRACHIOCEPHALIC ARTERIOVENOUS FISTULA;  Surgeon: Mal Misty, MD;  Location: [place];  Service: Vascular;  Laterality: Left;  . LIGATION OF COMPETING BRANCHES OF ARTERIOVENOUS FISTULA Right 10/18/2018   Procedure: SIDE BRANCH LIGATION OF ARTERIOVENOUS FISTULA RIGHT ARM;  Surgeon: Elam Dutch, MD;  Location: [place];  Service: Vascular;  Laterality: Right;  . PERCUTANEOUS CORONARY STENT INTERVENTION (PCI-S) N/A 04/15/2012   Procedure: PERCUTANEOUS CORONARY STENT INTERVENTION (PCI-S);  Surgeon: Sherren Mocha, MD;  Location: [facility] CATH LAB;  Service: Cardiovascular;  Laterality: N/A;  . PERIPHERAL VASCULAR BALLOON ANGIOPLASTY  10/11/2018   Procedure: PERIPHERAL VASCULAR BALLOON ANGIOPLASTY;  Surgeon: Elam Dutch, MD;  Location: [place] CV LAB;  Service: Cardiovascular;;  Right arm fistula    No Known Allergies  Current Outpatient Medications  Medication Sig Dispense Refill  . amLODipine (NORVASC) 10 MG tablet Take 10 mg by mouth at bedtime.    Marland Kitchen aspirin EC 81 MG tablet Take 1 tablet (81 mg total) by mouth daily. 90 tablet 3  . calcium acetate (PHOSLO) 667 MG capsule Take 3 capsules (2,001 mg total) by mouth 3 (three) times daily with meals. 30 capsule 3  . ethyl chloride spray Apply 1 application topically daily as needed (prior to port being accessed).     . losartan (COZAAR) 50 MG tablet Take 50 mg by mouth every evening.    . VELPHORO 500 MG chewable tablet Chew 500 mg by mouth 3 (three) times daily with meals.     Marland Kitchen oxyCODONE-acetaminophen (PERCOCET/ROXICET) 5-325 MG tablet Take 1 tablet by mouth every 6 (six) hours as needed. (Patient not taking: Reported on 12/27/2018) 6 tablet 0   No current facility-administered medications for this visit.      REVIEW OF SYSTEMS: see HPI for pertinent positives and negatives    PHYSICAL EXAMINATION:  Vitals:   12/27/18 1045  BP: (!) 158/75  Pulse: 73  Resp: 16  Temp: (!) 97.3 F (36.3 C)  TempSrc: Temporal   SpO2: 100%  Weight: 161 lb (73 kg)  Height: $Remove'5\' 3"'[id number]$  (1.6 m)   Body mass index is 28.52 kg/m.  General: The patient appears his stated age.   HEENT:  No gross abnormalities Pulmonary: Respirations are non-labored Abdomen: Soft and non-tender  Musculoskeletal: There are no major deformities.   Neurologic: No focal weakness or paresthesias are detected Skin: There are no ulcer or rashes noted. Psychiatric: The patient has normal affect. Cardiovascular: There is a regular rate and rhythm Right upper arm AVF with palpable thrill and audible bruit. Faint right radial pulse palpable.  Right IJ TDC in place Bilateral hand grip strength is 5/5.    Non-Invasive Vascular Imaging  Right arm Access Duplex  (Date: 12/27/2018):  Findings: +--------------------+----------+-----------------+--------+ AVF  PSV (cm/s)Flow Vol (mL/min)Comments +--------------------+----------+-----------------+--------+ Native artery inflow   210          2246                +--------------------+----------+-----------------+--------+ AVF Anastomosis        403                              +--------------------+----------+-----------------+--------+   +------------+----------+-------------+----------+----------------+ OUTFLOW VEINPSV (cm/s)Diameter (cm)Depth (cm)    Describe     +------------+----------+-------------+----------+----------------+ Clavicle                                          patent      +------------+----------+-------------+----------+----------------+ Shoulder       114                                            +------------+----------+-------------+----------+----------------+ Prox UA        123        0.78        0.41   competing branch +------------+----------+-------------+----------+----------------+ Mid UA         142        0.67        0.35                     +------------+----------+-------------+----------+----------------+ Dist UA        244        0.65        0.34                    +------------+----------+-------------+----------+----------------+ AC Fossa       305        0.78        0.47                    +------------+----------+-------------+----------+----------------+ Summary: Patent arteriovenous fistula.  No apparent restenosis of proximal subclavian vein.    Medical Decision Making  Brenda Tad Fancher is a 58 y.o. male who is s/p ligation of side branches right arm AV fistula, ultrasound right arm AV fistula on 10-18-18 by Dr. Oneida Alar for oorly maturing right arm AV fistula.   He returns today for 6 weeks follow up and duplex.   He denies any steal type sx's in his right upper extremity.   He is also s/p right BC AVF by Dr. Donzetta Matters on 06/12/2018. He is on dialysis on T/T/S via right IJ TDC at the [facility] location.    I discussed with Dr. Donzetta Matters.  Duplex of right arma AVF shows shallow enough depths and wide enough diameters, may access ASAP for HD.   Follow up with [place] as needed.    Clemon Chambers, RN, MSN, FNP-C [facility] [place] Office: [phone]  12/27/2018, 11:16 AM  Clinic MD: Donzetta Matters

## 2019-03-18 ENCOUNTER — Other Ambulatory Visit: Payer: Self-pay

## 2019-03-18 ENCOUNTER — Ambulatory Visit (INDEPENDENT_AMBULATORY_CARE_PROVIDER_SITE_OTHER): Payer: Medicare Other | Admitting: Podiatry

## 2019-03-18 ENCOUNTER — Encounter: Payer: Self-pay | Admitting: Podiatry

## 2019-03-18 VITALS — BP 160/68

## 2019-03-18 DIAGNOSIS — B351 Tinea unguium: Secondary | ICD-10-CM | POA: Diagnosis not present

## 2019-03-18 DIAGNOSIS — E1122 Type 2 diabetes mellitus with diabetic chronic kidney disease: Secondary | ICD-10-CM | POA: Diagnosis not present

## 2019-03-18 DIAGNOSIS — M79674 Pain in right toe(s): Secondary | ICD-10-CM

## 2019-03-18 DIAGNOSIS — M79675 Pain in left toe(s): Secondary | ICD-10-CM | POA: Diagnosis not present

## 2019-03-18 DIAGNOSIS — Q828 Other specified congenital malformations of skin: Secondary | ICD-10-CM

## 2019-03-19 ENCOUNTER — Encounter: Payer: Self-pay | Admitting: Podiatry

## 2019-03-19 NOTE — Progress Notes (Signed)
  Subjective:  Patient ID: Stephen Lane, male    DOB: 07/07/1960,  MRN: [id number]  Chief Complaint  Patient presents with  . Foot Pain    pt is here for routine foot care, pt states that he is ona  kidney transplant program, and is looking to get his feet checked, pt is a type 2 diabetic, with a bs that was taken last week at 38   58 y.o. male returns for the above complaint.   Objective:   Vitals:   03/18/19 1448  BP: (!) 160/68   Podiatric Exam: Vascular: dorsalis pedis and posterior tibial pulses are palpable bilateral. Capillary return is immediate. Temperature gradient is WNL. Skin turgor WNL  Sensorium: Normal Semmes Weinstein monofilament test. Normal tactile sensation bilaterally. Nail Exam: Pt has thick disfigured discolored nails with subungual debris noted bilateral entire nail hallux through fifth toenails Ulcer Exam: There is no evidence of ulcer or pre-ulcerative changes or infection.  Hyperkeratotic lesion noted at the IPJ of the hallux bilaterally.  Pain on palpation to the lesions. Orthopedic Exam: Muscle tone and strength are WNL. No limitations in general ROM. No crepitus or effusions noted. HAV  B/L.  Hammer toes 2-5  B/L. Skin: No Porokeratosis. No infection or ulcers  Assessment & Plan:  Patient was evaluated and treated and all questions answered.  Bilateral porokeratosis of the hallux IPJ -Using a chisel blade, the lesions were debrided down to healthy striated tissue.  No complications noted.    Onychomycosis with pain  -Nails palliatively debrided as below. -Educated on self-care  Procedure: Nail Debridement Rationale: pain  Type of Debridement: manual, sharp debridement. Instrumentation: Nail nipper, rotary burr. Number of Nails: 10  Procedures and Treatment: Consent by patient was obtained for treatment procedures. The patient understood the discussion of treatment and procedures well. All questions were answered thoroughly reviewed.  Debridement of mycotic and hypertrophic toenails, 1 through 5 bilateral and clearing of subungual debris. No ulceration, no infection noted.  Return Visit-Office Procedure: Patient instructed to return to the office for a follow up visit 3 months for continued evaluation and treatment.  Boneta Lucks, DPM    No follow-ups on file.

## 2019-04-11 ENCOUNTER — Other Ambulatory Visit: Payer: Self-pay

## 2019-04-11 DIAGNOSIS — Z20822 Contact with and (suspected) exposure to covid-19: Secondary | ICD-10-CM

## 2019-04-12 LAB — NOVEL CORONAVIRUS, NAA: SARS-CoV-2, NAA: NOT DETECTED

## 2019-04-28 ENCOUNTER — Encounter: Payer: Self-pay | Admitting: Gastroenterology

## 2019-05-21 ENCOUNTER — Other Ambulatory Visit: Payer: Self-pay

## 2019-05-21 ENCOUNTER — Ambulatory Visit (AMBULATORY_SURGERY_CENTER): Payer: Medicare Other | Admitting: *Deleted

## 2019-05-21 VITALS — Temp 96.9°F | Ht 63.0 in | Wt 166.0 lb

## 2019-05-21 DIAGNOSIS — Z1159 Encounter for screening for other viral diseases: Secondary | ICD-10-CM

## 2019-05-21 DIAGNOSIS — Z1211 Encounter for screening for malignant neoplasm of colon: Secondary | ICD-10-CM

## 2019-05-21 MED ORDER — NA SULFATE-K SULFATE-MG SULF 17.5-3.13-1.6 GM/177ML PO SOLN: 1.0000 | Freq: Once | ORAL | 0 refills | Status: AC

## 2019-05-21 NOTE — Progress Notes (Signed)

## 2019-05-21 NOTE — Progress Notes (Signed)
Patient's daughter, Verdis Frederickson, and interpretor Milly Lowdermilk, accompanied patient in pre-visit appointment. Patient verbalized understanding and all questions answered.

## 2019-06-02 ENCOUNTER — Ambulatory Visit (INDEPENDENT_AMBULATORY_CARE_PROVIDER_SITE_OTHER): Payer: Medicare Other

## 2019-06-02 ENCOUNTER — Other Ambulatory Visit: Payer: Self-pay | Admitting: Gastroenterology

## 2019-06-02 DIAGNOSIS — Z1159 Encounter for screening for other viral diseases: Secondary | ICD-10-CM

## 2019-06-02 LAB — SARS CORONAVIRUS 2 (TAT 6-24 HRS): SARS Coronavirus 2: NEGATIVE

## 2019-06-04 ENCOUNTER — Other Ambulatory Visit: Payer: Self-pay

## 2019-06-04 ENCOUNTER — Encounter: Payer: Self-pay | Admitting: Gastroenterology

## 2019-06-04 ENCOUNTER — Ambulatory Visit (AMBULATORY_SURGERY_CENTER): Payer: Medicare Other | Admitting: Gastroenterology

## 2019-06-04 VITALS — BP 175/56 | HR 72 | Temp 98.3°F | Resp 16 | Ht 63.0 in | Wt 166.0 lb

## 2019-06-04 DIAGNOSIS — D123 Benign neoplasm of transverse colon: Secondary | ICD-10-CM | POA: Diagnosis not present

## 2019-06-04 DIAGNOSIS — Z1211 Encounter for screening for malignant neoplasm of colon: Secondary | ICD-10-CM | POA: Diagnosis not present

## 2019-06-04 MED ORDER — SODIUM CHLORIDE 0.9 % IV SOLN: 500.0000 mL | Freq: Once | INTRAVENOUS | Status: DC

## 2019-06-04 NOTE — Progress Notes (Signed)
Called to room to assist during endoscopic procedure.  Patient ID and intended procedure confirmed with present staff. Received instructions for my participation in the procedure from the performing physician.  

## 2019-06-04 NOTE — Op Note (Signed)
[facility] Patient Name: Stephen Lane Procedure Date: 06/04/2019 11:19 AM MRN: [id number] Endoscopist: Remo Lipps P. Havery Stephen Lane , MD Age: 58 Referring MD:  Date of Birth: November 06, 1960 Gender: Male Account #: [id number] Procedure:                Colonoscopy Indications:              Screening for colorectal malignant neoplasm, This                            is the patient's first colonoscopy Medicines:                Monitored Anesthesia Care Procedure:                Pre-Anesthesia Assessment:                           - Prior to the procedure, a History and Physical                            was performed, and patient medications and                            allergies were reviewed. The patient's tolerance of                            previous anesthesia was also reviewed. The risks                            and benefits of the procedure and the sedation                            options and risks were discussed with the patient.                            All questions were answered, and informed consent                            was obtained. Prior Anticoagulants: The patient has                            taken no previous anticoagulant or antiplatelet                            agents. ASA Grade Assessment: III - A patient with                            severe systemic disease. After reviewing the risks                            and benefits, the patient was deemed in                            satisfactory condition to undergo the procedure.  After obtaining informed consent, the colonoscope                            was passed under direct vision. Throughout the                            procedure, the patient's blood pressure, pulse, and                            oxygen saturations were monitored continuously. The                            Colonoscope was introduced through the anus and                            advanced  to the the cecum, identified by                            appendiceal orifice and ileocecal valve. The                            colonoscopy was performed without difficulty. The                            patient tolerated the procedure well. The quality                            of the bowel preparation was good. The ileocecal                            valve, appendiceal orifice, and rectum were                            photographed. Scope In: 11:31:51 AM Scope Out: 11:50:53 AM Scope Withdrawal Time: 0 hours 13 minutes 17 seconds  Total Procedure Duration: 0 hours 19 minutes 2 seconds  Findings:                 The perianal and digital rectal examinations were                            normal.                           A few small-mouthed diverticula were found in the                            ascending colon.                           A 3 mm polyp was found in the transverse colon. The                            polyp was sessile. The polyp was removed with a  cold snare. Resection and retrieval were complete.                           Small internal hemorrhoids were found during                            retroflexion.                           The exam was otherwise without abnormality. Complications:            No immediate complications. Estimated blood loss:                            Minimal. Estimated Blood Loss:     Estimated blood loss was minimal. Impression:               - Diverticulosis in the ascending colon.                           - One 3 mm polyp in the transverse colon, removed                            with a cold snare. Resected and retrieved.                           - Internal hemorrhoids.                           - The examination was otherwise normal. Recommendation:           - Patient has a contact number available for                            emergencies. The signs and symptoms of potential                             delayed complications were discussed with the                            patient. Return to normal activities tomorrow.                            Written discharge instructions were provided to the                            patient.                           - Resume previous diet.                           - Continue present medications.                           - Await pathology results. Remo Lipps P. Stephen Eggert, MD 06/04/2019 11:54:11 AM This report has been signed electronically.

## 2019-06-04 NOTE — Progress Notes (Signed)
Report to PACU, RN, vss, BBS= Clear.  

## 2019-06-04 NOTE — Patient Instructions (Signed)
Handouts provided on Las hemorroides, polipos del colon y cancer del colon, and La diverticulosis.  USTED TUVO UN PROCEDIMIENTO ENDOSCPICO HOY EN EL [facility]:   Lea el informe del procedimiento que se le entreg para cualquier pregunta especfica sobre lo que se Primary school teacher.  Si el informe del examen no responde a sus preguntas, por favor llame a su gastroenterlogo para aclararlo.  Si usted solicit que no se le den [employer] de lo que se Estate manager/land agent en su procedimiento al [employer] va a cuidar, entonces el informe del procedimiento se ha incluido en un sobre sellado para que usted lo revise despus cuando le sea ms conveniente.   LO QUE PUEDE ESPERAR: Algunas sensaciones de hinchazn en el abdomen.  Puede tener ms gases de lo normal.  El caminar puede ayudarle a eliminar el aire que se le puso en el tracto gastrointestinal durante el procedimiento y reducir la hinchazn.  Si le hicieron una endoscopia inferior (como una colonoscopia o una sigmoidoscopia flexible), podra notar manchas de sangre en las heces fecales o en el papel higinico.  Si se someti a una preparacin intestinal para su procedimiento, es posible que no tenga una evacuacin intestinal normal durante [employer].   Tenga en cuenta:  Es posible que note un poco de irritacin y congestin en la nariz o algn drenaje.  Esto es debido al oxgeno [employer] durante su procedimiento.  No hay que preocuparse y esto debe desaparecer ms o Scientist, research (medical).   SNTOMAS PARA REPORTAR INMEDIATAMENTE:  Despus de una endoscopia inferior (colonoscopia o sigmoidoscopia flexible):  Cantidades excesivas de sangre en las heces fecales  Sensibilidad significativa o empeoramiento de los dolores abdominales   Hinchazn aguda del abdomen que antes no tena   Fiebre de 100F o ms   Despus de la endoscopia superior (EGD)  Vmitos de Biochemist, clinical o material como caf molido   Dolor en el pecho o dolor debajo de los  omplatos que antes no tena   Dolor o dificultad persistente para tragar  Falta de aire que antes no tena   Fiebre de 100F o ms  Heces fecales negras y pegajosas   Para asuntos urgentes o de Freight forwarder, puede comunicarse con un gastroenterlogo a cualquier hora llamando al [phone].  DIETA:  Recomendamos una comida pequea al principio, pero luego puede continuar con su dieta normal.  Tome muchos lquidos, Teacher, adult education las bebidas alcohlicas durante 24 horas.    ACTIVIDAD:  Debe planear tomarse las cosas con calma por el resto del da y no debe CONDUCIR ni usar maquinaria pesada Programmer, applications (debido a los medicamentos de sedacin utilizados durante el examen).     SEGUIMIENTO: Nuestro personal llamar al nmero que aparece en su historial al siguiente da hbil de su procedimiento para ver cmo se siente y para responder cualquier pregunta o inquietud que pueda tener con respecto a la informacin que se le dio despus del procedimiento. Si no podemos contactarle, le dejaremos un mensaje.  Sin embargo, si se siente bien y no tiene Paediatric nurse, no es necesario que nos devuelva la llamada.  Asumiremos que ha regresado a sus actividades diarias normales sin incidentes. Si se le tomaron algunas biopsias, le contactaremos por telfono o por carta en las prximas 3 semanas.  Si no ha sabido [employer] biopsias en el transcurso de 3 semanas, por favor llmenos al [phone].   FIRMAS/CONFIDENCIALIDAD: Usted y/o el acompaante que le cuide [place]  firmado documentos que se ingresarn en su historial mdico electrnico.  Estas firmas atestiguan el hecho de que la informacin anterior

## 2019-06-04 NOTE — Progress Notes (Signed)
Temp  JB  VS  CW  Pt's states no medical or surgical changes since previsit or office visit.  Interpreter used today at the [facility] for this pt.  Interpreter's name is-Mark Lemar Livings

## 2019-06-09 ENCOUNTER — Telehealth: Payer: Self-pay | Admitting: *Deleted

## 2019-06-09 NOTE — Telephone Encounter (Signed)
  Follow up Call-  Call back number 06/04/2019  Post procedure Call Back phone  # [phone]  [phone] [name]) - he will be with her  Permission to leave phone message Yes  Some recent data might be hidden     Patient questions:  Do you have a fever, pain , or abdominal swelling? No. Pain Score  0 *  Have you tolerated food without any problems? Yes.    Have you been able to return to your normal activities? Yes.    Do you have any questions about your discharge instructions: Diet   No. Medications  No. Follow up visit  No.  Do you have questions or concerns about your Care? No.  Actions: * If pain score is 4 or above: No action needed, pain <4.  1. Have you developed a fever since your procedure? no  2.   Have you had an respiratory symptoms (SOB or cough) since your procedure? no  3.   Have you tested positive for COVID 19 since your procedure? no  4.   Have you had any family members/close contacts diagnosed with the COVID 19 since your procedure?  no   If yes to any of these questions please route to Joylene John, RN and Alphonsa Gin, Therapist, sports.

## 2019-06-10 ENCOUNTER — Encounter: Payer: Self-pay | Admitting: Gastroenterology

## 2019-06-18 ENCOUNTER — Ambulatory Visit: Payer: Medicare Other | Admitting: Podiatry

## 2019-06-20 ENCOUNTER — Ambulatory Visit (INDEPENDENT_AMBULATORY_CARE_PROVIDER_SITE_OTHER): Payer: Medicare Other | Admitting: Podiatry

## 2019-06-20 ENCOUNTER — Other Ambulatory Visit: Payer: Self-pay

## 2019-06-20 ENCOUNTER — Encounter: Payer: Self-pay | Admitting: Podiatry

## 2019-06-20 DIAGNOSIS — M79674 Pain in right toe(s): Secondary | ICD-10-CM | POA: Diagnosis not present

## 2019-06-20 DIAGNOSIS — E1122 Type 2 diabetes mellitus with diabetic chronic kidney disease: Secondary | ICD-10-CM

## 2019-06-20 DIAGNOSIS — B351 Tinea unguium: Secondary | ICD-10-CM | POA: Diagnosis not present

## 2019-06-20 DIAGNOSIS — M79675 Pain in left toe(s): Secondary | ICD-10-CM | POA: Diagnosis not present

## 2019-06-20 NOTE — Progress Notes (Signed)
  Subjective:  Patient ID: Stephen Lane, male    DOB: Jun 23, 1960,  MRN: [id number]  Chief Complaint  Patient presents with  . Nail Problem    pt is here for routine foot care, pt is also a type 2 diabetic   59 y.o. male returns for the above complaint.  Patient states his toenails are painful thickened elongated mycotic and has not been able to trim them down himself.  He would like to know if we can do for him.  He states it is painful when ambulating.  He denies any other acute complaints.  States states that is type II diabetic.  And his sugars are well controlled.  He does not know his last A1c.  Objective:   There were no vitals filed for this visit. Podiatric Exam: Vascular: dorsalis pedis and posterior tibial pulses are palpable bilateral. Capillary return is immediate. Temperature gradient is WNL. Skin turgor WNL  Sensorium: Normal Semmes Weinstein monofilament test. Normal tactile sensation bilaterally. Nail Exam: Pt has thick disfigured discolored nails with subungual debris noted bilateral entire nail hallux through fifth toenails Ulcer Exam: There is no evidence of ulcer or pre-ulcerative changes or infection.  Hyperkeratotic lesion noted at the IPJ of the hallux bilaterally.  Pain on palpation to the lesions. Orthopedic Exam: Muscle tone and strength are WNL. No limitations in general ROM. No crepitus or effusions noted. HAV  B/L.  Hammer toes 2-5  B/L. Skin: No Porokeratosis. No infection or ulcers  Assessment & Plan:  Patient was evaluated and treated and all questions answered.  Onychomycosis with pain  -Nails palliatively debrided as below. -Educated on self-care  Procedure: Nail Debridement Rationale: pain  Type of Debridement: manual, sharp debridement. Instrumentation: Nail nipper, rotary burr. Number of Nails: 10  Procedures and Treatment: Consent by patient was obtained for treatment procedures. The patient understood the discussion of treatment and  procedures well. All questions were answered thoroughly reviewed. Debridement of mycotic and hypertrophic toenails, 1 through 5 bilateral and clearing of subungual debris. No ulceration, no infection noted.  Return Visit-Office Procedure: Patient instructed to return to the office for a follow up visit 3 months for continued evaluation and treatment.  Boneta Lucks, DPM    No follow-ups on file.

## 2019-09-19 ENCOUNTER — Other Ambulatory Visit: Payer: Self-pay

## 2019-09-19 ENCOUNTER — Ambulatory Visit (INDEPENDENT_AMBULATORY_CARE_PROVIDER_SITE_OTHER): Payer: Medicare Other | Admitting: Podiatry

## 2019-09-19 ENCOUNTER — Encounter: Payer: Self-pay | Admitting: Podiatry

## 2019-09-19 VITALS — Temp 97.7°F

## 2019-09-19 DIAGNOSIS — E1122 Type 2 diabetes mellitus with diabetic chronic kidney disease: Secondary | ICD-10-CM

## 2019-09-19 DIAGNOSIS — M79674 Pain in right toe(s): Secondary | ICD-10-CM | POA: Diagnosis not present

## 2019-09-19 DIAGNOSIS — M79675 Pain in left toe(s): Secondary | ICD-10-CM

## 2019-09-19 DIAGNOSIS — Z992 Dependence on renal dialysis: Secondary | ICD-10-CM

## 2019-09-19 DIAGNOSIS — B351 Tinea unguium: Secondary | ICD-10-CM

## 2019-09-19 DIAGNOSIS — N186 End stage renal disease: Secondary | ICD-10-CM

## 2019-09-19 NOTE — Progress Notes (Signed)
This patient returns to my office for at risk foot care.  This patient requires this care by a professional since this patient will be at risk due to having type 2 diabetes and ESRD.  This patient is unable to cut nails himself since the patient cannot reach his nails.These nails are painful walking and wearing shoes.  This patient presents for at risk foot care today.  General Appearance  Alert, conversant and in no acute stress.  Vascular  Dorsalis pedis and posterior tibial  pulses are palpable  bilaterally.  Capillary return is within normal limits  bilaterally. Temperature is within normal limits  bilaterally.  Neurologic  Senn-Weinstein monofilament wire test within normal limits  bilaterally. Muscle power within normal limits bilaterally.  Nails Thick disfigured discolored nails with subungual debris  from hallux to fifth toes bilaterally. No evidence of bacterial infection or drainage bilaterally.  Orthopedic  No limitations of motion  feet .  No crepitus or effusions noted.  No bony pathology or digital deformities noted.  HAV  B/L.  Skin  normotropic skin with no porokeratosis noted bilaterally.  No signs of infections or ulcers noted.     Onychomycosis  Pain in right toes  Pain in left toes  Consent was obtained for treatment procedures.   Mechanical debridement of nails 1-5  bilaterally performed with a nail nipper.  Filed with dremel without incident.    Return office visit    3 months                  Told patient to return for periodic foot care and evaluation due to potential at risk complications.   Gardiner Barefoot DPM

## 2019-12-19 ENCOUNTER — Ambulatory Visit: Payer: Medicare Other | Admitting: Podiatry

## 2020-03-09 ENCOUNTER — Encounter: Payer: Self-pay | Admitting: Podiatry

## 2020-03-09 ENCOUNTER — Other Ambulatory Visit: Payer: Self-pay

## 2020-03-09 ENCOUNTER — Ambulatory Visit (INDEPENDENT_AMBULATORY_CARE_PROVIDER_SITE_OTHER): Payer: Medicare Other | Admitting: Podiatry

## 2020-03-09 DIAGNOSIS — N186 End stage renal disease: Secondary | ICD-10-CM | POA: Diagnosis not present

## 2020-03-09 DIAGNOSIS — Z992 Dependence on renal dialysis: Secondary | ICD-10-CM

## 2020-03-09 DIAGNOSIS — B351 Tinea unguium: Secondary | ICD-10-CM | POA: Diagnosis not present

## 2020-03-09 DIAGNOSIS — M79675 Pain in left toe(s): Secondary | ICD-10-CM

## 2020-03-09 DIAGNOSIS — E1122 Type 2 diabetes mellitus with diabetic chronic kidney disease: Secondary | ICD-10-CM | POA: Diagnosis not present

## 2020-03-09 DIAGNOSIS — M79674 Pain in right toe(s): Secondary | ICD-10-CM

## 2020-03-09 NOTE — Progress Notes (Signed)
This patient returns to my office for at risk foot care.  This patient requires this care by a professional since this patient will be at risk due to having type 2 diabetes and ESRD.  This patient is unable to cut nails himself since the patient cannot reach his nails.These nails are painful walking and wearing shoes.  Patient is accompanied by an interpreter.  This patient presents for at risk foot care today.  General Appearance  Alert, conversant and in no acute stress.  Vascular  Dorsalis pedis and posterior tibial  pulses are palpable  bilaterally.  Capillary return is within normal limits  bilaterally. Temperature is within normal limits  bilaterally.  Neurologic  Senn-Weinstein monofilament wire test within normal limits  bilaterally. Muscle power within normal limits bilaterally.  Nails Thick disfigured discolored nails with subungual debris  from hallux to fifth toes bilaterally. No evidence of bacterial infection or drainage bilaterally.  Orthopedic  No limitations of motion  feet .  No crepitus or effusions noted.  No bony pathology or digital deformities noted.  HAV  B/L.  Skin  normotropic skin with no porokeratosis noted bilaterally.  No signs of infections or ulcers noted.     Onychomycosis  Pain in right toes  Pain in left toes  Consent was obtained for treatment procedures.   Mechanical debridement of nails 1-5  bilaterally performed with a nail nipper.  Filed with dremel without incident.    Return office visit    3 months                  Told patient to return for periodic foot care and evaluation due to potential at risk complications.   Gardiner Barefoot DPM

## 2020-09-08 ENCOUNTER — Ambulatory Visit: Payer: Medicare Other | Admitting: Podiatry

## 2022-02-27 ENCOUNTER — Encounter (HOSPITAL_BASED_OUTPATIENT_CLINIC_OR_DEPARTMENT_OTHER): Payer: Self-pay | Admitting: Cardiology

## 2022-02-27 ENCOUNTER — Ambulatory Visit (INDEPENDENT_AMBULATORY_CARE_PROVIDER_SITE_OTHER): Payer: Medicare Other | Admitting: Cardiology

## 2022-02-27 VITALS — BP 152/70 | HR 66 | Ht 63.0 in | Wt 160.0 lb

## 2022-02-27 DIAGNOSIS — Z955 Presence of coronary angioplasty implant and graft: Secondary | ICD-10-CM

## 2022-02-27 DIAGNOSIS — I959 Hypotension, unspecified: Secondary | ICD-10-CM | POA: Diagnosis not present

## 2022-02-27 DIAGNOSIS — I2510 Atherosclerotic heart disease of native coronary artery without angina pectoris: Secondary | ICD-10-CM

## 2022-02-27 DIAGNOSIS — R0789 Other chest pain: Secondary | ICD-10-CM

## 2022-02-27 DIAGNOSIS — Z940 Kidney transplant status: Secondary | ICD-10-CM

## 2022-02-27 NOTE — Patient Instructions (Signed)
Medication Instructions:  Your Physician recommend you continue on your current medication as directed.    *If you need a refill on your cardiac medications before your next appointment, please call your pharmacy*   Lab Work: None ordered today   Testing/Procedures: Your physician has requested that you have a stress echocardiogram. For further information please visit [email or web address]. Please follow instruction sheet as given. [place] [address]   Follow-Up: At [facility], you and your health needs are our priority.  As part of our continuing mission to provide you with exceptional heart care, we have created designated Provider Care Teams.  These Care Teams include your primary Cardiologist (physician) and Advanced Practice Providers (APPs -  Physician Assistants and Nurse Practitioners) who all work together to provide you with the care you need, when you need it.  We recommend signing up for the patient portal called "MyChart".  Sign up information is provided on this After Visit Summary.  MyChart is used to connect with patients for Virtual Visits (Telemedicine).  Patients are able to view lab/test results, encounter notes, upcoming appointments, etc.  Non-urgent messages can be sent to your provider as well.   To learn more about what you can do with MyChart, go to [email or web address].    Your next appointment:   1 month(s)  The format for your next appointment:   In Person  Provider:   Buford Dresser, MD      [facility] [address] [place], [place] [address] Phone:  [phone]        You are scheduled for an Exercise Stress ECHO   Please arrive 15 minutes prior to your appointment time for registration and insurance purposes.  The test will take approximately 45 minutes to complete.  How to prepare for your Exercise Stress Test: Do bring a list of your current  medications with you.  If not listed below, you may take your medications as normal. No tome carvedilol (Coreg) durante las 24 horas antes de la prueba. Traiga el medicamento a su cita, ya que es posible que tenga que tomarlo una vez que termine la prueba. Do wear comfortable clothes (no dresses or overalls) and walking shoes, tennis shoes preferred (no heels or open toed shoes are allowed) Do Not wear cologne, perfume, aftershave or lotions (deodorant is allowed). Please report to [place], [address] for your test.  If these instructions are not followed, your test will have to be rescheduled.  If you have questions or concerns about your appointment, you can call the Stress Lab at [phone].  If you cannot keep your appointment, please provide 24 hours notification to the Stress Lab, to avoid a possible $50 charge to your account

## 2022-02-27 NOTE — Progress Notes (Signed)
Cardiology Office Note:    Date:  02/27/2022   ID:  Freddrick Gladson, DOB 05/27/1961, MRN [id number]  PCP:  Patient, No Pcp Per  Cardiologist:  Buford Dresser, MD  Referring MD: Corliss Parish, MD   CC: new patient evaluation for hypotension  History of Present Illness:    Stephen Lane is a 61 y.o. male with a hx of carotid stenosis, coronary atherosclerosis, essential hypertension, mixed hyperlipidemia, ESRD, type 2 diabetes, who is seen as a new consult at the request of Corliss Parish, MD for the evaluation and management of hypotension.  Today:  He is accompanied by his daughter who translates and provides most of the history. He appears to be doing well.   He has been experiencing hypotension since back in June 2023, seeing numbers like 80/60s. He has only had one high reading since then, which was 947 systolic. Likely in relation to his low blood pressures, he has been feeling dizziness and weakness.  Additionally, he also frequently feels tired.  Lately, while sleeping at about 4 am, he has been feeling chest pressure like he is really thirsty. This wakes him up and forces him to get up and drink some water. After drinking water, however, his symptoms resolve.  He remains fairly active, especially outside the house. Lately, however, he has to stop more frequently to rest during activity. His low blood pressure normally occurs most frequently when he begins to perform activities. Sometimes he will also experience shortness of breath, which will resolve when he comes inside and drinks some water.  He denies any palpitations, chest pain, or peripheral edema. No headaches, syncope, orthopnea, or PND.  Past Medical History:  Diagnosis Date   Carotid stenosis    a. Carotid US (05/2013):  1-39% bilateral ICA; brachial waveforms triphasic bilat; vertebrals with antegrade flow bilat; repeat 1 year   Cataract    Chronic high back pain    "where the  lungs are located" (01/26/2015)   Coronary atherosclerosis    a. admx with [place] 11/13 => LHC 04/15/12: pLAD 50%, oD1 75% (small), pCFX 40% followed by mCFX 95%, pRCA 60%, mRCA 90%, EF 55-65%. PCI 04/15/12: DES to [facility] and DES to the [facility].   Daily headache    ESRD (end stage renal disease) on dialysis ([place])    "TTS; [facility]; [address] (07/18/2017)   Essential hypertension    Mixed hyperlipidemia    Pneumonia 01/2015   Type II diabetes mellitus [facility])     Past Surgical History:  Procedure Laterality Date   A/V FISTULAGRAM N/A 10/11/2018   Procedure: A/V FISTULAGRAM - Right Arm;  Surgeon: Elam Dutch, MD;  Location: [place] [facility];  Service: Cardiovascular;  Laterality: N/A;   AV FISTULA PLACEMENT Left 01/28/2015   Procedure: LEFT BRACHIAL-CEPHALIC ARTERIOVENOUS (AV) FISTULA CREATION;  Surgeon: Mal Misty, MD;  Location: [place];  Service: Vascular;  Laterality: Left;   AV FISTULA PLACEMENT Right 06/12/2018   Procedure: ARTERIOVENOUS (AV) FISTULA CREATION;  Surgeon: Waynetta Sandy, MD;  Location: [place];  Service: Vascular;  Laterality: Right;   CATARACT EXTRACTION W/ INTRAOCULAR LENS IMPLANT Left    CORONARY ANGIOPLASTY WITH STENT PLACEMENT  04/15/2012   EYE SURGERY Right 2007   "blood behind eyeball"   INSERTION OF DIALYSIS CATHETER N/A 01/28/2015   Procedure: ULTRASOUND GUIDED INSERTION OF HEMODIALYSIS CATHETER RIGHT IJ;  Surgeon: Mal Misty, MD;  Location: [place];  Service: Vascular;  Laterality: N/A;   LEFT HEART CATHETERIZATION  WITH CORONARY ANGIOGRAM N/A 04/15/2012   Procedure: LEFT HEART CATHETERIZATION WITH CORONARY ANGIOGRAM;  Surgeon: Larey Dresser, MD;  Location: [facility] CATH LAB;  Service: Cardiovascular;  Laterality: N/A;   LIGATION OF COMPETING BRANCHES OF ARTERIOVENOUS FISTULA Left 03/10/2015   Procedure: LIGATION OF COMPETING BRANCHES OF LEFT ARM BRACHIOCEPHALIC ARTERIOVENOUS FISTULA;  Surgeon: Mal Misty, MD;  Location: [place];  Service: Vascular;   Laterality: Left;   LIGATION OF COMPETING BRANCHES OF ARTERIOVENOUS FISTULA Right 10/18/2018   Procedure: SIDE BRANCH LIGATION OF ARTERIOVENOUS FISTULA RIGHT ARM;  Surgeon: Elam Dutch, MD;  Location: [place];  Service: Vascular;  Laterality: Right;   PERCUTANEOUS CORONARY STENT INTERVENTION (PCI-S) N/A 04/15/2012   Procedure: PERCUTANEOUS CORONARY STENT INTERVENTION (PCI-S);  Surgeon: Sherren Mocha, MD;  Location: [facility] CATH LAB;  Service: Cardiovascular;  Laterality: N/A;   PERIPHERAL VASCULAR BALLOON ANGIOPLASTY  10/11/2018   Procedure: PERIPHERAL VASCULAR BALLOON ANGIOPLASTY;  Surgeon: Elam Dutch, MD;  Location: [place] [facility];  Service: Cardiovascular;;  Right arm fistula    Current Medications: Current Outpatient Medications on File Prior to Visit  Medication Sig   Alcohol Swabs (ALCOHOL WIPES) 70 % PADS Use 1 swab as needed to clean site for blood sugar check.   amLODipine (NORVASC) 5 MG tablet Take 5 mg by mouth at bedtime.   aspirin EC 81 MG tablet Take 1 tablet (81 mg total) by mouth daily.   atorvastatin (LIPITOR) 40 MG tablet Take by mouth.   Blood Glucose Monitoring Suppl (GLUCOCOM BLOOD GLUCOSE MONITOR) DEVI Use meter to check blood sugar 2 times a day.  Please dispense based on availability, insurance coverage, and patient preference.   calcium acetate (PHOSLO) 667 MG capsule Take 3 capsules (2,001 mg total) by mouth 3 (three) times daily with meals.   dexamethasone (DECADRON) 6 MG tablet Take 6 mg by mouth once.   ethyl chloride spray Apply 1 application topically daily as needed (prior to port being accessed).    glipiZIDE (GLUCOTROL XL) 5 MG 24 hr tablet Take 5 mg by mouth daily.   glucose blood (PRECISION QID TEST) test strip Use 1 strip to check blood sugar 2 times a day. Please dispense based on availability, insurance coverage, and patient preference.   losartan (COZAAR) 100 MG tablet TK 1 T PO QHS   losartan (COZAAR) 50 MG tablet Take 50 mg by mouth every  evening.   meclizine (ANTIVERT) 25 MG tablet TK 1 T PO Q 6 H FOR 10 DAYS   methylPREDNISolone (MEDROL DOSEPAK) 4 MG TBPK tablet FPD   multivitamin (RENA-VIT) TABS tablet Take by mouth.   ofloxacin (OCUFLOX) 0.3 % ophthalmic solution    oxyCODONE-acetaminophen (PERCOCET/ROXICET) 5-325 MG tablet Take 1 tablet by mouth every 6 (six) hours as needed.   prednisoLONE acetate (PRED FORTE) 1 % ophthalmic suspension    sitaGLIPtin (JANUVIA) 50 MG tablet Take by mouth.   VELPHORO 500 MG chewable tablet Chew 500 mg by mouth 3 (three) times daily with meals.    No current facility-administered medications on file prior to visit.     Allergies:   Patient has no known allergies.   Social History   Tobacco Use   Smoking status: Former    Packs/day: 0.25    Years: 10.00    Total pack years: 2.50    Types: Cigarettes   Smokeless tobacco: Never   Tobacco comments:    "quit smoking cigarettes in the 1980's"  Vaping Use   Vaping Use: Never used  Substance Use Topics   Alcohol use: Yes    Comment: "quit drinking in ~ 2010"   Drug use: No    Family History: family history includes Diabetes in his brother; Hypertension in his mother and sister; Other in his father. There is no history of Colon cancer, Esophageal cancer, Stomach cancer, or Rectal cancer.  ROS:   Please see the history of present illness.   (+) Dizziness (+) Weakness  (+) Fatigue  Additional pertinent ROS otherwise negative.   EKGs/Labs/Other Studies Reviewed:    The following studies were reviewed today:  Echo 01/26/2015: Impressions:   - Hypokinesis of the distal lateral wall with overall low normal LV    function; grade 2 diastolic dysfunction with elevated LV filling    pressure; trace AI and MR; mild TR; moderately elevated pulmonary    pressure.   EKG:  EKG is personally reviewed.   02/27/22: sinus rhythm with first degree AV block at 66 bpm, q waves inferiorly  Recent Labs: No results found for requested labs  within last 365 days.  Recent Lipid Panel    Component Value Date/Time   CHOL 191 07/18/2017 0422   TRIG 215 (H) 07/18/2017 0422   HDL 38 (L) 07/18/2017 0422   CHOLHDL 5.0 07/18/2017 0422   VLDL 43 (H) 07/18/2017 0422   LDLCALC 110 (H) 07/18/2017 0422   LDLDIRECT 122 09/27/2015 1147    Physical Exam:    VS:  BP (!) 152/70   Pulse 66   Ht 5\' 3"  (1.6 m)   Wt 160 lb (72.6 kg)   BMI 28.34 kg/m     Wt Readings from Last 3 Encounters:  02/27/22 160 lb (72.6 kg)  06/04/19 166 lb (75.3 kg)  05/21/19 166 lb (75.3 kg)    GEN: Well nourished, well developed in no acute distress HEENT: Normal, moist mucous membranes NECK: No JVD CARDIAC: regular rhythm, normal S1 and S2, no rubs or gallops. No murmur. VASCULAR: Radial and DP pulses 2+ bilaterally. No carotid bruits RESPIRATORY:  Clear to auscultation without rales, wheezing or rhonchi  ABDOMEN: Soft, non-tender, non-distended MUSCULOSKELETAL:  Ambulates independently SKIN: Warm and dry, no edema NEUROLOGIC:  Alert and oriented x 3. No focal neuro deficits noted. PSYCHIATRIC:  Normal affect    ASSESSMENT:    1. Hypotension, unspecified hypotension type   2. Chest pressure   3. Atherosclerosis of native coronary artery of native heart without angina pectoris   4. History of coronary angioplasty with insertion of stent   5. Renal transplant recipient    PLAN:    Hypotension Chest pressure History of CAD with prior stent S/P renal transplant 08/18/20, follows at [facility] Type II diabetes -concerning, nonspecific symptoms. Given history of CAD, will get stress echocardiogram to rule out ischemia as etiology. Will also allow [facility] to determine his functional capacity  Cardiac risk counseling and prevention recommendations: -recommend heart healthy/Mediterranean diet, with whole grains, fruits, vegetable, fish, lean meats, nuts, and olive oil. Limit salt. -recommend moderate walking, 3-5 times/week for 30-50 minutes each session. Aim  for at least 150 minutes.week. Goal should be pace of 3 miles/hours, or walking 1.5 miles in 30 minutes -recommend avoidance of tobacco products. Avoid excess alcohol.  Plan for follow up: 1 month.  [place], MD, PhD, [facility] [place]  [facility]    Medication Adjustments/Labs and Tests Ordered: Current medicines are reviewed at length with the patient today.  Concerns regarding medicines are outlined above.  Orders Placed This Encounter  Procedures   Cardiac Stress Test: Informed Consent Details: Physician/Practitioner Attestation; Transcribe to consent form and obtain patient signature   EKG 12-Lead   ECHOCARDIOGRAM STRESS TEST   No orders of the defined types were placed in this encounter.  Patient Instructions  Medication Instructions:  Your Physician recommend you continue on your current medication as directed.    *If you need a refill on your cardiac medications before your next appointment, please call your pharmacy*   Lab Work: None ordered today   Testing/Procedures: Your physician has requested that you have a stress echocardiogram. For further information please visit [email or web address]. Please follow instruction sheet as given. [place] [address]   Follow-Up: At [facility], you and your health needs are our priority.  As part of our continuing mission to provide you with exceptional heart care, we have created designated Provider Care Teams.  These Care Teams include your primary Cardiologist (physician) and Advanced Practice Providers (APPs -  Physician Assistants and Nurse Practitioners) who all work together to provide you with the care you need, when you need it.  We recommend signing up for the patient portal called "MyChart".  Sign up information is provided on this After Visit Summary.  MyChart is used to connect with patients for Virtual Visits (Telemedicine).  Patients are able to view lab/test results, encounter  notes, upcoming appointments, etc.  Non-urgent messages can be sent to your provider as well.   To learn more about what you can do with MyChart, go to [email or web address].    Your next appointment:   1 month(s)  The format for your next appointment:   In Person  Provider:   Buford Dresser, MD      [facility] [address], [place], [place] [address] Phone:  [phone]        You are scheduled for an Exercise Stress ECHO   Please arrive 15 minutes prior to your appointment time for registration and insurance purposes.  The test will take approximately 45 minutes to complete.  How to prepare for your Exercise Stress Test: Do bring a list of your current medications with you.  If not listed below, you may take your medications as normal. No tome carvedilol (Coreg) durante las 24 horas antes de la prueba. Traiga el medicamento a su cita, ya que es posible que tenga que tomarlo una vez que termine la prueba. Do wear comfortable clothes (no dresses or overalls) and walking shoes, tennis shoes preferred (no heels or open toed shoes are allowed) Do Not wear cologne, perfume, aftershave or lotions (deodorant is allowed). Please report to [place], [address] for your test.  If these instructions are not followed, your test will have to be rescheduled.  If you have questions or concerns about your appointment, you can call the Stress Lab at [phone].  If you cannot keep your appointment, please provide 24 hours notification to the Stress Lab, to avoid a possible $50 charge to your account           I,Breanna Adamick,acting as a scribe for Buford Dresser, MD.,have documented all relevant documentation on the behalf of Buford Dresser, MD,as directed by  Buford Dresser, MD while in the presence of Buford Dresser, MD.  I, Buford Dresser, MD, have reviewed all  documentation for this visit. The documentation on 03/19/22 for the exam, diagnosis, procedures, and orders are all accurate and complete.   Signed, Buford Dresser, MD PhD  02/27/2022     [facility]

## 2022-03-13 ENCOUNTER — Encounter (HOSPITAL_COMMUNITY): Payer: Self-pay | Admitting: *Deleted

## 2022-03-13 ENCOUNTER — Telehealth (HOSPITAL_COMMUNITY): Payer: Self-pay | Admitting: *Deleted

## 2022-03-13 NOTE — Telephone Encounter (Signed)
err

## 2022-03-20 ENCOUNTER — Ambulatory Visit (HOSPITAL_COMMUNITY): Payer: Medicare Other

## 2022-03-20 ENCOUNTER — Ambulatory Visit (HOSPITAL_COMMUNITY): Payer: Medicare Other | Attending: Cardiology

## 2022-03-20 DIAGNOSIS — R0789 Other chest pain: Secondary | ICD-10-CM | POA: Insufficient documentation

## 2022-03-20 DIAGNOSIS — I959 Hypotension, unspecified: Secondary | ICD-10-CM | POA: Diagnosis present

## 2022-03-20 MED ORDER — PERFLUTREN LIPID MICROSPHERE
1.0000 mL | INTRAVENOUS | Status: AC | PRN
  Administered 2022-03-20: 3 mL via INTRAVENOUS

## 2022-03-30 ENCOUNTER — Encounter (HOSPITAL_BASED_OUTPATIENT_CLINIC_OR_DEPARTMENT_OTHER): Payer: Self-pay | Admitting: Cardiology

## 2022-03-30 ENCOUNTER — Ambulatory Visit (INDEPENDENT_AMBULATORY_CARE_PROVIDER_SITE_OTHER): Payer: Medicare Other | Admitting: Cardiology

## 2022-03-30 VITALS — BP 146/62 | HR 76 | Ht 63.0 in | Wt 159.0 lb

## 2022-03-30 DIAGNOSIS — I10 Essential (primary) hypertension: Secondary | ICD-10-CM | POA: Diagnosis not present

## 2022-03-30 DIAGNOSIS — I2510 Atherosclerotic heart disease of native coronary artery without angina pectoris: Secondary | ICD-10-CM

## 2022-03-30 DIAGNOSIS — Z7189 Other specified counseling: Secondary | ICD-10-CM

## 2022-03-30 DIAGNOSIS — Z955 Presence of coronary angioplasty implant and graft: Secondary | ICD-10-CM | POA: Diagnosis not present

## 2022-03-30 DIAGNOSIS — Z940 Kidney transplant status: Secondary | ICD-10-CM

## 2022-03-30 NOTE — Progress Notes (Signed)
Cardiology Office Note:    Date:  03/30/2022   ID:  Stephen Lane, DOB 04/01/1961, MRN [id number]  PCP:  Program, [facility]  Cardiologist:  Buford Dresser, MD  Referring MD: Program, [place] Fa*   CC: Follow up for hypotension  History of Present Illness:    Stephen Lane is a 61 y.o. male with a hx of carotid stenosis, coronary atherosclerosis, essential hypertension, mixed hyperlipidemia, ESRD, type 2 diabetes, who is seen for follow up for hypotension.  He was last seen by me for consult for hypotension on 02/27/22. He has been experiencing hypotension since back in June 2023, seeing numbers around 80/60s. Likely in relation to his low blood pressures, he had been feeling dizziness and weakness. He noted severe fatigue as well. He also reports feeling episodes of chest pressure any increased thirst which would wake him from sleep around 4AM. He is fairly active, especially outside of the house. He had recently needed to stop more frequently to rest during activity. His low blood pressure normally occurs most frequently when he begins to perform activities.  Sometimes he would also experience shortness of breath, which resolves when he comes inside and drinks some water.  Today: History provided via Spanish medical interpreter today.  Patient states that overall he is doing very well. His shortness of breath and previous chest discomfort have resolved. His last episode occurred over 2 weeks ago. He was taking his BP medication every other day but since his symptoms have resolved he has been able to take the medication every day. He is unsure what may have caused his symptom improvement.  He denies any chest pain or shortness of breath with his recent stress testing. However, he felt somewhat tired at the end of the test. We reviewed that he did not reach the target heart rate during stress testing. He notes that he forgot to hold his BP  medications that morning.  His blood pressure has been around 135/78 for the last 2 weeks. He denies any low blood pressures in this time period.  He is followed routinely by his kidney transplant team.  He denies any palpitations or peripheral edema. No headaches, syncope, orthopnea, or PND.  Past Medical History:  Diagnosis Date   Carotid stenosis    a. Carotid US (05/2013):  1-39% bilateral ICA; brachial waveforms triphasic bilat; vertebrals with antegrade flow bilat; repeat 1 year   Cataract    Chronic high back pain    "where the lungs are located" (01/26/2015)   Coronary atherosclerosis    a. admx with [place] 11/13 => LHC 04/15/12: pLAD 50%, oD1 75% (small), pCFX 40% followed by mCFX 95%, pRCA 60%, mRCA 90%, EF 55-65%. PCI 04/15/12: [facility] to [facility] and DES to the [facility].   Daily headache    ESRD (end stage renal disease) on dialysis ([place])    "TTS; [facility]; [address] (07/18/2017)   Essential hypertension    Mixed hyperlipidemia    Pneumonia 01/2015   Type II diabetes mellitus [facility])     Past Surgical History:  Procedure Laterality Date   A/V FISTULAGRAM N/A 10/11/2018   Procedure: A/V FISTULAGRAM - Right Arm;  Surgeon: Elam Dutch, MD;  Location: [place] [facility];  Service: Cardiovascular;  Laterality: N/A;   AV FISTULA PLACEMENT Left 01/28/2015   Procedure: LEFT BRACHIAL-CEPHALIC ARTERIOVENOUS (AV) FISTULA CREATION;  Surgeon: Mal Misty, MD;  Location: [place];  Service: Vascular;  Laterality: Left;   AV  FISTULA PLACEMENT Right 06/12/2018   Procedure: ARTERIOVENOUS (AV) FISTULA CREATION;  Surgeon: Waynetta Sandy, MD;  Location: [place];  Service: Vascular;  Laterality: Right;   CATARACT EXTRACTION W/ INTRAOCULAR LENS IMPLANT Left    CORONARY ANGIOPLASTY WITH STENT PLACEMENT  04/15/2012   EYE SURGERY Right 2007   "blood behind eyeball"   INSERTION OF DIALYSIS CATHETER N/A 01/28/2015   Procedure: ULTRASOUND GUIDED INSERTION OF HEMODIALYSIS CATHETER RIGHT IJ;   Surgeon: Mal Misty, MD;  Location: [place];  Service: Vascular;  Laterality: N/A;   LEFT HEART CATHETERIZATION WITH CORONARY ANGIOGRAM N/A 04/15/2012   Procedure: LEFT HEART CATHETERIZATION WITH CORONARY ANGIOGRAM;  Surgeon: Larey Dresser, MD;  Location: [facility] CATH LAB;  Service: Cardiovascular;  Laterality: N/A;   LIGATION OF COMPETING BRANCHES OF ARTERIOVENOUS FISTULA Left 03/10/2015   Procedure: LIGATION OF COMPETING BRANCHES OF LEFT ARM BRACHIOCEPHALIC ARTERIOVENOUS FISTULA;  Surgeon: Mal Misty, MD;  Location: [place];  Service: Vascular;  Laterality: Left;   LIGATION OF COMPETING BRANCHES OF ARTERIOVENOUS FISTULA Right 10/18/2018   Procedure: SIDE BRANCH LIGATION OF ARTERIOVENOUS FISTULA RIGHT ARM;  Surgeon: Elam Dutch, MD;  Location: [place];  Service: Vascular;  Laterality: Right;   PERCUTANEOUS CORONARY STENT INTERVENTION (PCI-S) N/A 04/15/2012   Procedure: PERCUTANEOUS CORONARY STENT INTERVENTION (PCI-S);  Surgeon: Sherren Mocha, MD;  Location: [facility] C[facility] LAB;  Service: Cardiovascular;  Laterality: N/A;   PERIPHERAL VASCULAR BALLOON ANGIOPLASTY  10/11/2018   Procedure: PERIPHERAL VASCULAR BALLOON ANGIOPLASTY;  Surgeon: Elam Dutch, MD;  Location: [place] CV LAB;  Service: Cardiovascular;;  Right arm fistula    Current Medications: Current Outpatient Medications on File Prior to Visit  Medication Sig   amLODipine (NORVASC) 10 MG tablet Take 10 mg by mouth daily.   carvedilol (COREG) 3.125 MG tablet Take 1 tablet by mouth 2 (two) times daily.   Insulin Aspart FlexPen (NOVOLOG) 100 UNIT/ML Patient reports taking 5 Units with every meal   Insulin Glargine w/ Trans Port 100 UNIT/ML SOPN Inject 12 Units into the skin at bedtime.   Mycophenolate Sodium (MYFORTIC PO) Take by mouth.   pantoprazole (PROTONIX) 40 MG tablet Take 40 mg by mouth as needed.   predniSONE (DELTASONE) 5 MG tablet Take 1 tablet by mouth daily.   sulfamethoxazole-trimethoprim (BACTRIM) 400-80 MG tablet Take  1 tablet by mouth 3 (three) times a week.   tacrolimus (PROGRAF) 1 MG capsule Take 2 capsules by mouth in the morning and 1 capsule in the evening   No current facility-administered medications on file prior to visit.     Allergies:   Patient has no known allergies.   Social History   Tobacco Use   Smoking status: Former    Packs/day: 0.25    Years: 10.00    Total pack years: 2.50    Types: Cigarettes   Smokeless tobacco: Never   Tobacco comments:    "quit smoking cigarettes in the 1980's"  Vaping Use   Vaping Use: Never used  Substance Use Topics   Alcohol use: Yes    Comment: "quit drinking in ~ 2010"   Drug use: No    Family History: family history includes Diabetes in his brother; Hypertension in his mother and sister; Other in his father. There is no history of Colon cancer, Esophageal cancer, Stomach cancer, or Rectal cancer.  ROS:   Please see the history of present illness.   Additional pertinent ROS otherwise negative.   EKGs/Labs/Other Studies Reviewed:    The following  studies were reviewed today:  Echo stress test 03/20/2022:  1. Negative ECG response to exercise but lots of artifact at peak stress.   2. Study is negative for ischemia but peak HR with stress was 121 bpm  which is only 77% predicted max and stress images captured at even lower  HR of 91 bpm.   3. This is a negative stress echocardiogram for ischemia.   4. This is an indeterminate risk study.   Echo 01/26/2015: Impressions:  - Hypokinesis of the distal lateral wall with overall low normal LV    function; grade 2 diastolic dysfunction with elevated LV filling    pressure; trace AI and MR; mild TR; moderately elevated pulmonary    pressure.   Carotid Duplex 05/30/2013: Mild heterogeneous plaque bilaterally. 1-39% bilateral ICA stenosis. Brachial waveforms are triphasic bilaterally. Patent vertebral arteries with antegrade flow.  EKG:  EKG is personally reviewed.   02/27/22: sinus  rhythm with first degree AV block at 66 bpm, q waves inferiorly  Recent Labs: No results found for requested labs within last 365 days.  Recent Lipid Panel    Component Value Date/Time   CHOL 191 07/18/2017 0422   TRIG 215 (H) 07/18/2017 0422   HDL 38 (L) 07/18/2017 0422   CHOLHDL 5.0 07/18/2017 0422   VLDL 43 (H) 07/18/2017 0422   LDLCALC 110 (H) 07/18/2017 0422   LDLDIRECT 122 09/27/2015 1147    Physical Exam:    VS:  BP (!) 146/62   Pulse 76   Ht $R'5\' 3"'xL$  (1.6 m)   Wt 159 lb (72.1 kg)   BMI 28.17 kg/m     Wt Readings from Last 3 Encounters:  03/30/22 159 lb (72.1 kg)  02/27/22 160 lb (72.6 kg)  06/04/19 166 lb (75.3 kg)    GEN: Well nourished, well developed in no acute distress HEENT: Normal, moist mucous membranes NECK: No JVD CARDIAC: regular rhythm, normal S1 and S2, no rubs or gallops. No murmur. VASCULAR: Radial and DP pulses 2+ bilaterally. No carotid bruits RESPIRATORY:  Clear to auscultation without rales, wheezing or rhonchi  ABDOMEN: Soft, non-tender, non-distended MUSCULOSKELETAL:  Ambulates independently SKIN: Warm and dry, no edema NEUROLOGIC:  Alert and oriented x 3. No focal neuro deficits noted. PSYCHIATRIC:  Normal affect    ASSESSMENT:    1. Atherosclerosis of native coronary artery of native heart without angina pectoris   2. History of coronary angioplasty with insertion of stent   3. Renal transplant recipient   4. Essential hypertension   5. Cardiac risk counseling   6. Counseling on health promotion and disease prevention     PLAN:    Hypotension, Chest pressure: resolved History of hypertension History of CAD with prior stent S/P renal transplant 08/18/20, follows at [facility] Type II diabetes -stress echo reassuring, though only reached 77% APMHR -no longer having hypotension or chest pressure -he is back on amlodipine and carvedilol, tolerating with home blood pressures showing improved control -follows closely with his transplant  nephrology team  Cardiac risk counseling and prevention recommendations: -recommend heart healthy/Mediterranean diet, with whole grains, fruits, vegetable, fish, lean meats, nuts, and olive oil. Limit salt. -recommend moderate walking, 3-5 times/week for 30-50 minutes each session. Aim for at least 150 minutes.week. Goal should be pace of 3 miles/hours, or walking 1.5 miles in 30 minutes -recommend avoidance of tobacco products. Avoid excess alcohol.  Plan for follow up: 1 year or sooner as needed  Buford Dresser, MD, PhD, [facility]    Medication Adjustments/Labs and Tests Ordered: Current medicines are reviewed at length with the patient today.  Concerns regarding medicines are outlined above.  No orders of the defined types were placed in this encounter.  No orders of the defined types were placed in this encounter.  Patient Instructions  Medication Instructions:  Your Physician recommend you continue on your current medication as directed.    *If you need a refill on your cardiac medications before your next appointment, please call your pharmacy*   Lab Work: None ordered today   Testing/Procedures: None ordered today   Follow-Up: At [facility], you and your health needs are our priority.  As part of our continuing mission to provide you with exceptional heart care, we have created designated Provider Care Teams.  These Care Teams include your primary Cardiologist (physician) and Advanced Practice Providers (APPs -  Physician Assistants and Nurse Practitioners) who all work together to provide you with the care you need, when you need it.  We recommend signing up for the patient portal called "MyChart".  Sign up information is provided on this After Visit Summary.  MyChart is used to connect with patients for Virtual Visits (Telemedicine).  Patients are able to view lab/test results, encounter notes, upcoming appointments, etc.   Non-urgent messages can be sent to your provider as well.   To learn more about what you can do with MyChart, go to [email or web address].    Your next appointment:   1 year(s)  The format for your next appointment:   In Person  Provider:   Buford Dresser, MD              I,Alexis Herring,acting as a scribe for Buford Dresser, MD.,have documented all relevant documentation on the behalf of Buford Dresser, MD,as directed by  Buford Dresser, MD while in the presence of Buford Dresser, MD.  I, Buford Dresser, MD, have reviewed all documentation for this visit. The documentation on 06/27/22 for the exam, diagnosis, procedures, and orders are all accurate and complete.    Signed, Buford Dresser, MD PhD 03/30/2022     [place]

## 2022-03-30 NOTE — Patient Instructions (Signed)
Medication Instructions:  Your Physician recommend you continue on your current medication as directed.    *If you need a refill on your cardiac medications before your next appointment, please call your pharmacy*   Lab Work: None ordered today   Testing/Procedures: None ordered today   Follow-Up: At [facility], you and your health needs are our priority.  As part of our continuing mission to provide you with exceptional heart care, we have created designated Provider Care Teams.  These Care Teams include your primary Cardiologist (physician) and Advanced Practice Providers (APPs -  Physician Assistants and Nurse Practitioners) who all work together to provide you with the care you need, when you need it.  We recommend signing up for the patient portal called "MyChart".  Sign up information is provided on this After Visit Summary.  MyChart is used to connect with patients for Virtual Visits (Telemedicine).  Patients are able to view lab/test results, encounter notes, upcoming appointments, etc.  Non-urgent messages can be sent to your provider as well.   To learn more about what you can do with MyChart, go to [email or web address].    Your next appointment:   1 year(s)  The format for your next appointment:   In Person  Provider:   Buford Dresser, MD

## 2022-06-27 ENCOUNTER — Encounter (HOSPITAL_BASED_OUTPATIENT_CLINIC_OR_DEPARTMENT_OTHER): Payer: Self-pay | Admitting: Cardiology
